# Patient Record
Sex: Male | Born: 1975 | Race: White | Hispanic: No | Marital: Single | Smoking: Current every day smoker
Health system: Southern US, Community
[De-identification: ages and names within clinical notes are randomized; demographics above are authoritative.]

## PROBLEM LIST (undated history)

## (undated) DIAGNOSIS — M47812 Spondylosis without myelopathy or radiculopathy, cervical region: Secondary | ICD-10-CM

## (undated) DIAGNOSIS — J45909 Unspecified asthma, uncomplicated: Secondary | ICD-10-CM

## (undated) DIAGNOSIS — B019 Varicella without complication: Secondary | ICD-10-CM

## (undated) DIAGNOSIS — R51 Headache: Secondary | ICD-10-CM

## (undated) DIAGNOSIS — K219 Gastro-esophageal reflux disease without esophagitis: Secondary | ICD-10-CM

## (undated) DIAGNOSIS — K8000 Calculus of gallbladder with acute cholecystitis without obstruction: Secondary | ICD-10-CM

## (undated) DIAGNOSIS — G43909 Migraine, unspecified, not intractable, without status migrainosus: Secondary | ICD-10-CM

## (undated) DIAGNOSIS — R519 Headache, unspecified: Secondary | ICD-10-CM

## (undated) DIAGNOSIS — M549 Dorsalgia, unspecified: Secondary | ICD-10-CM

## (undated) DIAGNOSIS — G8929 Other chronic pain: Secondary | ICD-10-CM

## (undated) HISTORY — DX: Headache, unspecified: R51.9

## (undated) HISTORY — DX: Dorsalgia, unspecified: M54.9

## (undated) HISTORY — DX: Unspecified asthma, uncomplicated: J45.909

## (undated) HISTORY — DX: Headache: R51

## (undated) HISTORY — DX: Spondylosis without myelopathy or radiculopathy, cervical region: M47.812

## (undated) HISTORY — DX: Migraine, unspecified, not intractable, without status migrainosus: G43.909

## (undated) HISTORY — DX: Calculus of gallbladder with acute cholecystitis without obstruction: K80.00

## (undated) HISTORY — DX: Varicella without complication: B01.9

## (undated) HISTORY — PX: NO PAST SURGERIES: SHX2092

## (undated) HISTORY — DX: Other chronic pain: G89.29

---

## 2005-04-18 ENCOUNTER — Emergency Department (HOSPITAL_COMMUNITY): Admission: EM | Admit: 2005-04-18 | Discharge: 2005-04-18 | Payer: Self-pay | Admitting: Emergency Medicine

## 2005-12-06 ENCOUNTER — Emergency Department: Payer: Self-pay | Admitting: Emergency Medicine

## 2006-06-19 ENCOUNTER — Emergency Department: Payer: Self-pay | Admitting: Emergency Medicine

## 2006-11-29 ENCOUNTER — Ambulatory Visit: Payer: Self-pay | Admitting: Internal Medicine

## 2006-12-04 ENCOUNTER — Emergency Department: Payer: Self-pay | Admitting: Unknown Physician Specialty

## 2008-05-28 ENCOUNTER — Other Ambulatory Visit: Payer: Self-pay

## 2008-05-28 ENCOUNTER — Emergency Department: Payer: Self-pay | Admitting: Unknown Physician Specialty

## 2008-05-30 ENCOUNTER — Emergency Department: Payer: Self-pay | Admitting: Emergency Medicine

## 2009-08-05 ENCOUNTER — Emergency Department: Payer: Self-pay | Admitting: Emergency Medicine

## 2010-04-14 ENCOUNTER — Emergency Department (HOSPITAL_COMMUNITY): Admission: EM | Admit: 2010-04-14 | Discharge: 2010-04-14 | Payer: Self-pay | Admitting: Emergency Medicine

## 2010-04-27 ENCOUNTER — Emergency Department: Payer: Self-pay | Admitting: Emergency Medicine

## 2010-05-01 ENCOUNTER — Emergency Department: Payer: Self-pay | Admitting: Internal Medicine

## 2010-07-15 ENCOUNTER — Emergency Department: Payer: Self-pay | Admitting: Emergency Medicine

## 2010-08-18 ENCOUNTER — Emergency Department: Payer: Self-pay | Admitting: Emergency Medicine

## 2011-05-29 ENCOUNTER — Emergency Department: Payer: Self-pay | Admitting: Emergency Medicine

## 2011-06-05 ENCOUNTER — Emergency Department: Payer: Self-pay | Admitting: Internal Medicine

## 2015-11-18 ENCOUNTER — Emergency Department
Admission: EM | Admit: 2015-11-18 | Discharge: 2015-11-18 | Disposition: A | Discharge status: Home / Self Care | Payer: Self-pay | Attending: Emergency Medicine | Admitting: Emergency Medicine

## 2015-11-18 DIAGNOSIS — K047 Periapical abscess without sinus: Secondary | ICD-10-CM | POA: Insufficient documentation

## 2015-11-18 DIAGNOSIS — K029 Dental caries, unspecified: Secondary | ICD-10-CM | POA: Insufficient documentation

## 2015-11-18 MED ORDER — OXYCODONE-ACETAMINOPHEN 5-325 MG PO TABS
1.0000 | ORAL_TABLET | Freq: Once | ORAL | Status: AC
  Administered 2015-11-18: 1 via ORAL
  Filled 2015-11-18: qty 1

## 2015-11-18 MED ORDER — CEPHALEXIN 500 MG PO CAPS
500.0000 mg | ORAL_CAPSULE | Freq: Once | ORAL | Status: AC
  Administered 2015-11-18: 500 mg via ORAL
  Filled 2015-11-18: qty 1

## 2015-11-18 MED ORDER — CEPHALEXIN 500 MG PO CAPS: 500.0000 mg | ORAL_CAPSULE | Freq: Two times a day (BID) | ORAL | Status: AC

## 2015-11-18 MED ORDER — OXYCODONE-ACETAMINOPHEN 5-325 MG PO TABS: 1.0000 | ORAL_TABLET | ORAL | Status: DC | PRN

## 2015-11-18 NOTE — Discharge Instructions (Signed)

## 2015-11-18 NOTE — ED Notes (Signed)
MD at bedside. 

## 2015-11-18 NOTE — ED Notes (Signed)
Tooth ache

## 2015-11-18 NOTE — ED Notes (Signed)
Pt alert and oriented X4, active, cooperative, pt in NAD. RR even and unlabored, color WNL.  Pt informed to return if any life threatening symptoms occur.   

## 2015-11-18 NOTE — ED Provider Notes (Signed)
[facility] Emergency Department Provider Note  ____________________________________________  Time seen: 3:15 AM  I have reviewed the triage vital signs and the nursing notes.   HISTORY  Chief Complaint Dental Pain     HPI Zachary Khan is a 40 y.o. male presents with toothache x few days patient states that he feels as though his face is now swelling as a result. Patient denies any fever no difficulty swallowing no neck pain    Past medical history None There are no active problems to display for this patient.   Past Surgical History None  Current Outpatient Rx  Name  Route  Sig  Dispense  Refill  . cephALEXin (KEFLEX) 500 MG capsule   Oral   Take 1 capsule (500 mg total) by mouth 2 (two) times daily.   20 capsule   0   . oxyCODONE-acetaminophen (PERCOCET/ROXICET) 5-325 MG tablet   Oral   Take 1 tablet by mouth every 4 (four) hours as needed for severe pain.   15 tablet   0     Allergies Sulfa antibiotics  No family history on file.  Social History Social History  Substance Use Topics  . Smoking status: Not on file  . Smokeless tobacco: Not on file  . Alcohol Use: Not on file    Review of Systems  Constitutional: Negative for fever. Eyes: Negative for visual changes. ENT: Negative for sore throat. Positive for toothache Cardiovascular: Negative for chest pain. Respiratory: Negative for shortness of breath. Gastrointestinal: Negative for abdominal pain, vomiting and diarrhea. Genitourinary: Negative for dysuria. Musculoskeletal: Negative for back pain. Skin: Negative for rash. Neurological: Negative for headaches, focal weakness or numbness.   10-point ROS otherwise negative.  ____________________________________________   PHYSICAL EXAM:  VITAL SIGNS: ED Triage Vitals  Enc Vitals Group     BP 11/18/15 0157 165/105 mmHg     Pulse Rate 11/18/15 0157 90     Resp 11/18/15 0157 18     Temp 11/18/15 0157 98.6 F (37  C)     Temp Source 11/18/15 0157 Oral     SpO2 11/18/15 0157 96 %     Weight 11/18/15 0157 190 lb (86.183 kg)     Height 11/18/15 0157  (1.753 m)     Head Cir --      Peak Flow --      Pain Score 11/18/15 0157 9     Pain Loc --      Pain Edu? --      Excl. in GC? --      Constitutional: Alert and oriented. Well appearing and in no distress. Eyes: Conjunctivae are normal. PERRL. Normal extraocular movements. ENT   Head: Normocephalic and atraumatic.   Nose: No congestion/rhinnorhea.   Mouth/Throat: Mucous membranes are moist. Positive for dental caries   Neck: No stridor. Hematological/Lymphatic/Immunilogical: No cervical lymphadenopathy. Cardiovascular: Normal rate, regular rhythm. Normal and symmetric distal pulses are present in all extremities. No murmurs, rubs, or gallops. Respiratory: Normal respiratory effort without tachypnea nor retractions. Breath sounds are clear and equal bilaterally. No wheezes/rales/rhonchi. Gastrointestinal: Soft and nontender. No distention. There is no CVA tenderness. Genitourinary: deferred Musculoskeletal: Nontender with normal range of motion in all extremities. No joint effusions.  No lower extremity tenderness nor edema. Neurologic:  Normal speech and language. No gross focal neurologic deficits are appreciated. Speech is normal.  Skin:  Skin is warm, dry and intact. No rash noted.  _____     INITIAL IMPRESSION / ASSESSMENT  AND PLAN / ED COURSE  Pertinent labs & imaging results that were available during my care of the patient were reviewed by me and considered in my medical decision making (see chart for details).    ____________________________________________   FINAL CLINICAL IMPRESSION(S) / ED DIAGNOSES  Final diagnoses:  Dental abscess      Darci Current, MD 11/18/15 [phone]

## 2016-02-07 ENCOUNTER — Emergency Department
Admission: EM | Admit: 2016-02-07 | Discharge: 2016-02-07 | Disposition: A | Discharge status: Home / Self Care | Payer: Self-pay | Attending: Emergency Medicine | Admitting: Emergency Medicine

## 2016-02-07 ENCOUNTER — Emergency Department: Payer: Self-pay

## 2016-02-07 DIAGNOSIS — N50811 Right testicular pain: Secondary | ICD-10-CM | POA: Insufficient documentation

## 2016-02-07 DIAGNOSIS — N5089 Other specified disorders of the male genital organs: Secondary | ICD-10-CM

## 2016-02-07 LAB — URINALYSIS COMPLETE WITH MICROSCOPIC (ARMC ONLY)
Bilirubin Urine: NEGATIVE
GLUCOSE, UA: NEGATIVE mg/dL
Ketones, ur: NEGATIVE mg/dL
Leukocytes, UA: NEGATIVE
Nitrite: NEGATIVE
Protein, ur: NEGATIVE mg/dL
Specific Gravity, Urine: 1.013 (ref 1.005–1.030)
Squamous Epithelial / LPF: NONE SEEN
pH: 7 (ref 5.0–8.0)

## 2016-02-07 MED ORDER — HYDROCODONE-ACETAMINOPHEN 5-325 MG PO TABS: 1.0000 | ORAL_TABLET | Freq: Four times a day (QID) | ORAL | Status: DC | PRN

## 2016-02-07 MED ORDER — ONDANSETRON 4 MG PO TBDP
4.0000 mg | ORAL_TABLET | Freq: Once | ORAL | Status: AC
  Administered 2016-02-07: 4 mg via ORAL
  Filled 2016-02-07: qty 1

## 2016-02-07 MED ORDER — HYDROMORPHONE HCL 1 MG/ML IJ SOLN
1.0000 mg | Freq: Once | INTRAMUSCULAR | Status: AC
  Administered 2016-02-07: 1 mg via INTRAMUSCULAR
  Filled 2016-02-07: qty 1

## 2016-02-07 MED ORDER — DOXYCYCLINE HYCLATE 50 MG PO CAPS: 50.0000 mg | ORAL_CAPSULE | Freq: Two times a day (BID) | ORAL | Status: DC

## 2016-02-07 NOTE — ED Provider Notes (Signed)
[facility] Emergency Department Provider Note  __________________________________________  Time seen: Approximately 10:09 AM  I have reviewed the triage vital signs and the nursing notes.   HISTORY  Chief Complaint Testicle Pain  HPI Zachary Khan is a 40 y.o. male who is complaining of severe pain in his right testicle that started Friday evening and this gotten progressively worse. Patient states that he has had no injury and no associated abdominal pain as well. Patient started being concerned that he may have a testicular torsion. Patient has not had any significant discharge or urinary symptoms. He also denies any fever or chills or nausea or vomiting. Patient states his pain on scale of 0-10 is a 10. Patient also has no significant swelling to the testicle.   No past medical history on file.  There are no active problems to display for this patient.   No past surgical history on file.  Current Outpatient Rx  Name  Route  Sig  Dispense  Refill  . oxyCODONE-acetaminophen (PERCOCET/ROXICET) 5-325 MG tablet   Oral   Take 1 tablet by mouth every 4 (four) hours as needed for severe pain.   15 tablet   0     Allergies Sulfa antibiotics  No family history on file.  Social History Social History  Substance Use Topics  . Smoking status: Not on file  . Smokeless tobacco: Not on file  . Alcohol Use: Not on file    Review of Systems Constitutional: No fever/chills Eyes: No visual changes. ENT: No sore throat. Cardiovascular: Denies chest pain. Respiratory: Denies shortness of breath. Gastrointestinal: No abdominal pain.  No nausea, no vomiting.  No diarrhea.  No constipation. Genitourinary: Negative for dysuria.Positive for severe pain in his right testicle. Musculoskeletal: Negative for back pain. Skin: Negative for rash. Neurological: Negative for headaches, focal weakness or numbness.  10-point ROS otherwise  negative.  ____________________________________________   PHYSICAL EXAM:  VITAL SIGNS: ED Triage Vitals  Enc Vitals Group     BP 02/07/16 0911 132/82 mmHg     Pulse Rate 02/07/16 0911 84     Resp 02/07/16 0911 20     Temp 02/07/16 0911 97.8 F (36.6 C)     Temp Source 02/07/16 0911 Oral     SpO2 02/07/16 0911 99 %     Weight 02/07/16 0911 181 lb (82.101 kg)     Height 02/07/16 0911  (1.753 m)     Head Cir --      Peak Flow --      Pain Score 02/07/16 0913 7     Pain Loc --      Pain Edu? --      Excl. in GC? --     Constitutional: Alert and oriented. Well appearing and in Mild acute distress secondary to his pain. Eyes: Conjunctivae are normal. PERRL. EOMI. Head: Atraumatic. Nose: No congestion/rhinnorhea. Mouth/Throat: Mucous membranes are moist.  Oropharynx non-erythematous. Neck: No stridor.   Cardiovascular: Normal rate, regular rhythm. Grossly normal heart sounds.  Good peripheral circulation. Respiratory: Normal respiratory effort.  No retractions. Lungs CTAB. Gastrointestinal: Soft and nontender. No distention. No abdominal bruits. No CVA tenderness. Genitourinary: Patient has tenderness palpation along his right testicle and epididymis, but no significant swelling, no inguinal lymphadenopathy, no redness, or any other acute findings. Musculoskeletal: No lower extremity tenderness nor edema.  No joint effusions. Neurologic:  Normal speech and language. No gross focal neurologic deficits are appreciated. No gait instability. Skin:  Skin is warm,  dry and intact. No rash noted. Psychiatric: Mood and affect are normal. Speech and behavior are normal.  ____________________________________________   LABS (all labs ordered are listed, but only abnormal results are displayed)  Labs Reviewed  URINALYSIS COMPLETEWITH MICROSCOPIC ([facility] ONLY) - Abnormal; Notable for the following:    Color, Urine YELLOW (*)    APPearance CLEAR (*)    Hgb urine dipstick 1+ (*)     Bacteria, UA RARE (*)    All other components within normal limits   ____________________________________________  EKG   ____________________________________________  RADIOLOGY  [place] Scrotum  02/07/2016  CLINICAL DATA:  Testicular swelling for 1.5 days. EXAM: SCROTAL ULTRASOUND DOPPLER ULTRASOUND OF THE TESTICLES TECHNIQUE: Complete ultrasound examination of the testicles, epididymis, and other scrotal structures was performed. Color and spectral Doppler ultrasound were also utilized to evaluate blood flow to the testicles. COMPARISON:  CT of 08/18/2010 FINDINGS: Right testicle Measurements: 4.4 x 2.8 x 3.2 cm.  Normal in morphology. Left testicle Measurements:  4.5 x 2.4 x 3.3 cm.  Normal in morphology. Right epididymis:  Normal in size and appearance. Left epididymis:  Normal in size and appearance. Hydrocele:  Small bilateral hydroceles, likely physiologic. Varicocele:  Absent Pulsed Doppler interrogation of both testes demonstrates normal low resistance arterial and venous waveforms bilaterally. Normal and symmetric color signal bilaterally. IMPRESSION: 1. No evidence of testicular torsion or explanation for swelling. 2. Small bilateral hydroceles, favored to be physiologic. Electronically Signed   By: Jeronimo GreavesKyle  Talbot M.D.   On: 02/07/2016 10:43   [place] Art/ven Flow Abd Pelv Doppler  02/07/2016  CLINICAL DATA:  Testicular swelling for 1.5 days. EXAM: SCROTAL ULTRASOUND DOPPLER ULTRASOUND OF THE TESTICLES TECHNIQUE: Complete ultrasound examination of the testicles, epididymis, and other scrotal structures was performed. Color and spectral Doppler ultrasound were also utilized to evaluate blood flow to the testicles. COMPARISON:  CT of 08/18/2010 FINDINGS: Right testicle Measurements: 4.4 x 2.8 x 3.2 cm.  Normal in morphology. Left testicle Measurements:  4.5 x 2.4 x 3.3 cm.  Normal in morphology. Right epididymis:  Normal in size and appearance. Left epididymis:  Normal in size and appearance. Hydrocele:   Small bilateral hydroceles, likely physiologic. Varicocele:  Absent Pulsed Doppler interrogation of both testes demonstrates normal low resistance arterial and venous waveforms bilaterally. Normal and symmetric color signal bilaterally. IMPRESSION: 1. No evidence of testicular torsion or explanation for swelling. 2. Small bilateral hydroceles, favored to be physiologic. Electronically Signed   By: Jeronimo GreavesKyle  Talbot M.D.   On: 02/07/2016 10:43    ____________________________________________   PROCEDURES  Procedure(s) performed: None  Critical Care performed: No  ____________________________________________   INITIAL IMPRESSION / ASSESSMENT AND PLAN / ED COURSE  Pertinent labs & imaging results that were available during my care of the patient were reviewed by me and considered in my medical decision making (see chart for details).  11:59 AM Patient will be given a shot for pain prior to discharge. Patient has small bilateral hydroceles but no other acute findings. Patient will be discharged home on doxycycline and Vicodin for pain and possible infection that may not show that on ultrasound. Patient is going to be referred to urology, Dr. Annabell HowellsWrenn, for follow-up this week. ____________________________________________   FINAL CLINICAL IMPRESSION(S) / ED DIAGNOSES  Final diagnoses:  Right testicular pain      Leona CarryLinda M Belal Scallon, MD 02/07/16 1159

## 2016-02-07 NOTE — ED Notes (Addendum)
Pain and swelling to right testicle since Friday. Pt denies injury. Denies fever.

## 2016-02-07 NOTE — ED Notes (Signed)
Pt verbalized understanding of discharge instructions. NAD at this time. 

## 2016-02-07 NOTE — ED Notes (Addendum)
Patient transported to Ultrasound 

## 2016-05-23 ENCOUNTER — Emergency Department
Admission: EM | Admit: 2016-05-23 | Discharge: 2016-05-23 | Disposition: A | Discharge status: Home / Self Care | Payer: PRIVATE HEALTH INSURANCE | Attending: Emergency Medicine | Admitting: Emergency Medicine

## 2016-05-23 ENCOUNTER — Emergency Department: Payer: PRIVATE HEALTH INSURANCE

## 2016-05-23 DIAGNOSIS — M545 Low back pain: Secondary | ICD-10-CM | POA: Diagnosis present

## 2016-05-23 DIAGNOSIS — X58XXXA Exposure to other specified factors, initial encounter: Secondary | ICD-10-CM | POA: Diagnosis not present

## 2016-05-23 DIAGNOSIS — S161XXA Strain of muscle, fascia and tendon at neck level, initial encounter: Secondary | ICD-10-CM | POA: Insufficient documentation

## 2016-05-23 DIAGNOSIS — Y939 Activity, unspecified: Secondary | ICD-10-CM | POA: Insufficient documentation

## 2016-05-23 DIAGNOSIS — Y999 Unspecified external cause status: Secondary | ICD-10-CM | POA: Insufficient documentation

## 2016-05-23 DIAGNOSIS — S39012A Strain of muscle, fascia and tendon of lower back, initial encounter: Secondary | ICD-10-CM | POA: Diagnosis not present

## 2016-05-23 DIAGNOSIS — Y929 Unspecified place or not applicable: Secondary | ICD-10-CM | POA: Insufficient documentation

## 2016-05-23 DIAGNOSIS — T148XXA Other injury of unspecified body region, initial encounter: Secondary | ICD-10-CM

## 2016-05-23 MED ORDER — DIAZEPAM 5 MG/ML IJ SOLN
5.0000 mg | Freq: Once | INTRAMUSCULAR | Status: AC
  Administered 2016-05-23: 5 mg via INTRAMUSCULAR
  Filled 2016-05-23: qty 2

## 2016-05-23 MED ORDER — NAPROXEN 500 MG PO TABS: 500.0000 mg | ORAL_TABLET | Freq: Two times a day (BID) | ORAL | 0 refills | Status: DC

## 2016-05-23 MED ORDER — METHOCARBAMOL 750 MG PO TABS: 750.0000 mg | ORAL_TABLET | Freq: Four times a day (QID) | ORAL | 0 refills | Status: DC

## 2016-05-23 MED ORDER — OXYCODONE-ACETAMINOPHEN 5-325 MG PO TABS
2.0000 | ORAL_TABLET | Freq: Once | ORAL | Status: AC
  Administered 2016-05-23: 2 via ORAL
  Filled 2016-05-23: qty 2

## 2016-05-23 MED ORDER — KETOROLAC TROMETHAMINE 60 MG/2ML IM SOLN
60.0000 mg | Freq: Once | INTRAMUSCULAR | Status: AC
  Administered 2016-05-23: 60 mg via INTRAMUSCULAR
  Filled 2016-05-23: qty 2

## 2016-05-23 NOTE — ED Provider Notes (Signed)
[facility] Emergency Department Provider Note  ____________________________________________  Time seen: Approximately 2:31 PM  I have reviewed the triage vital signs and the nursing notes.   HISTORY  Chief Complaint Back Pain    HPI Zachary Khan is a 40 y.o. male who presents for evaluation of low back pain and cervical neck pain. Patient denies any trauma. States that the pain has followed a migraine that he had earlier last week 6 pain radiates down his right arm and down his right leg. Describes pain as 10 over 10 with no relief.   No past medical history on file.  There are no active problems to display for this patient.   No past surgical history on file.  Prior to Admission medications   Medication Sig Start Date End Date Taking? Authorizing Provider  methocarbamol (ROBAXIN) 750 MG tablet Take 1 tablet (750 mg total) by mouth 4 (four) times daily. 05/23/16   Evangeline Dakin, PA-C  naproxen (NAPROSYN) 500 MG tablet Take 1 tablet (500 mg total) by mouth 2 (two) times daily with a meal. 05/23/16   Evangeline Dakin, PA-C    Allergies Sulfa antibiotics  No family history on file.  Social History Social History  Substance Use Topics  . Smoking status: Not on file  . Smokeless tobacco: Not on file  . Alcohol use Not on file    Review of Systems Constitutional: No fever/chills Cardiovascular: Denies chest pain. Respiratory: Denies shortness of breath. Genitourinary: Negative for dysuria. Musculoskeletal: Positive for cervical neck pain and positive for lumbar back pain. Skin: Negative for rash. Neurological: Negative for headaches, focal weakness or numbness.  10-point ROS otherwise negative.  ____________________________________________   PHYSICAL EXAM:  VITAL SIGNS: ED Triage Vitals  Enc Vitals Group     BP 05/23/16 1417 134/87     Pulse Rate 05/23/16 1417 81     Resp 05/23/16 1417 17     Temp 05/23/16 1417 97.8 F (36.6 C)     Temp Source 05/23/16 1417 Oral     SpO2 05/23/16 1417 96 %     Weight 05/23/16 1417 180 lb (81.6 kg)     Height 05/23/16 1417  (1.753 m)     Head Circumference --      Peak Flow --      Pain Score 05/23/16 1420 8     Pain Loc --      Pain Edu? --      Excl. in GC? --     Constitutional: Alert and oriented. Well appearing and in no acute distress. Mouth/Throat: Mucous membranes are moist.  Oropharynx non-erythematous. Neck: No stridor.Supple full range of motion point tenderness noted to the lower cervical spine area.   Cardiovascular: Normal rate, regular rhythm. Grossly normal heart sounds.  Good peripheral circulation. Respiratory: Normal respiratory effort.  No retractions. Lungs CTAB. Gastrointestinal: Soft and nontender. No distention. No abdominal bruits. No CVA tenderness. Musculoskeletal: No lower extremity tenderness nor edema.  No joint effusions. Neurologic:  Normal speech and language. No gross focal neurologic deficits are appreciated. No gait instability. Skin:  Skin is warm, dry and intact. No rash noted. Psychiatric: Mood and affect are normal. Speech and behavior are normal.  ____________________________________________   LABS (all labs ordered are listed, but only abnormal results are displayed)  Labs Reviewed - No data to display ____________________________________________  EKG   ____________________________________________  RADIOLOGY   ____________________________________________   PROCEDURES  Procedure(s) performed: None  Critical Care performed: No  ____________________________________________   INITIAL IMPRESSION / ASSESSMENT AND PLAN / ED COURSE  Pertinent labs & imaging results that were available during my care of the patient were reviewed by me and considered in my medical decision making (see chart for details).  Acute cervical and lumbar strain. Rx given for Robaxin 750 and Naprosyn 500. Patient follow-up PCP or return to ER  with any worsening symptomology. Work excuse 24 hours given.  Clinical Course    ____________________________________________   FINAL CLINICAL IMPRESSION(S) / ED DIAGNOSES  Final diagnoses:  Muscle strain  Lumbar strain, initial encounter  Cervical strain, acute, initial encounter     This chart was dictated using voice recognition software/Dragon. Despite best efforts to proofread, errors can occur which can change the meaning. Any change was purely unintentional.    Evangeline Dakin, PA-C 05/23/16 1723    Phineas Semen, MD 05/23/16 1740

## 2016-05-23 NOTE — ED Triage Notes (Signed)
Pt reports lower back pain and right arm numbness since this morning. Pt with hx of chronic back pain. Reports "spinal migraine" started on Thursday.

## 2016-06-01 ENCOUNTER — Encounter: Payer: Self-pay | Admitting: Primary Care

## 2016-06-01 ENCOUNTER — Ambulatory Visit (INDEPENDENT_AMBULATORY_CARE_PROVIDER_SITE_OTHER): Payer: PRIVATE HEALTH INSURANCE | Admitting: Primary Care

## 2016-06-01 VITALS — BP 142/84 | HR 72 | Temp 98.0°F | Ht 69.0 in | Wt 183.4 lb

## 2016-06-01 DIAGNOSIS — G8929 Other chronic pain: Secondary | ICD-10-CM

## 2016-06-01 DIAGNOSIS — M542 Cervicalgia: Secondary | ICD-10-CM

## 2016-06-01 DIAGNOSIS — G43909 Migraine, unspecified, not intractable, without status migrainosus: Secondary | ICD-10-CM | POA: Insufficient documentation

## 2016-06-01 DIAGNOSIS — R51 Headache: Secondary | ICD-10-CM

## 2016-06-01 DIAGNOSIS — M549 Dorsalgia, unspecified: Secondary | ICD-10-CM | POA: Diagnosis not present

## 2016-06-01 DIAGNOSIS — R519 Headache, unspecified: Secondary | ICD-10-CM

## 2016-06-01 DIAGNOSIS — G43901 Migraine, unspecified, not intractable, with status migrainosus: Secondary | ICD-10-CM

## 2016-06-01 DIAGNOSIS — G43701 Chronic migraine without aura, not intractable, with status migrainosus: Secondary | ICD-10-CM

## 2016-06-01 MED ORDER — PREDNISONE 10 MG PO TABS: ORAL_TABLET | ORAL | 0 refills | Status: DC

## 2016-06-01 MED ORDER — CYCLOBENZAPRINE HCL 10 MG PO TABS: 10.0000 mg | ORAL_TABLET | Freq: Three times a day (TID) | ORAL | 0 refills | Status: DC | PRN

## 2016-06-01 MED ORDER — SUMATRIPTAN SUCCINATE 50 MG PO TABS: ORAL_TABLET | ORAL | 0 refills | Status: DC

## 2016-06-01 MED ORDER — AMITRIPTYLINE HCL 25 MG PO TABS
25.0000 mg | ORAL_TABLET | Freq: Every day | ORAL | 1 refills | Status: DC
Start: 2016-06-01 — End: 2016-06-22

## 2016-06-01 NOTE — Assessment & Plan Note (Addendum)
Ongoing for years. Once managed by neurosurgery in [place]. Recent CT with mild degeneration, otherwise unremarkable. He is in moderate discomfort today. Given radicular symptoms and presentation will treat with short-term prednisone taper and Flexeril. Stop Robaxin and all NSAIDs for now. Work note provided to return to work Friday night if dizziness has resolved. Referral placed to neurosurgery for further evaluation given chronicity.

## 2016-06-01 NOTE — Assessment & Plan Note (Signed)
Long history of in the past, induced by chronic back pain per patient. He is never been managed on preventative treatment as he always sought acute treatment in the emergency department. Will start low-dose amitriptyline at bedtime to help with migraine prevention. Prescription for Imitrex sent to pharmacy to use only as needed for severe migraines. Follow-up in 6 weeks for reevaluation.

## 2016-06-01 NOTE — Assessment & Plan Note (Signed)
Ongoing for years. Once managing with neurosurgery in [place]. Recent CT with spurring and foraminal narrowing to cervical spine. Stop Robaxin and naproxen. Start Flexeril as needed. Referral placed to neurosurgery for further evaluation given chronicity.

## 2016-06-01 NOTE — Progress Notes (Signed)
Subjective:    Patient ID: Zachary Khan, male    DOB: 08/12/1976, 40 y.o.   MRN: [id number]  HPI  Zachary Khan is a 40 year old male who presents today to establish care and for hospital follow up.  1) Hospital Follow Up: Presented to [facility] ED on 07/31 with a chief complaint of Lumbar and cervical pain. His lumbar pain is located to the mid lower back.  He denied any recent trauma. He has a history of chronic neck and back pain. He was once following with a neurosurgeon in [place]. Historically his cervical neck pain will induce migraines for which she has suffered for years.  During his stay in the ED he underwent imaging with CT of his lumbar and cervical spine. His Lumbar CT showed mild degenerative changes, negative for disc protrusion. His cervical CT showed mild foraminal narrowing to C3-4 with spurring and left foraminal narrowing at C5-6 and C6-7 with spurring. He was provided with a prescription for Robaxin 750 mg and Naproxen 500 mg and told to follow up with PCP.   Since his visit to the emergency department he's not noticed much improvement. He believes that the Naproxen and Robaxin are making his pain worse. His pain to the lower back is located to the middle. He's been taking aleve scheduled daily. His neck pain will induce migraines that will last several days. 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   He experiences intermittent numbness to bilateral lower extremities. He is also requesting a work note starting Wednesday last week as he has been out of work due to symptoms of dizziness caused by Robaxin.  2) Migraines/Frequent Headaches: Long history of migraines for years. Worse with increased neck pain. He will experience migraines to his occipital lobe with radiation up to the frontal lobes. He will experience migraines once weekly and will experience nausea, photophobia, and vomiting. He's never had treatment for migraines/frequent headaches. He will  typically go to the emergency department for injections for treatment of migraines.   Review of Systems  Constitutional: Negative for unexpected weight change.  Eyes: Negative for photophobia.  Respiratory: Negative for shortness of breath.   Cardiovascular: Negative for chest pain.  Gastrointestinal: Negative for nausea.  Genitourinary: Negative for difficulty urinating.       No loss of bowel or bladder control  Musculoskeletal: Positive for back pain and neck pain.  Skin: Negative for color change.  Neurological: Positive for dizziness, weakness and headaches.  Hematological: Does not bruise/bleed easily.       Past Medical History:  Diagnosis Date  . Asthma   . Cervical spine degeneration   . Chicken pox   . Chronic back pain   . Frequent headaches   . Migraine      Social History   Social History  . Marital status: Divorced    Spouse name: N/A  . Number of children: N/A  . Years of education: N/A   Occupational History  . Not on file.   Social History Main Topics  . Smoking status: Current Every Day Smoker    Packs/day: 1.00    Types: Cigarettes  . Smokeless tobacco: Never Used  . Alcohol use Yes     Comment: soical  . Drug use: Unknown  . Sexual activity: Not on file   Other Topics Concern  . Not on file   Social History Narrative   Single.   4 children.   Works as  a Retail banker.   Highest level of education: GED.       No past surgical history on file.  Family History  Problem Relation Age of Onset  . Arthritis Mother   . Heart disease Mother   . Hypertension Mother   . Alcohol abuse Father   . ALS Father   . Rheum arthritis Maternal Grandmother     Allergies  Allergen Reactions  . Sulfa Antibiotics Other (See Comments)    Current Outpatient Prescriptions on File Prior to Visit  Medication Sig Dispense Refill  . naproxen (NAPROSYN) 500 MG tablet Take 1 tablet (500 mg total) by mouth 2 (two) times daily with a meal. 60 tablet  0   No current facility-administered medications on file prior to visit.     BP (!) 142/84   Pulse 72   Temp 98 F (36.7 C) (Oral)   Ht  (1.753 m)   Wt 183 lb 6.4 oz (83.2 kg)   SpO2 97%   BMI 27.08 kg/m    Objective:   Physical Exam  Constitutional: He is oriented to person, place, and time. He appears well-nourished.  Appears uncomfortable in exam room due back and neck pain.  Eyes: EOM are normal. Pupils are equal, round, and reactive to light.  Neck: Neck supple.  Decreased range of motion to neck with all movements.  Cardiovascular: Normal rate and regular rhythm.   Pulmonary/Chest: Effort normal and breath sounds normal. He has no wheezes. He has no rales.  Musculoskeletal:       Lumbar back: He exhibits decreased range of motion, tenderness and pain.  Neurological: He is alert and oriented to person, place, and time.  Skin: Skin is warm and dry.  Psychiatric: He has a normal mood and affect.          Assessment & Plan:  Emergency Department follow up:  Presented to the emergency department on July 31 with complaints of chronic neck and back pain. Underwent CT imaging with evidence of spurring and foraminal narrowing to cervical spine. Lumbar spine with mild degenerative changes, otherwise unremarkable. Overall feels that Robaxin and naproxen are causing his pain to increase. Robaxin causing dizziness that has kept him out of work.  Given results from recent CT and long history of chronic pain, will send him to neurosurgeon for further evaluation.  Will also stop Robaxin and switch to Flexeril as this has been helpful in the past. We'll also provide him with short taper of steroids to help with radicular symptoms. Discussed to stop naproxen and all NSAIDs well taking prednisone.  All Hospital labs, imaging, notes reviewed. Morrie Sheldon, NP  >45 minutes spent face to face with patient, >50% spent counseling or coordinating care.

## 2016-06-01 NOTE — Patient Instructions (Signed)
Stop Methocarbamol and Naproxen tablets for now. Stop Aleve.  Start prednisone tablets. Take three tablets for 2 days, then two tablets for 2 days, then one tablet for 2 days.  Start Flexeril tablets for muscle spasms. Take 1 tablet by mouth three times daily as needed. You may cut these in half if you experience drowsiness.  Start Amitriptyline 25 mg tablets at bedtime every night for migraine prevention.  You may use the Imitrex as needed for severe migraines. Take 1 tablet by mouth at migraine onset. May repeat in 2 hours if migraine persists. Do not exceed 2 tablets in 24 hours.  Follow up in 6 weeks for re-evaluation of back, neck pain and migraines.  It was a pleasure to meet you today! Please don't hesitate to call me with any questions. Welcome to [facility]!

## 2016-06-01 NOTE — Progress Notes (Signed)
Pre visit review using our clinic review tool, if applicable. No additional management support is needed unless otherwise documented below in the visit note. 

## 2016-06-06 ENCOUNTER — Encounter: Payer: Self-pay | Admitting: Primary Care

## 2016-06-06 ENCOUNTER — Telehealth: Payer: Self-pay | Admitting: Primary Care

## 2016-06-06 NOTE — Telephone Encounter (Signed)
I have not received the fax.

## 2016-06-06 NOTE — Telephone Encounter (Signed)
Jessica @ [employer] called stating the faxed over work note and need confirmation about this Fax [phone]  She wanted to make you receive this and please reply back asap

## 2016-06-07 NOTE — Telephone Encounter (Signed)
Left message for Zachary Khan to let her know we didn't received paperwork and ask her to refax.   I didn't leave pt name on voicemail.  Only that we didn't received paperwork she was asking for

## 2016-06-22 ENCOUNTER — Telehealth: Payer: Self-pay

## 2016-06-22 DIAGNOSIS — R519 Headache, unspecified: Secondary | ICD-10-CM

## 2016-06-22 DIAGNOSIS — R51 Headache: Principal | ICD-10-CM

## 2016-06-22 MED ORDER — AMITRIPTYLINE HCL 50 MG PO TABS: 50.0000 mg | ORAL_TABLET | Freq: Every day | ORAL | 1 refills | Status: DC

## 2016-06-22 NOTE — Telephone Encounter (Signed)
Spoken and notified patient of Kate's comments. Patient verbalized understanding. 

## 2016-06-22 NOTE — Telephone Encounter (Signed)
Noted. New Rx for Amitriptyline 50 mg sent to pharmacy. Take 1 tablet by mouth every night at bedtime.

## 2016-06-22 NOTE — Telephone Encounter (Signed)
Pt left v/m; pt was seen 06/01/16; pt cannot get nortriptyline refilled until 06/29/16. Pt request new rx with increased dosage; pain is no better than when seen. Pt request cb.medicap.

## 2016-06-23 MED ORDER — TRAMADOL HCL 50 MG PO TABS: 50.0000 mg | ORAL_TABLET | Freq: Three times a day (TID) | ORAL | 0 refills | Status: DC | PRN

## 2016-06-23 NOTE — Telephone Encounter (Signed)
Please remind patient that I am very sorry to hear about his pain. I do not treat chronic back pain with narcotics. I will send in a temporary supply of Tramadol for him to use as needed for pain until he can be evaluated by the neurosurgeon.   Zachary Khan, please call in Tramadol 50 mg tablets. Take 1 tablet by mouth three times daily as needed for severe pain. #15, no refills.

## 2016-06-23 NOTE — Telephone Encounter (Signed)
Pt left v/m; pt got elavil rx on 06/22/16 but pt cried himself to sleep last night. Pt request med for pain. Pt request cb. Pt has appt with surgeon on 06/28/16. medicap.

## 2016-06-23 NOTE — Addendum Note (Signed)
Addended by: Tawnya CrookSAMBATH, [place] on: 06/23/2016 02:32 PM   Modules accepted: Orders

## 2016-06-23 NOTE — Telephone Encounter (Signed)
Spoken and notified patient of Kate's comments. Patient verbalized understanding. 

## 2016-06-23 NOTE — Telephone Encounter (Signed)
Called in medication to the pharmacy as instructed. 

## 2016-06-29 ENCOUNTER — Telehealth: Payer: Self-pay | Admitting: Primary Care

## 2016-06-29 NOTE — Telephone Encounter (Signed)
Received a faxed from patient's work regarding patient's restrictions. The fax asking to change restrictions.  However, cannot change restrictions until patient goes and see the Neurosurgery.  Called patient on 06/24/2016 and asked when he will go see the Neurosurgery. He stated that he has appointment on 06/28/2016.  Called patient on 06/29/2016 and asked for update from Neurosurgery. Patient stated that the neurosurgeon wrote him out of work until 07/12/2016. Notified patient that his job has been contacting us about his status. Patient stated that he has a note from the neurosurgeon to give to his workplace.

## 2016-07-05 ENCOUNTER — Other Ambulatory Visit: Payer: Self-pay | Admitting: Neurosurgery

## 2016-07-05 DIAGNOSIS — M5412 Radiculopathy, cervical region: Secondary | ICD-10-CM

## 2016-07-05 DIAGNOSIS — Q7649 Other congenital malformations of spine, not associated with scoliosis: Secondary | ICD-10-CM

## 2016-07-07 NOTE — Telephone Encounter (Addendum)
Please notify Shanda BumpsJessica that she needs to contact either the patient or the neurosurgeon regarding current restrictions as they are the ones who are requesting restrictions. I do not have information regarding his current restrictions.

## 2016-07-07 NOTE — Telephone Encounter (Signed)
Spoken and notified Zachary Khan of Kate's comments. Zachary Khan verbalized understanding.

## 2016-07-07 NOTE — Telephone Encounter (Signed)
Shanda BumpsJessica @ [employer] [phone]

## 2016-07-07 NOTE — Telephone Encounter (Signed)
Spoke chan  She instructed me to tell Shanda Bumpsjessica that we cannot tell her any information because of HIPPA.  That she needed to call the patient.   Shanda BumpsJessica [facility]  She wanted to know what his restrictions were.  She was upset no one called her while kate was out of the office. I again told Shanda BumpsJessica to talk to mr Merced

## 2016-07-11 ENCOUNTER — Other Ambulatory Visit: Payer: Self-pay | Admitting: *Deleted

## 2016-07-11 ENCOUNTER — Ambulatory Visit
Admission: RE | Admit: 2016-07-11 | Discharge: 2016-07-11 | Disposition: A | Discharge status: Home / Self Care | Payer: No Typology Code available for payment source | Source: Ambulatory Visit | Attending: Primary Care | Admitting: Primary Care

## 2016-07-11 ENCOUNTER — Ambulatory Visit
Admission: RE | Admit: 2016-07-11 | Discharge: 2016-07-11 | Disposition: A | Discharge status: Home / Self Care | Payer: No Typology Code available for payment source | Source: Ambulatory Visit | Attending: *Deleted | Admitting: *Deleted

## 2016-07-11 DIAGNOSIS — M25511 Pain in right shoulder: Secondary | ICD-10-CM

## 2016-07-11 DIAGNOSIS — M4800 Spinal stenosis, site unspecified: Secondary | ICD-10-CM

## 2016-07-11 DIAGNOSIS — M19011 Primary osteoarthritis, right shoulder: Secondary | ICD-10-CM | POA: Diagnosis not present

## 2016-07-11 DIAGNOSIS — M545 Low back pain: Secondary | ICD-10-CM | POA: Diagnosis present

## 2016-07-13 ENCOUNTER — Ambulatory Visit (INDEPENDENT_AMBULATORY_CARE_PROVIDER_SITE_OTHER): Payer: PRIVATE HEALTH INSURANCE | Admitting: Primary Care

## 2016-07-13 ENCOUNTER — Encounter: Payer: Self-pay | Admitting: Primary Care

## 2016-07-13 VITALS — BP 144/86 | HR 78 | Temp 98.2°F | Ht 69.0 in | Wt 199.4 lb

## 2016-07-13 DIAGNOSIS — M542 Cervicalgia: Secondary | ICD-10-CM

## 2016-07-13 DIAGNOSIS — IMO0002 Reserved for concepts with insufficient information to code with codable children: Secondary | ICD-10-CM

## 2016-07-13 DIAGNOSIS — G43701 Chronic migraine without aura, not intractable, with status migrainosus: Secondary | ICD-10-CM | POA: Diagnosis not present

## 2016-07-13 DIAGNOSIS — M549 Dorsalgia, unspecified: Secondary | ICD-10-CM | POA: Diagnosis not present

## 2016-07-13 DIAGNOSIS — G8929 Other chronic pain: Secondary | ICD-10-CM

## 2016-07-13 DIAGNOSIS — G43709 Chronic migraine without aura, not intractable, without status migrainosus: Secondary | ICD-10-CM | POA: Diagnosis not present

## 2016-07-13 MED ORDER — SUMATRIPTAN SUCCINATE 100 MG PO TABS: ORAL_TABLET | ORAL | 1 refills | Status: DC

## 2016-07-13 NOTE — Assessment & Plan Note (Addendum)
MRI pending for Friday this week. Following with neurologist. Suspect chronic pain attributing to insomnia, will try low dose Melatonin.

## 2016-07-13 NOTE — Assessment & Plan Note (Signed)
MRI pending for Friday this week. Following with neurology.

## 2016-07-13 NOTE — Progress Notes (Signed)
Pre visit review using our clinic review tool, if applicable. No additional management support is needed unless otherwise documented below in the visit note. 

## 2016-07-13 NOTE — Assessment & Plan Note (Signed)
No improvement with Amitriptline, continue for now. Increased dose of Imitrex to 100, discussed to use these sparingly. Hopeful that treatment of neck will decrease occurrence of migraines. Follow up with neurologist scheduled next week. MRI pending.

## 2016-07-13 NOTE — Progress Notes (Signed)
Subjective:    Patient ID: Zachary Khan, male    DOB: 01/09/1976, 40 y.o.   MRN: [id number]  HPI  Mr. Zachary Khan is a 40 year old male who presents today for follow up.  1) Chronic Back Pain: Referral placed to Neurology last visit for chronic back pain without improvement on prednisone, muscle relaxer, and narcotics (from the emergency department). He had undergone PT in the past and declined re-evaluation. Plain films of his lumbar spine revealed disc space narrowing at L5-S1.    Since his last visit he was evaluated by the neurosurgeon. He is scheduled for an MRI Friday morning and will follow up Monday next week for results. 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 He was placed on tylenol #3 by his neurologist but he hasn't noticed much improvement in his pain.  2) Migraines: Long history of since chronic neck pain. He was initiated on preventative treatment last visit with Amitriptyline 25 mg HS. Several weeks ago he updated us and requested an increase in dose given little effect. His dose was increased to 50 mg once nightly. He was also provided with a prescription for Imitrex.  Since his last visit he's not noticed any difference in his migraines. His migraines continue to appear 2-3 times weekly. He's using Imitrex each time he experiences a migraine and will have to take 2 tablets for improvement. His migraines are still located to the occipital lobe with radiation to parietal lobes. He experiences nausea, photophobia, phonophobia.  3) Insomnia: Long history of difficulty falling and staying asleep due to chronic neck and back pain. He will lay in bed 2-3 hours before falling asleep and will wake every 2-3 hours due to discomfort. He's not tried taking anything OTC for sleep.  Review of Systems  Respiratory: Negative for shortness of breath.   Cardiovascular: Negative for chest pain.  Musculoskeletal: Positive for arthralgias, back pain and neck  pain.  Neurological: Positive for headaches.  Psychiatric/Behavioral: Positive for sleep disturbance.       Past Medical History:  Diagnosis Date  . Asthma   . Cervical spine degeneration   . Chicken pox   . Chronic back pain   . Frequent headaches   . Migraine      Social History   Social History  . Marital status: Divorced    Spouse name: N/A  . Number of children: N/A  . Years of education: N/A   Occupational History  . Not on file.   Social History Main Topics  . Smoking status: Current Every Day Smoker    Packs/day: 1.00    Types: Cigarettes  . Smokeless tobacco: Never Used  . Alcohol use Yes     Comment: soical  . Drug use: Unknown  . Sexual activity: Not on file   Other Topics Concern  . Not on file   Social History Narrative   Single.   4 children.   Works as a Retail bankerservice technician.   Highest level of education: GED.       No past surgical history on file.  Family History  Problem Relation Age of Onset  . Arthritis Mother   . Heart disease Mother   . Hypertension Mother   . Alcohol abuse Father   . ALS Father   . Rheum arthritis Maternal Grandmother     Allergies  Allergen Reactions  . Sulfa Antibiotics Other (See Comments)    Current Outpatient  Prescriptions on File Prior to Visit  Medication Sig Dispense Refill  . amitriptyline (ELAVIL) 50 MG tablet Take 1 tablet (50 mg total) by mouth at bedtime. 30 tablet 1  . naproxen (NAPROSYN) 500 MG tablet Take 1 tablet (500 mg total) by mouth 2 (two) times daily with a meal. 60 tablet 0   No current facility-administered medications on file prior to visit.     BP (!) 144/86   Pulse 78   Temp 98.2 F (36.8 C) (Oral)   Ht 5\' 9"  (1.753 m)   Wt 199 lb 6.4 oz (90.4 kg)   SpO2 98%   BMI 29.45 kg/m    Objective:   Physical Exam  Constitutional: He appears well-nourished.  Eyes: EOM are normal. Pupils are equal, round, and reactive to light.  Cardiovascular: Normal rate and regular rhythm.    Pulmonary/Chest: Effort normal and breath sounds normal.  Neurological: No cranial nerve deficit.  Skin: Skin is warm and dry.  Psychiatric: He has a normal mood and affect.          Assessment & Plan:

## 2016-07-13 NOTE — Patient Instructions (Signed)
We increased the dose of your Imitrex from 50 mg to 100 mg. Try to use this sparingly and do not exceed 2 tablets in 24 hours.  Continue Amitriptyline 50 mg at bedtime.  You may take Melatonin tablets as needed for difficulty sleeping. Take 5 to 10 mg 1 hour prior to bedtime.  Please notify me if no improvement in your migraines/headaches after the injections with your neurologist.  It was a pleasure to see you today!

## 2016-07-15 ENCOUNTER — Ambulatory Visit
Admission: RE | Admit: 2016-07-15 | Discharge: 2016-07-15 | Disposition: A | Discharge status: Home / Self Care | Payer: No Typology Code available for payment source | Source: Ambulatory Visit | Attending: Neurosurgery | Admitting: Neurosurgery

## 2016-07-15 DIAGNOSIS — M5412 Radiculopathy, cervical region: Secondary | ICD-10-CM

## 2016-07-15 DIAGNOSIS — M5136 Other intervertebral disc degeneration, lumbar region: Secondary | ICD-10-CM | POA: Diagnosis not present

## 2016-07-15 DIAGNOSIS — Q763 Congenital scoliosis due to congenital bony malformation: Secondary | ICD-10-CM | POA: Insufficient documentation

## 2016-07-15 DIAGNOSIS — Q7649 Other congenital malformations of spine, not associated with scoliosis: Secondary | ICD-10-CM

## 2016-07-22 ENCOUNTER — Other Ambulatory Visit: Payer: Self-pay | Admitting: Neurosurgery

## 2016-07-22 DIAGNOSIS — Q7649 Other congenital malformations of spine, not associated with scoliosis: Secondary | ICD-10-CM

## 2016-08-02 ENCOUNTER — Ambulatory Visit
Admission: RE | Admit: 2016-08-02 | Discharge: 2016-08-02 | Disposition: A | Discharge status: Home / Self Care | Payer: PRIVATE HEALTH INSURANCE | Source: Ambulatory Visit | Attending: Neurosurgery | Admitting: Neurosurgery

## 2016-08-02 DIAGNOSIS — Q7649 Other congenital malformations of spine, not associated with scoliosis: Secondary | ICD-10-CM

## 2016-08-02 MED ORDER — IOPAMIDOL (ISOVUE-M 200) INJECTION 41%
1.0000 mL | Freq: Once | INTRAMUSCULAR | Status: AC
  Administered 2016-08-02: 1 mL via INTRA_ARTICULAR

## 2016-08-02 NOTE — Discharge Instructions (Signed)

## 2016-10-11 ENCOUNTER — Emergency Department: Payer: PRIVATE HEALTH INSURANCE

## 2016-10-11 ENCOUNTER — Encounter: Payer: Self-pay | Admitting: Emergency Medicine

## 2016-10-11 ENCOUNTER — Emergency Department
Admission: EM | Admit: 2016-10-11 | Discharge: 2016-10-11 | Disposition: A | Discharge status: Home / Self Care | Payer: PRIVATE HEALTH INSURANCE | Attending: Emergency Medicine | Admitting: Emergency Medicine

## 2016-10-11 ENCOUNTER — Telehealth: Payer: Self-pay | Admitting: Primary Care

## 2016-10-11 DIAGNOSIS — R200 Anesthesia of skin: Secondary | ICD-10-CM | POA: Diagnosis present

## 2016-10-11 DIAGNOSIS — Z79899 Other long term (current) drug therapy: Secondary | ICD-10-CM | POA: Insufficient documentation

## 2016-10-11 DIAGNOSIS — J45909 Unspecified asthma, uncomplicated: Secondary | ICD-10-CM | POA: Diagnosis not present

## 2016-10-11 DIAGNOSIS — J320 Chronic maxillary sinusitis: Secondary | ICD-10-CM | POA: Insufficient documentation

## 2016-10-11 DIAGNOSIS — F17210 Nicotine dependence, cigarettes, uncomplicated: Secondary | ICD-10-CM | POA: Diagnosis not present

## 2016-10-11 LAB — BASIC METABOLIC PANEL
ANION GAP: 7 (ref 5–15)
BUN: 11 mg/dL (ref 6–20)
CALCIUM: 9.2 mg/dL (ref 8.9–10.3)
CHLORIDE: 105 mmol/L (ref 101–111)
CO2: 26 mmol/L (ref 22–32)
CREATININE: 0.96 mg/dL (ref 0.61–1.24)
GFR calc non Af Amer: >60 mL/min (ref >60)
Glucose, Bld: 119 mg/dL — ABNORMAL HIGH (ref 65–99)
Potassium: 3.4 mmol/L — ABNORMAL LOW (ref 3.5–5.1)
SODIUM: 138 mmol/L (ref 135–145)

## 2016-10-11 LAB — CBC
HCT: 42.3 % (ref 40.0–52.0)
HEMOGLOBIN: 14.8 g/dL (ref 13.0–18.0)
MCH: 31.7 pg (ref 26.0–34.0)
MCHC: 35 g/dL (ref 32.0–36.0)
MCV: 90.5 fL (ref 80.0–100.0)
Platelets: 219 10*3/uL (ref 150–440)
RBC: 4.67 MIL/uL (ref 4.40–5.90)
RDW: 13 % (ref 11.5–14.5)
WBC: 9.2 10*3/uL (ref 3.8–10.6)

## 2016-10-11 MED ORDER — OXYMETAZOLINE HCL 0.05 % NA SOLN
1.0000 | Freq: Once | NASAL | Status: AC
  Administered 2016-10-11: 1 via NASAL
  Filled 2016-10-11: qty 15

## 2016-10-11 MED ORDER — IOPAMIDOL (ISOVUE-300) INJECTION 61%
75.0000 mL | Freq: Once | INTRAVENOUS | Status: AC | PRN
  Administered 2016-10-11: 75 mL via INTRAVENOUS

## 2016-10-11 MED ORDER — AMOXICILLIN-POT CLAVULANATE 875-125 MG PO TABS
1.0000 | ORAL_TABLET | Freq: Once | ORAL | Status: AC
  Administered 2016-10-11: 1 via ORAL
  Filled 2016-10-11: qty 1

## 2016-10-11 MED ORDER — AMOXICILLIN-POT CLAVULANATE 875-125 MG PO TABS: 1.0000 | ORAL_TABLET | Freq: Two times a day (BID) | ORAL | 0 refills | Status: AC

## 2016-10-11 MED ORDER — SODIUM CHLORIDE 0.9 % IV BOLUS (SEPSIS)
1000.0000 mL | Freq: Once | INTRAVENOUS | Status: AC
  Administered 2016-10-11: 1000 mL via INTRAVENOUS

## 2016-10-11 MED ORDER — HYDROCODONE-ACETAMINOPHEN 5-325 MG PO TABS: 1.0000 | ORAL_TABLET | ORAL | 0 refills | Status: DC | PRN

## 2016-10-11 MED ORDER — TETRACAINE HCL 0.5 % OP SOLN
2.0000 [drp] | Freq: Once | OPHTHALMIC | Status: AC
  Administered 2016-10-11: 2 [drp] via OPHTHALMIC
  Filled 2016-10-11: qty 2

## 2016-10-11 MED ORDER — KETOROLAC TROMETHAMINE 30 MG/ML IJ SOLN
30.0000 mg | Freq: Once | INTRAMUSCULAR | Status: AC
  Administered 2016-10-11: 30 mg via INTRAVENOUS
  Filled 2016-10-11: qty 1

## 2016-10-11 NOTE — Telephone Encounter (Signed)
pts mother [facility] and pt is very lethargic and can hardly hold his head up and they have been sitting in waiting room for 20 mins. As[facility] that [facility] for how long pt is going to wait before being taken back. I spoke with Noreene LarssonJill the chg nurse and she will go to waiting room to talk with pt. Pts mom appreciative and voiced understanding.

## 2016-10-11 NOTE — ED Notes (Signed)
Pt reports that he is having pain in left eye and "sinus cavity" - he feels like the left side of his throat is closed off - pt states he has only had symptoms for the past 2-3 hours and that PCP told him to come to the ED - afebrile - denies N/V, diarrhea - reports he did have some dizziness at work today - pt reports he has a headache but that he has a headache "always" - pt is alert and oriented x4 and able to carry on full conversation with this nurse (mother reports that he is lethargic and unable to carry on a conversation) - pt reports numb sensation in left arm (only present for 20 minutes)

## 2016-10-11 NOTE — Telephone Encounter (Signed)
[facility] - Client TELEPHONE ADVICE RECORD [facility] Patient Name: Zachary NonesBRIAN Gries DOB: 03/21/1976 Initial Comment Caller states, her father needs to make an appointment - left eye issues, he is having issues swallowing and he chocks on water. Can get liquid all the way down. he has not eaten much today. Verified Nurse Assessment Nurse: Leveda AnnaHensel, RN, Aeriel Date/Time Lamount Cohen(Eastern Time): 10/11/2016 12:21:21 PM Confirm and document reason for call. If symptomatic, describe symptoms. ---Caller states, her father needs to make an appointment - left eye issues, he is having issues swallowing and he chocks on water. Can get liquid all the way down. he has not eaten much today. He woke up at 4am with stabbing pain in his eye. He also has pain in the left side of his throat. Does the patient have any new or worsening symptoms? ---Yes Will a triage be completed? ---Yes Related visit to physician within the last 2 weeks? ---No Does the PT have any chronic conditions? (i.e. diabetes, asthma, etc.) ---No Is this a behavioral health or substance abuse call? ---No Guidelines Guideline Title Affirmed Question Affirmed Notes Swallowing Difficulty Sounds like a life-threatening emergency to the triager Final Disposition User Call EMS 911 Now Hensel, RN, Aeriel Comments Nurse choice related to nurse wants to rule out stroke. He is having left eye issues and left eye stabbing pain feels like the left throat is closing and having a hard time swallowing all only on the left side. Caller refused 911 outcome. Caller states, he will have someone drive him to the Ed. Referrals [facility] - ED Disagree/Comply: Disagree Disagree/Comply Reason: Disagree with instructions

## 2016-10-11 NOTE — ED Provider Notes (Signed)
[facility] Emergency Department Provider Note  Time seen: 1:46 PM  I have reviewed the triage vital signs and the nursing notes.   HISTORY  Chief Complaint No chief complaint on file.    HPI Zachary Khan is a 40 y.o. male with a history of chronic pain, migraines, cervical spine issues, who presents to the emergency department with complaints of left eye pain, left sinus pain, sore throat, and numbness/tingling sensation in the left neck. Patient states the symptoms started this morning. States he is having pain many swallows. Denies any difficulty breathing, denies cough, congestion or fever. Does state significant pain over his left maxillary sinus with pain radiating into his left thigh worse with movement or looking at light. He has a history of migraines but states this pain feels different. Patient does have numbness/tingling sensation to his left neck. States chronic neck issues. Contrary to triage note denies any pain or numbness in the left arm.  Past Medical History:  Diagnosis Date  . Asthma   . Cervical spine degeneration   . Chicken pox   . Chronic back pain   . Frequent headaches   . Migraine     Patient Active Problem List   Diagnosis Date Noted  . Migraines 06/01/2016  . Chronic back pain 06/01/2016  . Chronic neck pain 06/01/2016    History reviewed. No pertinent surgical history.  Prior to Admission medications   Medication Sig Start Date End Date Taking? Authorizing Provider  amitriptyline (ELAVIL) 50 MG tablet Take 1 tablet (50 mg total) by mouth at bedtime. 06/22/16   Doreene NestKatherine K Clark, NP  naproxen (NAPROSYN) 500 MG tablet Take 1 tablet (500 mg total) by mouth 2 (two) times daily with a meal. 05/23/16   Evangeline Dakinharles M Beers, PA-C  SUMAtriptan (IMITREX) 100 MG tablet Take 1 tablet at migraine onset. May repeat in 2 hours if no resolve. Do not exceed 2 tablets in 24 hours. 07/13/16   Doreene NestKatherine K Clark, NP    Allergies  Allergen Reactions   . Sulfa Antibiotics Other (See Comments)    Family History  Problem Relation Age of Onset  . Arthritis Mother   . Heart disease Mother   . Hypertension Mother   . Alcohol abuse Father   . ALS Father   . Rheum arthritis Maternal Grandmother     Social History Social History  Substance Use Topics  . Smoking status: Current Every Day Smoker    Packs/day: 1.00    Types: Cigarettes  . Smokeless tobacco: Never Used  . Alcohol use Yes     Comment: soical    Review of Systems Constitutional: Negative for fever. Eyes: States mild blurred vision, but also states he has been rubbing the eye.  ENT: States moderate left maxillary sinus pain Cardiovascular: Negative for chest pain. Respiratory: Negative for shortness of breath. Gastrointestinal: Negative for abdominal pain Neurological: Negative for headache 10-point ROS otherwise negative.  ____________________________________________   PHYSICAL EXAM:  VITAL SIGNS: ED Triage Vitals [10/11/16 1319]  Enc Vitals Group     BP (!) 151/98     Pulse Rate 70     Resp 18     Temp 98.3 F (36.8 C)     Temp Source Oral     SpO2 98 %     Weight 195 lb (88.5 kg)     Height 5\' 8"  (1.727 m)     Head Circumference      Peak Flow  Pain Score 7     Pain Loc      Pain Edu?      Excl. in GC?     Constitutional: Alert and oriented. Well appearing and in no distress. Eyes: Mild conjunctival injection of the left eye. Pupil 2-3 millimeter, PERRL bilaterally. Mild photophobia. ENT   Head: Normocephalic and atraumatic.   Mouth/Throat: Mucous membranes are moist. Mild pharyngeal erythema without exudate, tonsillar hypertrophy or uvula deviation. Moderate left maxillary sinus tenderness to palpation. Cardiovascular: Normal rate, regular rhythm. No murmur Respiratory: Normal respiratory effort without tachypnea nor retractions. Breath sounds are clear  Gastrointestinal: Soft and nontender. No distention.  Musculoskeletal:  Nontender with normal range of motion in all extremities. Neurologic:  Normal speech and language. No gross focal neurologic deficits Skin:  Skin is warm, dry and intact.  Psychiatric: Mood and affect are normal.   ____________________________________________     RADIOLOGY  CT show maxillary sinus congestion possibly chronic, otherwise negative. Left upper molar dental decay. No abscess.  ____________________________________________   INITIAL IMPRESSION / ASSESSMENT AND PLAN / ED COURSE  Pertinent labs & imaging results that were available during my care of the patient were reviewed by me and considered in my medical decision making (see chart for details).  Patient presents to the emergency department with left sinus pain, left eye pain, states mild blurred vision but admits he has been rubbing the eye. Overall the patient appears well, no acute distress. We will check labs, obtain CT imaging of the head and sinuses to further evaluate. We will treat the patient's discomfort while awaiting results.  Patient CT scan shows mild macular sinus fullness in the left maxillary sinus. Left upper molar decay but no abscess. We will prescribe Augmentin, short course of pain medication and have the patient follow-up with his primary care doctor.  Tonometry performed by myself showing pressures in the left eye 17, 18.  ____________________________________________   FINAL CLINICAL IMPRESSION(S) / ED DIAGNOSES  headache eye pain Maxillary sinususitis   Minna AntisKevin Henryk Ursin, MD 10/11/16 1558

## 2016-10-11 NOTE — ED Triage Notes (Addendum)
Having pain to left eye and side of face    Swelling to throat Feels weak  And dizzy

## 2016-10-11 NOTE — Telephone Encounter (Signed)
Noted  

## 2016-12-30 ENCOUNTER — Ambulatory Visit (INDEPENDENT_AMBULATORY_CARE_PROVIDER_SITE_OTHER): Payer: Managed Care, Other (non HMO) | Admitting: Primary Care

## 2016-12-30 ENCOUNTER — Encounter: Payer: Self-pay | Admitting: Primary Care

## 2016-12-30 VITALS — BP 150/94 | HR 62 | Temp 97.8°F | Ht 69.0 in | Wt 187.4 lb

## 2016-12-30 DIAGNOSIS — R6883 Chills (without fever): Secondary | ICD-10-CM

## 2016-12-30 DIAGNOSIS — B9789 Other viral agents as the cause of diseases classified elsewhere: Secondary | ICD-10-CM

## 2016-12-30 DIAGNOSIS — J069 Acute upper respiratory infection, unspecified: Secondary | ICD-10-CM | POA: Diagnosis not present

## 2016-12-30 LAB — POC INFLUENZA A&B (BINAX/QUICKVUE)
INFLUENZA B, POC: NEGATIVE
Influenza A, POC: NEGATIVE

## 2016-12-30 NOTE — Progress Notes (Signed)
Pre visit review using our clinic review tool, if applicable. No additional management support is needed unless otherwise documented below in the visit note. 

## 2016-12-30 NOTE — Patient Instructions (Signed)
Your symptoms are representative of a viral illness which will resolve on its own over time. Our goal is to treat your symptoms in order to aid your body in the healing process and to make you more comfortable.   Start Tylenol Extra Strength tablets as needed for body aches, fevers, chills.  If you develop a cough then try Delsym or Robitussin.  Ensure you are staying hydrated with water and rest.  It was a pleasure to see you today!

## 2016-12-30 NOTE — Addendum Note (Signed)
Addended by: Tawnya CrookSAMBATH, Nyellie Yetter on: 12/30/2016 11:43 AM   Modules accepted: Orders

## 2016-12-30 NOTE — Progress Notes (Signed)
Subjective:    Patient ID: Billee CashingBrian E Piechocki, male    DOB: 12/17/1975, 41 y.o.   MRN: [id number]  HPI  [name] is a 41 year old male who presents today with a chief complaint of chills. He also reports body aches, mild cough. His symptoms began 2 days ago. He was working outside in the rain/mist Tuesday night. He's taken AirBorne and Nyquil without much improvement. He denies sick contacts. He did not have his flu shot.   Review of Systems  Constitutional: Positive for chills and fatigue. Negative for fever.  HENT: Positive for congestion. Negative for ear pain, postnasal drip, sinus pressure and sore throat.   Respiratory: Positive for cough. Negative for shortness of breath and wheezing.   Cardiovascular: Negative for chest pain.       Past Medical History:  Diagnosis Date  . Asthma   . Cervical spine degeneration   . Chicken pox   . Chronic back pain   . Frequent headaches   . Migraine      Social History   Social History  . Marital status: Divorced    Spouse name: N/A  . Number of children: N/A  . Years of education: N/A   Occupational History  . Not on file.   Social History Main Topics  . Smoking status: Current Every Day Smoker    Packs/day: 1.00    Types: Cigarettes  . Smokeless tobacco: Never Used  . Alcohol use Yes     Comment: soical  . Drug use: Unknown  . Sexual activity: Not on file   Other Topics Concern  . Not on file   Social History Narrative   Single.   4 children.   Works as a Retail bankerservice technician.   Highest level of education: GED.       No past surgical history on file.  Family History  Problem Relation Age of Onset  . Arthritis Mother   . Heart disease Mother   . Hypertension Mother   . Alcohol abuse Father   . ALS Father   . Rheum arthritis Maternal Grandmother     Allergies  Allergen Reactions  . Sulfa Antibiotics Other (See Comments)    Current Outpatient Prescriptions on File Prior to Visit  Medication Sig Dispense  Refill  . amitriptyline (ELAVIL) 50 MG tablet Take 1 tablet (50 mg total) by mouth at bedtime. 30 tablet 1  . SUMAtriptan (IMITREX) 100 MG tablet Take 1 tablet at migraine onset. May repeat in 2 hours if no resolve. Do not exceed 2 tablets in 24 hours. 10 tablet 1   No current facility-administered medications on file prior to visit.     BP (!) 150/94   Pulse 62   Temp 97.8 F (36.6 C) (Oral)   Ht 5\' 9"  (1.753 m)   Wt 187 lb 6.4 oz (85 kg)   SpO2 97%   BMI 27.67 kg/m    Objective:   Physical Exam  Constitutional: He appears well-nourished. He appears ill.  HENT:  Right Ear: Tympanic membrane and ear canal normal.  Left Ear: Tympanic membrane and ear canal normal.  Nose: No mucosal edema. Right sinus exhibits no maxillary sinus tenderness and no frontal sinus tenderness. Left sinus exhibits no maxillary sinus tenderness and no frontal sinus tenderness.  Mouth/Throat: Oropharynx is clear and moist.  Eyes: Conjunctivae are normal.  Neck: Neck supple.  Cardiovascular: Normal rate and regular rhythm.   Pulmonary/Chest: Effort normal and breath sounds normal. He has no  wheezes. He has no rales.  Lymphadenopathy:    He has cervical adenopathy.  Skin: Skin is warm and dry.          Assessment & Plan:  Viral URI:  Mild cough, chills, body aches x 2 days. Was outside in the rain Tuesday night prior. Exam today with clear lungs, stable vitals, does appear ill, not sickly. Rapid Flu: Negative. Suspect viral involvement. Start tylenol/ibuprofen PRN.  Fluids, rest, follow up PRN.  Morrie Sheldon, NP

## 2017-04-18 ENCOUNTER — Emergency Department
Admission: EM | Admit: 2017-04-18 | Discharge: 2017-04-18 | Disposition: A | Discharge status: Home / Self Care | Payer: Managed Care, Other (non HMO) | Attending: Emergency Medicine | Admitting: Emergency Medicine

## 2017-04-18 ENCOUNTER — Emergency Department: Payer: Managed Care, Other (non HMO)

## 2017-04-18 ENCOUNTER — Encounter: Payer: Self-pay | Admitting: Emergency Medicine

## 2017-04-18 DIAGNOSIS — F17210 Nicotine dependence, cigarettes, uncomplicated: Secondary | ICD-10-CM | POA: Insufficient documentation

## 2017-04-18 DIAGNOSIS — R0789 Other chest pain: Secondary | ICD-10-CM | POA: Insufficient documentation

## 2017-04-18 DIAGNOSIS — Z79899 Other long term (current) drug therapy: Secondary | ICD-10-CM | POA: Insufficient documentation

## 2017-04-18 DIAGNOSIS — R079 Chest pain, unspecified: Secondary | ICD-10-CM

## 2017-04-18 LAB — BASIC METABOLIC PANEL
Anion gap: 8 (ref 5–15)
BUN: 12 mg/dL (ref 6–20)
CHLORIDE: 106 mmol/L (ref 101–111)
CO2: 27 mmol/L (ref 22–32)
CREATININE: 1.15 mg/dL (ref 0.61–1.24)
Calcium: 9.4 mg/dL (ref 8.9–10.3)
GFR calc Af Amer: >60 mL/min (ref >60)
GFR calc non Af Amer: >60 mL/min (ref >60)
GLUCOSE: 96 mg/dL (ref 65–99)
POTASSIUM: 3.4 mmol/L — AB (ref 3.5–5.1)
SODIUM: 141 mmol/L (ref 135–145)

## 2017-04-18 LAB — CBC
HEMATOCRIT: 45.5 % (ref 40.0–52.0)
Hemoglobin: 16.1 g/dL (ref 13.0–18.0)
MCH: 31.8 pg (ref 26.0–34.0)
MCHC: 35.5 g/dL (ref 32.0–36.0)
MCV: 89.4 fL (ref 80.0–100.0)
PLATELETS: 284 10*3/uL (ref 150–440)
RBC: 5.08 MIL/uL (ref 4.40–5.90)
RDW: 12.5 % (ref 11.5–14.5)
WBC: 10.2 10*3/uL (ref 3.8–10.6)

## 2017-04-18 LAB — TROPONIN I: Troponin I: <0.03 ng/mL (ref <0.03)

## 2017-04-18 NOTE — ED Notes (Signed)
Pt states that he has chest pain. Started around lunch time on yesterday. Took some Prilosec and baby aspirin but neither worked. Family at bedside.

## 2017-04-18 NOTE — ED Provider Notes (Signed)
[facility] Emergency Department Provider Note  ____________________________________________   First MD Initiated Contact with Patient 04/18/17 0408     (approximate)  I have reviewed the triage vital signs and the nursing notes.   HISTORY  Chief Complaint Chest Pain   HPI Zachary Khan is a 41 y.o. male who is presenting to the emergency department today with chest pain to left side of his chest which started at noon yesterday. He says the pain is mild to moderate and feels like a pressure type pain. It is nonradiating. There is no associated shortness of breath, nausea or vomiting. No diaphoresis. Patient says it worsens when he lays down. Says that he has similar episode 6 or 7 years ago and was seen by cardiology and had a stress test and an ultrasound. He says the stress test was negative but the ultrasound revealed a leaking valve. He says that multiple people in his family have valvular issues. Does not know of anyone in their 9140s who had a heart attack and this is primary concern for being evaluated today.   Past Medical History:  Diagnosis Date  . Asthma   . Cervical spine degeneration   . Chicken pox   . Chronic back pain   . Frequent headaches   . Migraine     Patient Active Problem List   Diagnosis Date Noted  . Migraines 06/01/2016  . Chronic back pain 06/01/2016  . Chronic neck pain 06/01/2016    History reviewed. No pertinent surgical history.  Prior to Admission medications   Medication Sig Start Date End Date Taking? Authorizing Provider  amitriptyline (ELAVIL) 50 MG tablet Take 1 tablet (50 mg total) by mouth at bedtime. 06/22/16   Doreene Nestlark, Katherine K, NP  SUMAtriptan (IMITREX) 100 MG tablet Take 1 tablet at migraine onset. May repeat in 2 hours if no resolve. Do not exceed 2 tablets in 24 hours. 07/13/16   Doreene Nestlark, Katherine K, NP    Allergies Sulfa antibiotics and Vicodin [hydrocodone-acetaminophen]  Family History  Problem  Relation Age of Onset  . Arthritis Mother   . Heart disease Mother   . Hypertension Mother   . Alcohol abuse Father   . ALS Father   . Rheum arthritis Maternal Grandmother     Social History Social History  Substance Use Topics  . Smoking status: Current Every Day Smoker    Packs/day: 1.00    Types: Cigarettes  . Smokeless tobacco: Never Used  . Alcohol use Yes     Comment: soical    Review of Systems  Constitutional: No fever/chills Eyes: No visual changes. ENT: No sore throat. Cardiovascular: as above Respiratory: Denies shortness of breath. Gastrointestinal: No abdominal pain.  No nausea, no vomiting.  No diarrhea.  No constipation. Genitourinary: Negative for dysuria. Musculoskeletal: Negative for back pain. Skin: Negative for rash. Neurological: Negative for headaches, focal weakness or numbness.   ____________________________________________   PHYSICAL EXAM:  VITAL SIGNS: ED Triage Vitals  Enc Vitals Group     BP 04/18/17 0115 (!) 164/100     Pulse Rate 04/18/17 0115 67     Resp 04/18/17 0115 18     Temp 04/18/17 0115 97.7 F (36.5 C)     Temp src --      SpO2 04/18/17 0115 99 %     Weight 04/18/17 0113 198 lb (89.8 kg)     Height 04/18/17 0113 5\' 9"  (1.753 m)     Head Circumference --  Peak Flow --      Pain Score 04/18/17 0113 6     Pain Loc --      Pain Edu? --      Excl. in GC? --     Constitutional: Alert and oriented. Well appearing and in no acute distress. Eyes: Conjunctivae are normal.  Head: Atraumatic. Nose: No congestion/rhinnorhea. Mouth/Throat: Mucous membranes are moist.  Neck: No stridor.   Cardiovascular: Normal rate, regular rhythm. Grossly normal heart sounds.  Equal and bilateral radial pulses.  Chest pain is not reproducible with palpation. Respiratory: Normal respiratory effort.  No retractions. Lungs CTAB. Gastrointestinal: Soft and nontender. No distention.  Musculoskeletal: No lower extremity tenderness nor edema.   No joint effusions. Neurologic:  Normal speech and language. No gross focal neurologic deficits are appreciated. Skin:  Skin is warm, dry and intact. No rash noted. Psychiatric: Mood and affect are normal. Speech and behavior are normal.  ____________________________________________   LABS (all labs ordered are listed, but only abnormal results are displayed)  Labs Reviewed  BASIC METABOLIC PANEL - Abnormal; Notable for the following:       Result Value   Potassium 3.4 (*)    All other components within normal limits  CBC  TROPONIN I   ____________________________________________  EKG  ED ECG REPORT I, Kailey Esquilin,  Teena Irani, the attending physician, personally viewed and interpreted this ECG.   Date: 04/18/2017  EKG Time: 0115  Rate: 67  Rhythm: normal sinus rhythm  Axis: Normal  Intervals:LVH  ST&T Change: Single ST elevation in V2 which is unchanged from EKG of 06/05/2011. Otherwise there are no ST elevations or depressions. No abnormal T-wave inversion.  ____________________________________________  RADIOLOGY  No acute finding on the chest x-ray. ____________________________________________   PROCEDURES  Procedure(s) performed:   Procedures  Critical Care performed:   ____________________________________________   INITIAL IMPRESSION / ASSESSMENT AND PLAN / ED COURSE  Pertinent labs & imaging results that were available during my care of the patient were reviewed by me and considered in my medical decision making (see chart for details).  Patient says his primary concern was whether he was having a heart attack or not. Patient with a heart score of 2. Similar episode in the past which did not appear to be ischemic. Patient will be given follow-up with cardiology. Will be discharged home. Very reassuring workup. Unclear cause but does not appear to be requiring further workup at this time such as would be the case with ACS, PE or aortic catastrophe. PERC  negative.       ____________________________________________   FINAL CLINICAL IMPRESSION(S) / ED DIAGNOSES  Chest pain    NEW MEDICATIONS STARTED DURING THIS VISIT:  New Prescriptions   No medications on file     Note:  This document was prepared using Dragon voice recognition software and may include unintentional dictation errors.     Myrna Blazer, MD 04/18/17 [phone]

## 2017-04-18 NOTE — ED Triage Notes (Signed)
Pt to triage via w/c with no distress noted; Pt reports left sided CP since noon yesterday; took nexium and 81mg  ASA without relief; st pain nonradiating, no accomp symptoms; stress test and echo in past for same and told "had a leaky valve"

## 2017-11-10 ENCOUNTER — Encounter: Payer: Self-pay | Admitting: Emergency Medicine

## 2017-11-10 ENCOUNTER — Emergency Department: Payer: 59

## 2017-11-10 ENCOUNTER — Other Ambulatory Visit: Payer: Self-pay

## 2017-11-10 ENCOUNTER — Emergency Department
Admission: EM | Admit: 2017-11-10 | Discharge: 2017-11-10 | Disposition: A | Discharge status: Home / Self Care | Payer: 59 | Attending: Emergency Medicine | Admitting: Emergency Medicine

## 2017-11-10 DIAGNOSIS — R0602 Shortness of breath: Secondary | ICD-10-CM | POA: Diagnosis not present

## 2017-11-10 DIAGNOSIS — R0789 Other chest pain: Secondary | ICD-10-CM | POA: Diagnosis not present

## 2017-11-10 DIAGNOSIS — J45909 Unspecified asthma, uncomplicated: Secondary | ICD-10-CM | POA: Insufficient documentation

## 2017-11-10 DIAGNOSIS — R091 Pleurisy: Secondary | ICD-10-CM | POA: Diagnosis not present

## 2017-11-10 DIAGNOSIS — Z79899 Other long term (current) drug therapy: Secondary | ICD-10-CM | POA: Diagnosis not present

## 2017-11-10 DIAGNOSIS — F17210 Nicotine dependence, cigarettes, uncomplicated: Secondary | ICD-10-CM | POA: Diagnosis not present

## 2017-11-10 DIAGNOSIS — R079 Chest pain, unspecified: Secondary | ICD-10-CM | POA: Diagnosis not present

## 2017-11-10 LAB — BASIC METABOLIC PANEL
ANION GAP: 10 (ref 5–15)
BUN: 10 mg/dL (ref 6–20)
CO2: 23 mmol/L (ref 22–32)
Calcium: 9.2 mg/dL (ref 8.9–10.3)
Chloride: 106 mmol/L (ref 101–111)
Creatinine, Ser: 0.88 mg/dL (ref 0.61–1.24)
GFR calc Af Amer: >60 mL/min (ref >60)
GFR calc non Af Amer: >60 mL/min (ref >60)
GLUCOSE: 83 mg/dL (ref 65–99)
POTASSIUM: 3.8 mmol/L (ref 3.5–5.1)
Sodium: 139 mmol/L (ref 135–145)

## 2017-11-10 LAB — CBC
HEMATOCRIT: 47.4 % (ref 40.0–52.0)
HEMOGLOBIN: 16.2 g/dL (ref 13.0–18.0)
MCH: 31.2 pg (ref 26.0–34.0)
MCHC: 34.1 g/dL (ref 32.0–36.0)
MCV: 91.4 fL (ref 80.0–100.0)
Platelets: 239 10*3/uL (ref 150–440)
RBC: 5.18 MIL/uL (ref 4.40–5.90)
RDW: 13 % (ref 11.5–14.5)
WBC: 8.7 10*3/uL (ref 3.8–10.6)

## 2017-11-10 LAB — TROPONIN I: Troponin I: <0.03 ng/mL (ref <0.03)

## 2017-11-10 LAB — FIBRIN DERIVATIVES D-DIMER (ARMC ONLY): Fibrin derivatives D-dimer (ARMC): 252 ng/mL (FEU) (ref 0.00–499.00)

## 2017-11-10 MED ORDER — FAMOTIDINE 20 MG PO TABS: 20.0000 mg | ORAL_TABLET | Freq: Two times a day (BID) | ORAL | 0 refills | Status: DC

## 2017-11-10 MED ORDER — PREDNISONE 20 MG PO TABS: 40.0000 mg | ORAL_TABLET | Freq: Every day | ORAL | 0 refills | Status: DC

## 2017-11-10 MED ORDER — KETOROLAC TROMETHAMINE 30 MG/ML IJ SOLN
INTRAMUSCULAR | Status: AC
  Administered 2017-11-10: 15 mg via INTRAVENOUS
  Filled 2017-11-10: qty 1

## 2017-11-10 MED ORDER — KETOROLAC TROMETHAMINE 60 MG/2ML IM SOLN: 15.0000 mg | Freq: Once | INTRAMUSCULAR | Status: DC

## 2017-11-10 MED ORDER — KETOROLAC TROMETHAMINE 30 MG/ML IJ SOLN
15.0000 mg | Freq: Once | INTRAMUSCULAR | Status: AC
  Administered 2017-11-10: 15 mg via INTRAVENOUS

## 2017-11-10 MED ORDER — KETOROLAC TROMETHAMINE 10 MG PO TABS: 10.0000 mg | ORAL_TABLET | Freq: Four times a day (QID) | ORAL | 0 refills | Status: DC | PRN

## 2017-11-10 NOTE — ED Provider Notes (Signed)
[facility] Emergency Department Provider Note  ____________________________________________  Time seen: Approximately 12:49 PM  I have reviewed the triage vital signs and the nursing notes.   HISTORY  Chief Complaint Chest Pain and Shortness of Breath    HPI Zachary Khan is a 42 y.o. male who complains of bilateral anterior chest pain that started suddenly yesterday at about 2 PM as he was finishing up working on his car. It sharp and hurts with movement and change in position and deep breathing. When he is still and upright, he has no pain. No vomiting or diaphoresis. Not exertional. No cough or shortness of breath.  No recent travel, hospitalization or surgeries. No history of DVT or PE.     Past Medical History:  Diagnosis Date  . Asthma   . Cervical spine degeneration   . Chicken pox   . Chronic back pain   . Frequent headaches   . Migraine      Patient Active Problem List   Diagnosis Date Noted  . Migraines 06/01/2016  . Chronic back pain 06/01/2016  . Chronic neck pain 06/01/2016     History reviewed. No pertinent surgical history.   Prior to Admission medications   Medication Sig Start Date End Date Taking? Authorizing Provider  amitriptyline (ELAVIL) 50 MG tablet Take 1 tablet (50 mg total) by mouth at bedtime. 06/22/16   Doreene Nest, NP  famotidine (PEPCID) 20 MG tablet Take 1 tablet (20 mg total) by mouth 2 (two) times daily. 11/10/17   Sharman Cheek, MD  ketorolac (TORADOL) 10 MG tablet Take 1 tablet (10 mg total) by mouth every 6 (six) hours as needed for moderate pain. 11/10/17   Sharman Cheek, MD  predniSONE (DELTASONE) 20 MG tablet Take 2 tablets (40 mg total) by mouth daily. 11/10/17   Sharman Cheek, MD  SUMAtriptan (IMITREX) 100 MG tablet Take 1 tablet at migraine onset. May repeat in 2 hours if no resolve. Do not exceed 2 tablets in 24 hours. 07/13/16   Doreene Nest, NP     Allergies Sulfa  antibiotics and Vicodin [hydrocodone-acetaminophen]   Family History  Problem Relation Age of Onset  . Arthritis Mother   . Heart disease Mother   . Hypertension Mother   . Alcohol abuse Father   . ALS Father   . Rheum arthritis Maternal Grandmother     Social History Social History   Tobacco Use  . Smoking status: Current Every Day Smoker    Packs/day: 1.00    Types: Cigarettes  . Smokeless tobacco: Never Used  Substance Use Topics  . Alcohol use: Yes    Comment: soical  . Drug use: Not on file    Review of Systems  Constitutional:   No fever or chills.   Cardiovascular:  positive as above chest pain without syncope. Respiratory:   No dyspnea or cough. Gastrointestinal:   Negative for abdominal pain, vomiting and diarrhea.  Musculoskeletal:   Negative for focal pain or swelling All other systems reviewed and are negative except as documented above in ROS and HPI.  ____________________________________________   PHYSICAL EXAM:  VITAL SIGNS: ED Triage Vitals [11/10/17 1031]  Enc Vitals Group     BP (!) 162/93     Pulse Rate 85     Resp 18     Temp 98.3 F (36.8 C)     Temp Source Oral     SpO2 96 %     Weight 195 lb (88.5 kg)  Height 5\' 9"  (1.753 m)     Head Circumference      Peak Flow      Pain Score      Pain Loc      Pain Edu?      Excl. in GC?     Vital signs reviewed, nursing assessments reviewed.   Constitutional:   Alert and oriented. Well appearing and in no distress. Eyes:   No scleral icterus.  EOMI. No nystagmus. No conjunctival pallor. PERRL. ENT   Head:   Normocephalic and atraumatic.   Nose:   No congestion/rhinnorhea.    Mouth/Throat:   MMM, no pharyngeal erythema. No peritonsillar mass.    Neck:   No meningismus. Full ROM. Hematological/Lymphatic/Immunilogical:   No cervical lymphadenopathy. Cardiovascular:   RRR. Symmetric bilateral radial and DP pulses.  No murmurs.  Respiratory:   Normal respiratory effort  without tachypnea/retractions. Breath sounds are clear and equal bilaterally. No wheezes/rales/rhonchi. Gastrointestinal:   Soft and nontender. Non distended. There is no CVA tenderness.  No rebound, rigidity, or guarding. Genitourinary:   deferred Musculoskeletal:   Normal range of motion in all extremities. No joint effusions.  No lower extremity tenderness.  No edema.chest wall nontender Neurologic:   Normal speech and language.  Motor grossly intact. No acute focal neurologic deficits are appreciated.  Skin:    Skin is warm, dry and intact. No rash noted.  No petechiae, purpura, or bullae.  ____________________________________________    LABS (pertinent positives/negatives) (all labs ordered are listed, but only abnormal results are displayed) Labs Reviewed  BASIC METABOLIC PANEL  CBC  TROPONIN I  FIBRIN DERIVATIVES D-DIMER ([facility] ONLY)   ____________________________________________   EKG  interpreted by me Normal sinus rhythm rate of 83, normal axis and intervals. Normal QRS ST segments and T waves.  ____________________________________________    RADIOLOGY  Dg Chest 2 View  Result Date: 11/10/2017 CLINICAL DATA:  Shortness of breath and chest pain EXAM: CHEST  2 VIEW COMPARISON:  April 18, 2017 FINDINGS: There is no edema or consolidation. The heart size and pulmonary vascularity are normal. No adenopathy. No pneumothorax. No bone lesions. IMPRESSION: No edema or consolidation. Electronically Signed   By: Bretta Bang III M.D.   On: 11/10/2017 10:49    ____________________________________________   PROCEDURES Procedures  ____________________________________________    CLINICAL IMPRESSION / ASSESSMENT AND PLAN / ED COURSE  Pertinent labs & imaging results that were available during my care of the patient were reviewed by me and considered in my medical decision making (see chart for details).   patient well. No acute distress, presents with atypical  chest pain.Considering the patient's symptoms, medical history, and physical examination today, I have low suspicion for ACS, PE, TAD, pneumothorax, carditis, mediastinitis, pneumonia, CHF, or sepsis.  Due to pleuritic nature, we'll check a d-dimer on the patient is overall very low risk. Checked bedside point of care ultrasound to evaluate  for pericardial effusion. Plan to treat with NSAIDs and steroids for most likely pleurisy.  Clinical Course as of Nov 10 1353  Fri Nov 10, 2017  1259 POCUS completed. No pericardial effusion.   [PS]  1333 Negative. Will DC, tx with nsaids and steroids.  Fibrin derivatives D-dimer [facility]): 252.00 [PS]    Clinical Course User Index [PS] Sharman Cheek, MD     ____________________________________________   FINAL CLINICAL IMPRESSION(S) / ED DIAGNOSES    Final diagnoses:  Atypical chest pain  Pleurisy       Portions of this note were  generated with Scientist, clinical (histocompatibility and immunogenetics)dragon dictation software. Dictation errors may occur despite best attempts at proofreading.    Sharman CheekStafford, Ayleah Hofmeister, MD 11/10/17 1355

## 2017-11-10 NOTE — Discharge Instructions (Signed)
Your blood tests and chest xray today were unremarkable.  Take anti-inflammatory medicine such as toradol or Aleve or ibuprofen, along with prednisone, to control these symptoms.  Do not combine toradol, Aleve, or ibuprofen.   These medications can all cause stomach upset as a side effect. You may need to take an antacid such as famotidine or ranitidine to lower your stomach acid while taking these medicines.

## 2017-11-10 NOTE — ED Triage Notes (Signed)
Pt c/o shortness of breath, states he feels like an elephant is sitting on his chest, states he cannot get comfortable in any position.  Pt also states he coughs occasionally clear phlegm.

## 2017-11-13 ENCOUNTER — Emergency Department
Admission: EM | Admit: 2017-11-13 | Discharge: 2017-11-13 | Disposition: A | Discharge status: Home / Self Care | Payer: 59 | Attending: Emergency Medicine | Admitting: Emergency Medicine

## 2017-11-13 ENCOUNTER — Encounter: Payer: Self-pay | Admitting: Emergency Medicine

## 2017-11-13 ENCOUNTER — Other Ambulatory Visit: Payer: Self-pay

## 2017-11-13 ENCOUNTER — Emergency Department: Payer: 59

## 2017-11-13 DIAGNOSIS — K8020 Calculus of gallbladder without cholecystitis without obstruction: Secondary | ICD-10-CM

## 2017-11-13 DIAGNOSIS — Z9049 Acquired absence of other specified parts of digestive tract: Secondary | ICD-10-CM | POA: Insufficient documentation

## 2017-11-13 DIAGNOSIS — F17210 Nicotine dependence, cigarettes, uncomplicated: Secondary | ICD-10-CM | POA: Diagnosis not present

## 2017-11-13 DIAGNOSIS — R0600 Dyspnea, unspecified: Secondary | ICD-10-CM | POA: Diagnosis not present

## 2017-11-13 DIAGNOSIS — J45909 Unspecified asthma, uncomplicated: Secondary | ICD-10-CM | POA: Insufficient documentation

## 2017-11-13 DIAGNOSIS — R079 Chest pain, unspecified: Secondary | ICD-10-CM

## 2017-11-13 DIAGNOSIS — R0602 Shortness of breath: Secondary | ICD-10-CM | POA: Diagnosis not present

## 2017-11-13 DIAGNOSIS — Z79899 Other long term (current) drug therapy: Secondary | ICD-10-CM | POA: Insufficient documentation

## 2017-11-13 DIAGNOSIS — R05 Cough: Secondary | ICD-10-CM | POA: Insufficient documentation

## 2017-11-13 DIAGNOSIS — R0789 Other chest pain: Secondary | ICD-10-CM | POA: Diagnosis present

## 2017-11-13 LAB — BASIC METABOLIC PANEL
ANION GAP: 11 (ref 5–15)
BUN: 15 mg/dL (ref 6–20)
CALCIUM: 9.5 mg/dL (ref 8.9–10.3)
CO2: 20 mmol/L — ABNORMAL LOW (ref 22–32)
Chloride: 107 mmol/L (ref 101–111)
Creatinine, Ser: 1.08 mg/dL (ref 0.61–1.24)
GFR calc Af Amer: >60 mL/min (ref >60)
Glucose, Bld: 144 mg/dL — ABNORMAL HIGH (ref 65–99)
POTASSIUM: 3.6 mmol/L (ref 3.5–5.1)
SODIUM: 138 mmol/L (ref 135–145)

## 2017-11-13 LAB — CBC
HEMATOCRIT: 49 % (ref 40.0–52.0)
HEMOGLOBIN: 16.8 g/dL (ref 13.0–18.0)
MCH: 31.4 pg (ref 26.0–34.0)
MCHC: 34.3 g/dL (ref 32.0–36.0)
MCV: 91.4 fL (ref 80.0–100.0)
Platelets: 283 10*3/uL (ref 150–440)
RBC: 5.36 MIL/uL (ref 4.40–5.90)
RDW: 13.3 % (ref 11.5–14.5)
WBC: 10.5 10*3/uL (ref 3.8–10.6)

## 2017-11-13 LAB — TROPONIN I: Troponin I: <0.03 ng/mL (ref <0.03)

## 2017-11-13 MED ORDER — OXYCODONE-ACETAMINOPHEN 5-325 MG PO TABS: 1.0000 | ORAL_TABLET | ORAL | 0 refills | Status: DC | PRN

## 2017-11-13 MED ORDER — MORPHINE SULFATE (PF) 2 MG/ML IV SOLN
2.0000 mg | Freq: Once | INTRAVENOUS | Status: AC
  Administered 2017-11-13: 2 mg via INTRAVENOUS
  Filled 2017-11-13: qty 1

## 2017-11-13 MED ORDER — IOPAMIDOL (ISOVUE-370) INJECTION 76%
75.0000 mL | Freq: Once | INTRAVENOUS | Status: AC | PRN
  Administered 2017-11-13: 75 mL via INTRAVENOUS
  Filled 2017-11-13: qty 75

## 2017-11-13 MED ORDER — ONDANSETRON 4 MG PO TBDP: 4.0000 mg | ORAL_TABLET | Freq: Three times a day (TID) | ORAL | 0 refills | Status: DC | PRN

## 2017-11-13 MED ORDER — ONDANSETRON HCL 4 MG/2ML IJ SOLN
4.0000 mg | Freq: Once | INTRAMUSCULAR | Status: AC
  Administered 2017-11-13: 4 mg via INTRAVENOUS
  Filled 2017-11-13: qty 2

## 2017-11-13 NOTE — ED Triage Notes (Signed)
Here for left sided constant chest pain that started a few hours ago. Pt c/o SHOB as well. Unlabored. VSS.  No fever. Has had cough. Color WNL.

## 2017-11-13 NOTE — ED Provider Notes (Signed)
[facility] Emergency Department Provider Note   First MD Initiated Contact with Patient 11/13/17 1835     (approximate)  I have reviewed the triage vital signs and the nursing notes.   HISTORY  Chief Complaint Chest Pain   HPI Zachary Khan is a 42 y.o. male with below list of chronic medical conditions return to the emergency department with left-sided chest pain with radiation into the left arm which is currently 8 out of 10.  Patient was recently seen in the ED on 1/18 with right-sided chest pain and diagnosed with pleurisy.  Patient states that pain has since moved to the left side of the chest with radiation to the left arm.  Patient also admits to a nonproductive cough.  Patient does admit to half a pack per day tobacco use.  Patient denies any fever.  Patient denies any lower extremity pain or swelling.  Patient denies any history of DVT or PE.   Past Medical History:  Diagnosis Date  . Asthma   . Cervical spine degeneration   . Chicken pox   . Chronic back pain   . Frequent headaches   . Migraine     Patient Active Problem List   Diagnosis Date Noted  . Migraines 06/01/2016  . Chronic back pain 06/01/2016  . Chronic neck pain 06/01/2016    History reviewed. No pertinent surgical history.  Prior to Admission medications   Medication Sig Start Date End Date Taking? Authorizing Provider  amitriptyline (ELAVIL) 50 MG tablet Take 1 tablet (50 mg total) by mouth at bedtime. 06/22/16   Doreene Nest, NP  famotidine (PEPCID) 20 MG tablet Take 1 tablet (20 mg total) by mouth 2 (two) times daily. 11/10/17   Sharman Cheek, MD  ketorolac (TORADOL) 10 MG tablet Take 1 tablet (10 mg total) by mouth every 6 (six) hours as needed for moderate pain. 11/10/17   Sharman Cheek, MD  predniSONE (DELTASONE) 20 MG tablet Take 2 tablets (40 mg total) by mouth daily. 11/10/17   Sharman Cheek, MD  SUMAtriptan (IMITREX) 100 MG tablet Take 1 tablet at  migraine onset. May repeat in 2 hours if no resolve. Do not exceed 2 tablets in 24 hours. 07/13/16   Doreene Nest, NP    Allergies Sulfa antibiotics and Vicodin [hydrocodone-acetaminophen]  Family History  Problem Relation Age of Onset  . Arthritis Mother   . Heart disease Mother   . Hypertension Mother   . Alcohol abuse Father   . ALS Father   . Rheum arthritis Maternal Grandmother     Social History Social History   Tobacco Use  . Smoking status: Current Every Day Smoker    Packs/day: 1.00    Types: Cigarettes  . Smokeless tobacco: Never Used  Substance Use Topics  . Alcohol use: Yes    Comment: soical  . Drug use: Not on file    Review of Systems Constitutional: No fever/chills Eyes: No visual changes. ENT: No sore throat. Cardiovascular: Positive for chest pain. Respiratory: Positive for shortness of breath. Gastrointestinal: No abdominal pain.  No nausea, no vomiting.  No diarrhea.  No constipation. Genitourinary: Negative for dysuria. Musculoskeletal: Negative for neck pain.  Negative for back pain. Integumentary: Negative for rash. Neurological: Negative for headaches, focal weakness or numbness.  ____________________________________________   PHYSICAL EXAM:  VITAL SIGNS: ED Triage Vitals  Enc Vitals Group     BP 11/13/17 1536 (!) 150/86     Pulse Rate 11/13/17 1536  76     Resp 11/13/17 1536 (!) 24     Temp 11/13/17 1536 98.4 F (36.9 C)     Temp Source 11/13/17 1536 Oral     SpO2 11/13/17 1536 98 %     Weight 11/13/17 1537 88.5 kg (195 lb)     Height 11/13/17 1537 1.753 m (5\' 9" )     Head Circumference --      Peak Flow --      Pain Score 11/13/17 1537 8     Pain Loc --      Pain Edu? --      Excl. in GC? --     Constitutional: Alert and oriented. Well appearing and in no acute distress. Eyes: Conjunctivae are normal. Head: Atraumatic. Mouth/Throat: Mucous membranes are moist.  Oropharynx non-erythematous. Neck: No stridor.    Cardiovascular: Normal rate, regular rhythm. Good peripheral circulation. Grossly normal heart sounds. Respiratory: Normal respiratory effort.  No retractions. Lungs CTAB. Gastrointestinal: Soft and nontender. No distention.  Musculoskeletal: No lower extremity tenderness nor edema. No gross deformities of extremities. Neurologic:  Normal speech and language. No gross focal neurologic deficits are appreciated.  Skin:  Skin is warm, dry and intact. No rash noted. Psychiatric: Mood and affect are normal. Speech and behavior are normal.  ____________________________________________   LABS (all labs ordered are listed, but only abnormal results are displayed)  Labs Reviewed  BASIC METABOLIC PANEL - Abnormal; Notable for the following components:      Result Value   CO2 20 (*)    Glucose, Bld 144 (*)    All other components within normal limits  CBC  TROPONIN I  TROPONIN I   ____________________________________________  EKG  ED ECG REPORT I, [place] Arilla Hice, the attending physician, personally viewed and interpreted this ECG.   Date: 11/13/2017  EKG Time: 3:32 PM  Rate: 74  Rhythm: Normal sinus rhythm  Axis: Normal  Intervals: Normal QTC 399  ST&T Change: None  ____________________________________________  RADIOLOGY I, [place] Abner Ardis, personally viewed and evaluated these images (plain radiographs) as part of my medical decision making, as well as reviewing the written report by the radiologist.  Dg Chest 2 View  Result Date: 11/13/2017 CLINICAL DATA:  Left chest pain EXAM: CHEST  2 VIEW COMPARISON:  November 10, 2017 FINDINGS: The heart size and mediastinal contours are within normal limits. Both lungs are clear. The visualized skeletal structures are unremarkable. IMPRESSION: No active cardiopulmonary disease. Electronically Signed   By: Sherian Rein M.D.   On: 11/13/2017 15:57      Procedures   ____________________________________________   INITIAL  IMPRESSION / ASSESSMENT AND PLAN / ED COURSE  As part of my medical decision making, I reviewed the following data within the electronic MEDICAL RECORD [id number] year old male presenting with above-stated history and physical exam compared to chest pain.  Consider possibility of CAD/MI however EKG revealed no evidence of ST segment elevation or depression laboratory data unremarkable including troponin x2-.  Also consider possibility of pulmonary emboli patient was seen on 11/10/2017 with similar complaint at which time d-dimer was negative.  Given ongoing pain CT PE protocol was performed which revealed no acute abnormality.  I spoke with the patient at length regarding need to follow-up with cardiology for further outpatient evaluation.  Patient was informed of incidental finding of cholelithiasis.  ____________________________________________  FINAL CLINICAL IMPRESSION(S) / ED DIAGNOSES  Final diagnoses:  Chest pain, unspecified type  Gallstones     MEDICATIONS GIVEN DURING THIS VISIT:  Medications  morphine 2 MG/ML injection 2 mg (not administered)  ondansetron (ZOFRAN) injection 4 mg (not administered)     ED Discharge Orders    None       Note:  This document was prepared using Dragon voice recognition software and may include unintentional dictation errors.    Darci CurrentBrown, [place] N, MD 11/15/17 0030

## 2017-11-16 ENCOUNTER — Ambulatory Visit (INDEPENDENT_AMBULATORY_CARE_PROVIDER_SITE_OTHER): Payer: 59 | Admitting: Surgery

## 2017-11-16 ENCOUNTER — Encounter: Payer: Self-pay | Admitting: Surgery

## 2017-11-16 ENCOUNTER — Ambulatory Visit: Payer: 59 | Admitting: Primary Care

## 2017-11-16 ENCOUNTER — Telehealth: Payer: Self-pay

## 2017-11-16 DIAGNOSIS — K8020 Calculus of gallbladder without cholecystitis without obstruction: Secondary | ICD-10-CM

## 2017-11-16 NOTE — H&P (View-Only) (Signed)
Surgical Clinic History and Physical  Referring provider:  Doreene Nest, NP [address] [place], [place] [address]  HISTORY OF PRESENT ILLNESS (HPI):  42 y.o. male presents for evaluation of abdominal pain. Patient reports intermittent post-prandial RUQ abdominal pain with nausea x "many years" after eating fattier foods. He says his last episode was 2 days ago, after eating steak, though he says he was "okay eating spinach and potatoes". Patient never sought evaluation for his abdominal pain, but rather recently presented to [facility] ED for CP and SOB, underwent CT, and was prescribed Prednisone 20 mg for 2 days for pleurisy. He describes his CP and SOB have improved, but his intermittent post-prandial RUQ abdominal pain has persisted. After many years without health insurance, patient says he has been seeing his PMD, who he says has helped control his migraines, and he has an appointment with her tomorrow morning. He otherwise denies fever/chills, emesis, constipation, or diarrhea.  PAST MEDICAL HISTORY (PMH):  Past Medical History:  Diagnosis Date  . Asthma   . Cervical spine degeneration   . Chicken pox   . Chronic back pain   . Frequent headaches   . Migraine      PAST SURGICAL HISTORY (PSH):  No past surgical history on file.   MEDICATIONS:  Prior to Admission medications   Medication Sig Start Date End Date Taking? Authorizing Provider  amitriptyline (ELAVIL) 50 MG tablet Take 1 tablet (50 mg total) by mouth at bedtime. 06/22/16   Doreene Nest, NP  famotidine (PEPCID) 20 MG tablet Take 1 tablet (20 mg total) by mouth 2 (two) times daily. 11/10/17   Sharman Cheek, MD  ketorolac (TORADOL) 10 MG tablet Take 1 tablet (10 mg total) by mouth every 6 (six) hours as needed for moderate pain. 11/10/17   Sharman Cheek, MD  ondansetron (ZOFRAN ODT) 4 MG disintegrating tablet Take 1 tablet (4 mg total) by mouth every 8 (eight) hours as needed for nausea or vomiting. 11/13/17    Darci Current, MD  oxyCODONE-acetaminophen (PERCOCET/ROXICET) 5-325 MG tablet Take 1 tablet by mouth every 4 (four) hours as needed for severe pain. 11/13/17   Darci Current, MD  predniSONE (DELTASONE) 20 MG tablet Take 2 tablets (40 mg total) by mouth daily. 11/10/17   Sharman Cheek, MD  SUMAtriptan (IMITREX) 100 MG tablet Take 1 tablet at migraine onset. May repeat in 2 hours if no resolve. Do not exceed 2 tablets in 24 hours. 07/13/16   Doreene Nest, NP     ALLERGIES:  Allergies  Allergen Reactions  . Sulfa Antibiotics Other (See Comments)  . Vicodin [Hydrocodone-Acetaminophen]      SOCIAL HISTORY:  Social History   Socioeconomic History  . Marital status: Single    Spouse name: Not on file  . Number of children: Not on file  . Years of education: Not on file  . Highest education level: Not on file  Social Needs  . Financial resource strain: Not on file  . Food insecurity - worry: Not on file  . Food insecurity - inability: Not on file  . Transportation needs - medical: Not on file  . Transportation needs - non-medical: Not on file  Occupational History  . Not on file  Tobacco Use  . Smoking status: Current Every Day Smoker    Packs/day: 1.00    Types: Cigarettes  . Smokeless tobacco: Never Used  Substance and Sexual Activity  . Alcohol use: Yes    Comment:  soical  . Drug use: Not on file  . Sexual activity: Not on file  Other Topics Concern  . Not on file  Social History Narrative   Single.   4 children.   Works as a Retail bankerservice technician.   Highest level of education: GED.    The patient currently resides (home / rehab facility / nursing home): Home The patient normally is (ambulatory / bedbound): Ambulatory  FAMILY HISTORY:  Family History  Problem Relation Age of Onset  . Arthritis Mother   . Heart disease Mother   . Hypertension Mother   . Alcohol abuse Father   . ALS Father   . Rheum arthritis Maternal Grandmother     Otherwise  negative/non-contributory.  REVIEW OF SYSTEMS:  Constitutional: denies any other weight loss, fever, chills, or sweats  Eyes: denies any other vision changes, history of eye injury  ENT: denies sore throat, hearing problems  Respiratory: denies shortness of breath, wheezing  Cardiovascular: denies chest pain, palpitations  Gastrointestinal: abdominal pain, N/V, and bowel function as per HPI Musculoskeletal: denies any other joint pains or cramps  Skin: Denies any other rashes or skin discolorations Neurological: denies any other headache, dizziness, weakness  Psychiatric: Denies any other depression, anxiety   All other review of systems were otherwise negative   VITAL SIGNS:  @VSRANGES @             PHYSICAL EXAM:  Constitutional:  -- Normal body habitus  -- Awake, alert, and oriented x3  Eyes:  -- Pupils equally round and reactive to light  -- No scleral icterus  Ear, nose, throat:  -- No jugular venous distension -- No nasal drainage, bleeding Pulmonary:  -- No crackles  -- Equal breath sounds bilaterally -- Breathing non-labored at rest Cardiovascular:  -- S1, S2 present  -- No pericardial rubs  Gastrointestinal:  -- Abdomen soft and non-distended with mild RUQ abdominal tenderness to palpation, no guarding/rebound tenderness -- No abdominal masses appreciated, pulsatile or otherwise  Musculoskeletal and Integumentary:  -- Wounds or skin discoloration: None appreciated -- Extremities: B/L UE and LE FROM, hands and feet warm, no edema  Neurologic:  -- Motor function: Intact and symmetric -- Sensation: Intact and symmetric  Labs:  CBC    Component Value Date/Time   WBC 10.5 11/13/2017 1538   RBC 5.36 11/13/2017 1538   HGB 16.8 11/13/2017 1538   HCT 49.0 11/13/2017 1538   PLT 283 11/13/2017 1538   MCV 91.4 11/13/2017 1538   MCH 31.4 11/13/2017 1538   MCHC 34.3 11/13/2017 1538   RDW 13.3 11/13/2017 1538   CMP Latest Ref Rng & Units 11/13/2017 11/10/2017  04/18/2017  Glucose 65 - 99 mg/dL 409(W144(H) 83 96  BUN 6 - 20 mg/dL 15 10 12   Creatinine 0.61 - 1.24 mg/dL 1.191.08 1.470.88 8.291.15  Sodium 135 - 145 mmol/L 138 139 141  Potassium 3.5 - 5.1 mmol/L 3.6 3.8 3.4(L)  Chloride 101 - 111 mmol/L 107 106 106  CO2 22 - 32 mmol/L 20(L) 23 27  Calcium 8.9 - 10.3 mg/dL 9.5 9.2 9.4    Imaging studies:  CTA Chest (11/13/2017) - personally reviewed and discussed with patient 1. No active pulmonary disease. 2. No acute pulmonary embolus. 3. Uncomplicated cholelithiasis.   Assessment/Plan:  42 y.o. male with chronic intermittent post-prandial RUQ abdominal pain and nausea associated with fatty foods, consistent with symptomatic cholelithiasis, complicated by recent CP and SOB, as well as by co-morbidities including asthma, ongoing chronic smoking tobacco abuse, chronic back  pain, migraine headaches, and until recently limited continuity of medical care.   - minimize/avoid fatty foods (meats, cheeses/dairy, fried)  - prefer low-fat vegetables, whole grains, and fruits at least until surgery to avoid pain and confirm diagnosis  - though only 42 years old, appreciate PMD risk stratification and optimization considering recent CP and SOB  - all risks, benefits, and alternatives to cholecystectomy, along with anticipated recovery and restrictions, were discussed with the patient and his girlfriend, all of their questions were answered to their expressed satisfaction, patient expresses he wishes to proceed, and informed consent was obtained.  - if considered by PMD to be low risk for general anesthesia and surgery, will tentatively plan for laparoscopic cholecystectomy Tuesday, 2/5  - smoking cessation discussed with patient and his girlfriend (both) and smoking cessation strongly encouraged  - anticipate return to clinic 2 weeks after above surgery  - instructed to call if any questions or concerns  All of the above recommendations were discussed with the patient and  patient's girlfriend, and all of patient's and his girlfriend's questions were answered to their expressed satisfaction.  Thank you for the opportunity to participate in this patient's care.  -- Scherrie Gerlach Earlene Plater, MD, RPVI [place]: [facility] General [facility] - Partnering for exceptional care. Office: [phone]

## 2017-11-16 NOTE — Telephone Encounter (Signed)
I faxed a medical clearance to his PCP. Awaiting on response.

## 2017-11-16 NOTE — Patient Instructions (Signed)
You have requested to have your gallbladder removed. This will be done on 11/28/2017 at [facility] with Dr. Satira MccallumJason Davis.  You will most likely be out of work 1-2 weeks for this surgery. You will return after your post-op appointment with a lifting restriction for approximately 4 more weeks.  You will be able to eat anything you would like to following surgery. But, start by eating a bland diet and advance this as tolerated. The Gallbladder diet is below, please go as closely by this diet as possible prior to surgery to avoid any further attacks.  Please see the (blue)pre-care form that you have been given today. If you have any questions, please call our office.  Laparoscopic Cholecystectomy Laparoscopic cholecystectomy is surgery to remove the gallbladder. The gallbladder is located in the upper right part of the abdomen, behind the liver. It is a storage sac for bile, which is produced in the liver. Bile aids in the digestion and absorption of fats. Cholecystectomy is often done for inflammation of the gallbladder (cholecystitis). This condition is usually caused by a buildup of gallstones (cholelithiasis) in the gallbladder. Gallstones can block the flow of bile, and that can result in inflammation and pain. In severe cases, emergency surgery may be required. If emergency surgery is not required, you will have time to prepare for the procedure. Laparoscopic surgery is an alternative to open surgery. Laparoscopic surgery has a shorter recovery time. Your common bile duct may also need to be examined during the procedure. If stones are found in the common bile duct, they may be removed. LET [facility]ARE PROVIDER KNOW ABOUT:  Any allergies you have.  All medicines you are taking, including vitamins, herbs, eye drops, creams, and over-the-counter medicines.  Previous problems you or members of your family have had with the use of anesthetics.  Any blood disorders you have.  Previous  surgeries you have had.    Any medical conditions you have. RISKS AND COMPLICATIONS Generally, this is a safe procedure. However, problems may occur, including:  Infection.  Bleeding.  Allergic reactions to medicines.  Damage to other structures or organs.  A stone remaining in the common bile duct.  A bile leak from the cyst duct that is clipped when your gallbladder is removed.  The need to convert to open surgery, which requires a larger incision in the abdomen. This may be necessary if your surgeon thinks that it is not safe to continue with a laparoscopic procedure. BEFORE THE PROCEDURE  Ask your health care provider about:  Changing or stopping your regular medicines. This is especially important if you are taking diabetes medicines or blood thinners.  Taking medicines such as aspirin and ibuprofen. These medicines can thin your blood. Do not take these medicines before your procedure if your health care provider instructs you not to.  Follow instructions from your health care provider about eating or drinking restrictions.  Let your health care provider know if you develop a cold or an infection before surgery.  Plan to have someone take you home after the procedure.  Ask your health care provider how your surgical site will be marked or identified.  You may be given antibiotic medicine to help prevent infection. PROCEDURE  To reduce your risk of infection:  Your health care team will wash or sanitize their hands.  Your skin will be washed with soap.  An IV tube may be inserted into one of your veins.  You will be given a medicine to  make you fall asleep (general anesthetic).  A breathing tube will be placed in your mouth.  The surgeon will make several small cuts (incisions) in your abdomen.  A thin, lighted tube (laparoscope) that has a tiny camera on the end will be inserted through one of the small incisions. The camera on the laparoscope will send a  picture to a TV screen (monitor) in the operating room. This will give the surgeon a good view inside your abdomen.  A gas will be pumped into your abdomen. This will expand your abdomen to give the surgeon more room to perform the surgery.  Other tools that are needed for the procedure will be inserted through the other incisions. The gallbladder will be removed through one of the incisions.  After your gallbladder has been removed, the incisions will be closed with stitches (sutures), staples, or skin glue.  Your incisions may be covered with a bandage (dressing). The procedure may vary among health care providers and hospitals. AFTER THE PROCEDURE  Your blood pressure, heart rate, breathing rate, and blood oxygen level will be monitored often until the medicines you were given have worn off.  You will be given medicines as needed to control your pain.   This information is not intended to replace advice given to you by your health care provider. Make sure you discuss any questions you have with your health care provider.   Document Released: 10/10/2005 Document Revised: 07/01/2015 Document Reviewed: 05/22/2013 Elsevier Interactive Patient Education 2016 Elsevier Inc.   Low-Fat Diet for Gallbladder Conditions A low-fat diet can be helpful if you have pancreatitis or a gallbladder condition. With these conditions, your pancreas and gallbladder have trouble digesting fats. A healthy eating plan with less fat will help rest your pancreas and gallbladder and reduce your symptoms. WHAT DO I NEED TO KNOW ABOUT THIS DIET?  Eat a low-fat diet.  Reduce your fat intake to less than 20-30% of your total daily calories. This is less than 50-60 g of fat per day.  Remember that you need some fat in your diet. Ask your dietician what your daily goal should be.  Choose nonfat and low-fat healthy foods. Look for the words "nonfat," "low fat," or "fat free."  As a guide, look on the label and  choose foods with less than 3 g of fat per serving. Eat only one serving.  Avoid alcohol.  Do not smoke. If you need help quitting, talk with your health care provider.  Eat small frequent meals instead of three large heavy meals. WHAT FOODS CAN I EAT? Grains Include healthy grains and starches such as potatoes, wheat bread, fiber-rich cereal, and brown rice. Choose whole grain options whenever possible. In adults, whole grains should account for 45-65% of your daily calories.  Fruits and Vegetables Eat plenty of fruits and vegetables. Fresh fruits and vegetables add fiber to your diet. Meats and Other Protein Sources Eat lean meat such as chicken and pork. Trim any fat off of meat before cooking it. Eggs, fish, and beans are other sources of protein. In adults, these foods should account for 10-35% of your daily calories. Dairy Choose low-fat milk and dairy options. Dairy includes fat and protein, as well as calcium.  Fats and Oils Limit high-fat foods such as fried foods, sweets, baked goods, sugary drinks.  Other Creamy sauces and condiments, such as mayonnaise, can add extra fat. Think about whether or not you need to use them, or use smaller amounts or low fat  options. WHAT FOODS ARE NOT RECOMMENDED?  High fat foods, such as:  [employer].  Ice cream.  [place] toast.  Sweet rolls.  Pizza.  Cheese bread.  Foods covered with batter, butter, creamy sauces, or cheese.  Fried foods.  Sugary drinks and desserts.  Foods that cause gas or bloating   This information is not intended to replace advice given to you by your health care provider. Make sure you discuss any questions you have with your health care provider.   Document Released: 10/15/2013 Document Reviewed: 10/15/2013 Elsevier Interactive Patient Education [employer].

## 2017-11-16 NOTE — Progress Notes (Signed)
Surgical Clinic History and Physical  Referring provider:  Doreene Nest, NP [address] [place], [place] [address]  HISTORY OF PRESENT ILLNESS (HPI):  42 y.o. male presents for evaluation of abdominal pain. Patient reports intermittent post-prandial RUQ abdominal pain with nausea x "many years" after eating fattier foods. He says his last episode was 2 days ago, after eating steak, though he says he was "okay eating spinach and potatoes". Patient never sought evaluation for his abdominal pain, but rather recently presented to [facility] ED for CP and SOB, underwent CT, and was prescribed Prednisone 20 mg for 2 days for pleurisy. He describes his CP and SOB have improved, but his intermittent post-prandial RUQ abdominal pain has persisted. After many years without health insurance, patient says he has been seeing his PMD, who he says has helped control his migraines, and he has an appointment with her tomorrow morning. He otherwise denies fever/chills, emesis, constipation, or diarrhea.  PAST MEDICAL HISTORY (PMH):  Past Medical History:  Diagnosis Date  . Asthma   . Cervical spine degeneration   . Chicken pox   . Chronic back pain   . Frequent headaches   . Migraine      PAST SURGICAL HISTORY (PSH):  No past surgical history on file.   MEDICATIONS:  Prior to Admission medications   Medication Sig Start Date End Date Taking? Authorizing Provider  amitriptyline (ELAVIL) 50 MG tablet Take 1 tablet (50 mg total) by mouth at bedtime. 06/22/16   Doreene Nest, NP  famotidine (PEPCID) 20 MG tablet Take 1 tablet (20 mg total) by mouth 2 (two) times daily. 11/10/17   Sharman Cheek, MD  ketorolac (TORADOL) 10 MG tablet Take 1 tablet (10 mg total) by mouth every 6 (six) hours as needed for moderate pain. 11/10/17   Sharman Cheek, MD  ondansetron (ZOFRAN ODT) 4 MG disintegrating tablet Take 1 tablet (4 mg total) by mouth every 8 (eight) hours as needed for nausea or vomiting. 11/13/17    Darci Current, MD  oxyCODONE-acetaminophen (PERCOCET/ROXICET) 5-325 MG tablet Take 1 tablet by mouth every 4 (four) hours as needed for severe pain. 11/13/17   Darci Current, MD  predniSONE (DELTASONE) 20 MG tablet Take 2 tablets (40 mg total) by mouth daily. 11/10/17   Sharman Cheek, MD  SUMAtriptan (IMITREX) 100 MG tablet Take 1 tablet at migraine onset. May repeat in 2 hours if no resolve. Do not exceed 2 tablets in 24 hours. 07/13/16   Doreene Nest, NP     ALLERGIES:  Allergies  Allergen Reactions  . Sulfa Antibiotics Other (See Comments)  . Vicodin [Hydrocodone-Acetaminophen]      SOCIAL HISTORY:  Social History   Socioeconomic History  . Marital status: Single    Spouse name: Not on file  . Number of children: Not on file  . Years of education: Not on file  . Highest education level: Not on file  Social Needs  . Financial resource strain: Not on file  . Food insecurity - worry: Not on file  . Food insecurity - inability: Not on file  . Transportation needs - medical: Not on file  . Transportation needs - non-medical: Not on file  Occupational History  . Not on file  Tobacco Use  . Smoking status: Current Every Day Smoker    Packs/day: 1.00    Types: Cigarettes  . Smokeless tobacco: Never Used  Substance and Sexual Activity  . Alcohol use: Yes    Comment:  soical  . Drug use: Not on file  . Sexual activity: Not on file  Other Topics Concern  . Not on file  Social History Narrative   Single.   4 children.   Works as a Retail bankerservice technician.   Highest level of education: GED.    The patient currently resides (home / rehab facility / nursing home): Home The patient normally is (ambulatory / bedbound): Ambulatory  FAMILY HISTORY:  Family History  Problem Relation Age of Onset  . Arthritis Mother   . Heart disease Mother   . Hypertension Mother   . Alcohol abuse Father   . ALS Father   . Rheum arthritis Maternal Grandmother     Otherwise  negative/non-contributory.  REVIEW OF SYSTEMS:  Constitutional: denies any other weight loss, fever, chills, or sweats  Eyes: denies any other vision changes, history of eye injury  ENT: denies sore throat, hearing problems  Respiratory: denies shortness of breath, wheezing  Cardiovascular: denies chest pain, palpitations  Gastrointestinal: abdominal pain, N/V, and bowel function as per HPI Musculoskeletal: denies any other joint pains or cramps  Skin: Denies any other rashes or skin discolorations Neurological: denies any other headache, dizziness, weakness  Psychiatric: Denies any other depression, anxiety   All other review of systems were otherwise negative   VITAL SIGNS:  @VSRANGES @             PHYSICAL EXAM:  Constitutional:  -- Normal body habitus  -- Awake, alert, and oriented x3  Eyes:  -- Pupils equally round and reactive to light  -- No scleral icterus  Ear, nose, throat:  -- No jugular venous distension -- No nasal drainage, bleeding Pulmonary:  -- No crackles  -- Equal breath sounds bilaterally -- Breathing non-labored at rest Cardiovascular:  -- S1, S2 present  -- No pericardial rubs  Gastrointestinal:  -- Abdomen soft and non-distended with mild RUQ abdominal tenderness to palpation, no guarding/rebound tenderness -- No abdominal masses appreciated, pulsatile or otherwise  Musculoskeletal and Integumentary:  -- Wounds or skin discoloration: None appreciated -- Extremities: B/L UE and LE FROM, hands and feet warm, no edema  Neurologic:  -- Motor function: Intact and symmetric -- Sensation: Intact and symmetric  Labs:  CBC    Component Value Date/Time   WBC 10.5 11/13/2017 1538   RBC 5.36 11/13/2017 1538   HGB 16.8 11/13/2017 1538   HCT 49.0 11/13/2017 1538   PLT 283 11/13/2017 1538   MCV 91.4 11/13/2017 1538   MCH 31.4 11/13/2017 1538   MCHC 34.3 11/13/2017 1538   RDW 13.3 11/13/2017 1538   CMP Latest Ref Rng & Units 11/13/2017 11/10/2017  04/18/2017  Glucose 65 - 99 mg/dL 409(W144(H) 83 96  BUN 6 - 20 mg/dL 15 10 12   Creatinine 0.61 - 1.24 mg/dL 1.191.08 1.470.88 8.291.15  Sodium 135 - 145 mmol/L 138 139 141  Potassium 3.5 - 5.1 mmol/L 3.6 3.8 3.4(L)  Chloride 101 - 111 mmol/L 107 106 106  CO2 22 - 32 mmol/L 20(L) 23 27  Calcium 8.9 - 10.3 mg/dL 9.5 9.2 9.4    Imaging studies:  CTA Chest (11/13/2017) - personally reviewed and discussed with patient 1. No active pulmonary disease. 2. No acute pulmonary embolus. 3. Uncomplicated cholelithiasis.   Assessment/Plan:  42 y.o. male with chronic intermittent post-prandial RUQ abdominal pain and nausea associated with fatty foods, consistent with symptomatic cholelithiasis, complicated by recent CP and SOB, as well as by co-morbidities including asthma, ongoing chronic smoking tobacco abuse, chronic back  pain, migraine headaches, and until recently limited continuity of medical care.   - minimize/avoid fatty foods (meats, cheeses/dairy, fried)  - prefer low-fat vegetables, whole grains, and fruits at least until surgery to avoid pain and confirm diagnosis  - though only 42 years old, appreciate PMD risk stratification and optimization considering recent CP and SOB  - all risks, benefits, and alternatives to cholecystectomy, along with anticipated recovery and restrictions, were discussed with the patient and his girlfriend, all of their questions were answered to their expressed satisfaction, patient expresses he wishes to proceed, and informed consent was obtained.  - if considered by PMD to be low risk for general anesthesia and surgery, will tentatively plan for laparoscopic cholecystectomy Tuesday, 2/5  - smoking cessation discussed with patient and his girlfriend (both) and smoking cessation strongly encouraged  - anticipate return to clinic 2 weeks after above surgery  - instructed to call if any questions or concerns  All of the above recommendations were discussed with the patient and  patient's girlfriend, and all of patient's and his girlfriend's questions were answered to their expressed satisfaction.  Thank you for the opportunity to participate in this patient's care.  -- Scherrie Gerlach Earlene Plater, MD, RPVI [place]: [facility] General Surgery - Partnering for exceptional care. Office: [phone]

## 2017-11-17 ENCOUNTER — Encounter: Payer: Self-pay | Admitting: Primary Care

## 2017-11-17 ENCOUNTER — Ambulatory Visit (INDEPENDENT_AMBULATORY_CARE_PROVIDER_SITE_OTHER): Payer: 59 | Admitting: Primary Care

## 2017-11-17 DIAGNOSIS — IMO0002 Reserved for concepts with insufficient information to code with codable children: Secondary | ICD-10-CM

## 2017-11-17 DIAGNOSIS — Z01818 Encounter for other preprocedural examination: Secondary | ICD-10-CM

## 2017-11-17 DIAGNOSIS — G43709 Chronic migraine without aura, not intractable, without status migrainosus: Secondary | ICD-10-CM

## 2017-11-17 DIAGNOSIS — I10 Essential (primary) hypertension: Secondary | ICD-10-CM | POA: Diagnosis not present

## 2017-11-17 DIAGNOSIS — G43701 Chronic migraine without aura, not intractable, with status migrainosus: Secondary | ICD-10-CM

## 2017-11-17 MED ORDER — SUMATRIPTAN SUCCINATE 100 MG PO TABS: ORAL_TABLET | ORAL | 0 refills | Status: DC

## 2017-11-17 NOTE — Assessment & Plan Note (Signed)
Without amitriptyline for one year, migraines occurring once monthly on average. Continue off amitriptyline. Refilled Imitrex. He will notify if migraines become more recurrent.

## 2017-11-17 NOTE — Assessment & Plan Note (Addendum)
Exam with generalized tenderness to abdomen, he is in no distress. BP elevated and has been for the past 2 years. Recommended treatment for BP for which he declines.   Will speak with surgeon regarding BP levels and will defer the decision for surgery based off their comfort levels with his current BP.  ECG's are stable and unchanged. Labs stable.  From all other aspects he should do fine under general anesthesia, will defer BP readings to surgeon.

## 2017-11-17 NOTE — Assessment & Plan Note (Signed)
Long history of elevated readings since January 2017. Recommended treatment for hypertension for which he declines as he doesn't feel he has hypertension despite numerous ED visits for chest pain and history of migraines.  BP Readings from Last 3 Encounters:  11/17/17 (!) 152/92  11/13/17 (!) 150/86  11/10/17 (!) 146/89    Strongly advised he stop smoking.   Will touch base with his surgeon to discuss his BP in terms of upcoming surgery.

## 2017-11-17 NOTE — Patient Instructions (Addendum)
Your blood pressure is high. I'm going to speak with your surgeon about your levels.   Stop smoking.  Use the Imitrex only as needed for severe migraines.   Please notify me if you start experiencing migraines more frequently.  It was a pleasure to see you today!

## 2017-11-17 NOTE — Progress Notes (Signed)
Subjective:    Patient ID: Zachary Khan, male    DOB: 05/25/1976, 42 y.o.   MRN: [id number]  HPI  Zachary Khan is a 42 year old male with a history of chronic back pain who presents today for emergency department follow up.  [facility] ED on 11/13/17 with a chief complaint of left sidedchest pain with radiation to left upper extremity. He was also evaluated on 11/10/17 with complaints of right sided chest pain and was diagnosed with pleurisy.   During his visit in the emergency department he underwent chest xray (negative) CTA of the chest which was negative for pulmonary embolus but with uncomplicated cholelithiasis, labs (negative Troponin, CBC, and unremarkable BMP). He was notified of the incidental finding of uncomplicated cholelithiasis and was told to follow up with cardiology.  Since his ED visit he's been evaluated by general surgery. He endorsed chronic post prandial RUQ abdominal pain with nausea after eating fatty foods with his last episode being two days ago. He was advised to quit smoking and to work on improvements in diet. He is scheduled to have a laparoscopic cholecystectomy on 11/28/17 pending surgical clearance.  He has not scheduled an appointment with the cardiologist as he is wanting to get his gall bladder removed first. He's undergone several ECG's within the last month.    2) Migraines: Previously managed on amitriptyline 50 mg for migraine prevention. He's been out of the amitriptyline and Imitrex for the past one year. He's experiencing migraines once monthly on average. He will typically take Tylenol and shut himself into a dark room in order for migraine abortion.   3) Essential Hypertension: History of elevated readings that date back to January 2017. He denies that he has hypertension as he believes his blood pressure is elevated due to his chronic pain. He has been seen in the emergency department for reports of chest pain.  BP Readings from Last 3  Encounters:  11/17/17 (!) 152/92  11/13/17 (!) 150/86  11/10/17 (!) 146/89      Review of Systems  Respiratory: Negative for shortness of breath.   Cardiovascular: Negative for chest pain.  Gastrointestinal: Positive for abdominal pain and nausea. Negative for constipation, diarrhea and vomiting.  Neurological: Negative for headaches.       Past Medical History:  Diagnosis Date  . Asthma   . Cervical spine degeneration   . Chicken pox   . Chronic back pain   . Frequent headaches   . Migraine      Social History   Socioeconomic History  . Marital status: Single    Spouse name: Not on file  . Number of children: Not on file  . Years of education: Not on file  . Highest education level: Not on file  Social Needs  . Financial resource strain: Not on file  . Food insecurity - worry: Not on file  . Food insecurity - inability: Not on file  . Transportation needs - medical: Not on file  . Transportation needs - non-medical: Not on file  Occupational History  . Not on file  Tobacco Use  . Smoking status: Current Every Day Smoker    Packs/day: 1.00    Types: Cigarettes  . Smokeless tobacco: Never Used  Substance and Sexual Activity  . Alcohol use: Yes    Comment: soical  . Drug use: Not on file  . Sexual activity: Not on file  Other Topics Concern  . Not on file  Social History  Narrative   Single.   4 children.   Works as a Retail banker.   Highest level of education: GED.    No past surgical history on file.  Family History  Problem Relation Age of Onset  . Arthritis Mother   . Heart disease Mother   . Hypertension Mother   . Alcohol abuse Father   . ALS Father   . Rheum arthritis Maternal Grandmother     Allergies  Allergen Reactions  . Sulfa Antibiotics Other (See Comments)  . Vicodin [Hydrocodone-Acetaminophen]     Current Outpatient Medications on File Prior to Visit  Medication Sig Dispense Refill  . famotidine (PEPCID) 20 MG tablet  Take 1 tablet (20 mg total) by mouth 2 (two) times daily. 60 tablet 0  . ketorolac (TORADOL) 10 MG tablet Take 1 tablet (10 mg total) by mouth every 6 (six) hours as needed for moderate pain. 12 tablet 0  . ondansetron (ZOFRAN ODT) 4 MG disintegrating tablet Take 1 tablet (4 mg total) by mouth every 8 (eight) hours as needed for nausea or vomiting. 20 tablet 0  . oxyCODONE-acetaminophen (PERCOCET/ROXICET) 5-325 MG tablet Take 1 tablet by mouth every 4 (four) hours as needed for severe pain. 20 tablet 0   No current facility-administered medications on file prior to visit.     BP (!) 152/92   Pulse 75   Temp 98.2 F (36.8 C) (Oral)   Ht 5\' 9"  (1.753 m)   Wt 193 lb 1.9 oz (87.6 kg)   SpO2 96%   BMI 28.52 kg/m    Objective:   Physical Exam  Constitutional: He is oriented to person, place, and time. He appears well-nourished.  HENT:  Right Ear: Tympanic membrane and ear canal normal.  Left Ear: Tympanic membrane and ear canal normal.  Nose: Nose normal. Right sinus exhibits no maxillary sinus tenderness and no frontal sinus tenderness. Left sinus exhibits no maxillary sinus tenderness and no frontal sinus tenderness.  Mouth/Throat: Oropharynx is clear and moist.  Eyes: Conjunctivae and EOM are normal. Pupils are equal, round, and reactive to light.  Neck: Neck supple. Carotid bruit is not present. No thyromegaly present.  Cardiovascular: Normal rate, regular rhythm and normal heart sounds.  Pulmonary/Chest: Effort normal and breath sounds normal. He has no wheezes. He has no rales.  Abdominal: Soft. Normal appearance and bowel sounds are normal. There is generalized tenderness. There is negative Murphy's sign.  Musculoskeletal: Normal range of motion.  Neurological: He is alert and oriented to person, place, and time. He has normal reflexes. No cranial nerve deficit.  Skin: Skin is warm and dry.  Psychiatric: He has a normal mood and affect.          Assessment & Plan:

## 2017-11-20 ENCOUNTER — Telehealth: Payer: Self-pay | Admitting: Surgery

## 2017-11-20 NOTE — Telephone Encounter (Signed)
Pt advised of pre op date/time and sx date. Sx: 11/28/17 with Dr Davis--laparoscopic cholecystectomy.  Pre op: 11/21/17 between 9-1:00pm--phone interview.   Patient made aware to call [phone], between 1-3:00pm the day before surgery, to find out what time to arrive.     Patient has agreed to pay a deposit for surgery of 300-500 prior to surgery.

## 2017-11-21 ENCOUNTER — Other Ambulatory Visit: Payer: Self-pay

## 2017-11-21 ENCOUNTER — Encounter: Payer: Self-pay | Admitting: Emergency Medicine

## 2017-11-21 ENCOUNTER — Telehealth: Payer: Self-pay | Admitting: Primary Care

## 2017-11-21 ENCOUNTER — Emergency Department: Payer: 59

## 2017-11-21 ENCOUNTER — Encounter
Admission: RE | Admit: 2017-11-21 | Discharge: 2017-11-21 | Disposition: A | Discharge status: Home / Self Care | Payer: 59 | Source: Ambulatory Visit | Attending: Surgery | Admitting: Surgery

## 2017-11-21 ENCOUNTER — Telehealth: Payer: Self-pay | Admitting: General Practice

## 2017-11-21 ENCOUNTER — Emergency Department
Admission: EM | Admit: 2017-11-21 | Discharge: 2017-11-21 | Disposition: A | Discharge status: Home / Self Care | Payer: 59 | Attending: Emergency Medicine | Admitting: Emergency Medicine

## 2017-11-21 DIAGNOSIS — I10 Essential (primary) hypertension: Secondary | ICD-10-CM | POA: Diagnosis not present

## 2017-11-21 DIAGNOSIS — J45909 Unspecified asthma, uncomplicated: Secondary | ICD-10-CM | POA: Diagnosis not present

## 2017-11-21 DIAGNOSIS — R1011 Right upper quadrant pain: Secondary | ICD-10-CM | POA: Diagnosis not present

## 2017-11-21 DIAGNOSIS — F17210 Nicotine dependence, cigarettes, uncomplicated: Secondary | ICD-10-CM | POA: Diagnosis not present

## 2017-11-21 DIAGNOSIS — R112 Nausea with vomiting, unspecified: Secondary | ICD-10-CM | POA: Insufficient documentation

## 2017-11-21 DIAGNOSIS — R110 Nausea: Secondary | ICD-10-CM

## 2017-11-21 DIAGNOSIS — K8020 Calculus of gallbladder without cholecystitis without obstruction: Secondary | ICD-10-CM | POA: Diagnosis not present

## 2017-11-21 HISTORY — DX: Gastro-esophageal reflux disease without esophagitis: K21.9

## 2017-11-21 LAB — URINALYSIS, COMPLETE (UACMP) WITH MICROSCOPIC
Bacteria, UA: NONE SEEN
Bilirubin Urine: NEGATIVE
Glucose, UA: NEGATIVE mg/dL
HGB URINE DIPSTICK: NEGATIVE
Ketones, ur: NEGATIVE mg/dL
Leukocytes, UA: NEGATIVE
Nitrite: NEGATIVE
Protein, ur: NEGATIVE mg/dL
Specific Gravity, Urine: 1.005 (ref 1.005–1.030)
Squamous Epithelial / LPF: NONE SEEN
pH: 7 (ref 5.0–8.0)

## 2017-11-21 LAB — COMPREHENSIVE METABOLIC PANEL
ALT: 27 U/L (ref 17–63)
ANION GAP: 7 (ref 5–15)
AST: 28 U/L (ref 15–41)
Albumin: 3.9 g/dL (ref 3.5–5.0)
Alkaline Phosphatase: 62 U/L (ref 38–126)
BUN: 7 mg/dL (ref 6–20)
CHLORIDE: 106 mmol/L (ref 101–111)
CO2: 25 mmol/L (ref 22–32)
Calcium: 9 mg/dL (ref 8.9–10.3)
Creatinine, Ser: 0.97 mg/dL (ref 0.61–1.24)
GFR calc non Af Amer: >60 mL/min (ref >60)
Glucose, Bld: 76 mg/dL (ref 65–99)
POTASSIUM: 3.6 mmol/L (ref 3.5–5.1)
SODIUM: 138 mmol/L (ref 135–145)
Total Bilirubin: 0.8 mg/dL (ref 0.3–1.2)
Total Protein: 7 g/dL (ref 6.5–8.1)

## 2017-11-21 LAB — CBC
HEMATOCRIT: 46 % (ref 40.0–52.0)
Hemoglobin: 15.8 g/dL (ref 13.0–18.0)
MCH: 31.6 pg (ref 26.0–34.0)
MCHC: 34.4 g/dL (ref 32.0–36.0)
MCV: 92 fL (ref 80.0–100.0)
Platelets: 270 10*3/uL (ref 150–440)
RBC: 5 MIL/uL (ref 4.40–5.90)
RDW: 13.1 % (ref 11.5–14.5)
WBC: 8.4 10*3/uL (ref 3.8–10.6)

## 2017-11-21 LAB — LIPASE, BLOOD: LIPASE: 51 U/L (ref 11–51)

## 2017-11-21 MED ORDER — OXYCODONE-ACETAMINOPHEN 5-325 MG PO TABS: 1.0000 | ORAL_TABLET | ORAL | 0 refills | Status: DC | PRN

## 2017-11-21 MED ORDER — ONDANSETRON HCL 4 MG/2ML IJ SOLN
4.0000 mg | Freq: Once | INTRAMUSCULAR | Status: AC
  Administered 2017-11-21: 4 mg via INTRAVENOUS
  Filled 2017-11-21: qty 2

## 2017-11-21 MED ORDER — OXYCODONE HCL 5 MG PO TABS: 5.0000 mg | ORAL_TABLET | ORAL | 0 refills | Status: DC | PRN

## 2017-11-21 MED ORDER — ONDANSETRON 4 MG PO TBDP: 4.0000 mg | ORAL_TABLET | Freq: Three times a day (TID) | ORAL | 0 refills | Status: DC | PRN

## 2017-11-21 MED ORDER — SODIUM CHLORIDE 0.9 % IV BOLUS (SEPSIS)
1000.0000 mL | Freq: Once | INTRAVENOUS | Status: AC
  Administered 2017-11-21: 1000 mL via INTRAVENOUS

## 2017-11-21 MED ORDER — HYDROMORPHONE HCL 1 MG/ML IJ SOLN
1.0000 mg | Freq: Once | INTRAMUSCULAR | Status: AC
  Administered 2017-11-21: 1 mg via INTRAVENOUS
  Filled 2017-11-21: qty 1

## 2017-11-21 NOTE — ED Notes (Signed)
Pt c/o RUQ pain that radiates into the RLQ for the past week. States he is scheduled to have his gall bladder removed next Tuesday by [facility]. Pt states he is out of pain meds and couldn't wait another week. C/o nausea, denies vomiting or diarrhea..Marland Kitchen

## 2017-11-21 NOTE — Telephone Encounter (Signed)
Please notify patient that he can take Extra Strength Tylenol as needed for pain, not to exceed 3000 mg in 24 hours. Otherwise I recommend he consult with his surgeon for further pain management.

## 2017-11-21 NOTE — Discharge Instructions (Signed)
Please drink plenty of fluid to stay well-hydrated.  Please take a bland diet to prevent worsening of your pain.  You may take 1-2 500 mg Tylenol tablets by mouth every 8 hours as needed for pain.  Oxycodone is for severe breakthrough pain.  You may not drive within 8 hours of taking oxycodone.  Return to the emergency department if you develop severe pain, lightheadedness or fainting, fever, inability to keep down fluids, or any other symptoms concerning to you.

## 2017-11-21 NOTE — Telephone Encounter (Signed)
Copied from CRM [id number]. Topic: Quick Communication - See Telephone Encounter >> Nov 21, 2017 10:07 AM Zachary Khan, Rosey Batheresa D wrote: CRM for notification. See Telephone encounter for: 11/21/17. Patient called said that he has surgery next Tuesday and he is in pain and wants to know if Vernona RiegerKatherine Clark can call him in something to his pharmacy. Please call patient back, thanks.

## 2017-11-21 NOTE — Telephone Encounter (Signed)
Spoke with Dr.Woodham and patient should go to the emergency room due to pain level at 8.  Called patient and instructed patient to be seen in the emergency room. Patient stated ok.

## 2017-11-21 NOTE — Telephone Encounter (Signed)
Patient's calling asking if we could call some pain medication called into the pharmacy,patient said his pain level is about a eight. Patient is having some nausea. Patient denies having a fever. Please call patient and advise.

## 2017-11-21 NOTE — Telephone Encounter (Signed)
Spoken and notified patient of Kate's comments. Patient verbalized understanding. 

## 2017-11-21 NOTE — ED Notes (Signed)
Topez not working 

## 2017-11-21 NOTE — Telephone Encounter (Signed)
Spoke with Herbert SetaHeather in pre-admit regarding Cardiac Clearance. [facility] due to chest pain noted at emergency visit 11/13/17.  Spoke with patient and confirmed appointment below.  Dr.Callwood 11/23/17 @ 3:[facility] pm at [facility].  [facility]

## 2017-11-21 NOTE — ED Triage Notes (Signed)
Pt reports that he is scheduled to get his Gall Bladder out on Tuesday. He states that he ran out of Percocet and Tordol last night. He called his PMD, they told him to call his surgeon and the surgeon told him to come here. Pt reports that he is nauseated

## 2017-11-21 NOTE — Pre-Procedure Instructions (Signed)
INITIAL IMPRESSION / ASSESSMENT AND PLAN / ED COURSE  As part of my medical decision making, I reviewed the following data within the electronic MEDICAL RECORD [id number] year old male presenting with above-stated history and physical exam compared to chest pain.  Consider possibility of CAD/MI however EKG revealed no evidence of ST segment elevation or depression laboratory data unremarkable including troponin x2-.  Also consider possibility of pulmonary emboli patient was seen on 11/10/2017 with similar complaint at which time d-dimer was negative.  Given ongoing pain CT PE protocol was performed which revealed no acute abnormality.  I spoke with the patient at length regarding need to follow-up with cardiology for further outpatient evaluation.  Patient was informed of incidental finding of cholelithiasis.  ____________________________________________  FINAL CLINICAL IMPRESSION(S) / ED DIAGNOSES  Final diagnoses:  Chest pain, unspecified type  Gallstones     MEDICATIONS GIVEN DURING THIS VISIT:  Medications  morphine 2 MG/ML injection 2 mg (not administered)  ondansetron (ZOFRAN) injection 4 mg (not administered)        ED Discharge Orders    None       Note:  This document was prepared using Dragon voice recognition software and may include unintentional dictation errors.    Darci CurrentBrown, [place] N, MD 11/15/17 0030           Electronically signed by Darci CurrentBrown, Anza N, MD at 11/15/2017 12:30 AM     ED on 11/13/2017        Detailed Report

## 2017-11-21 NOTE — Telephone Encounter (Signed)
Medical Clearance obtained and approved at this time. Medical clearance can be found in patient's chart under media tab. 

## 2017-11-21 NOTE — ED Provider Notes (Signed)
[facility] Emergency Department Provider Note  ____________________________________________  Time seen: Approximately 5:49 PM  I have reviewed the triage vital signs and the nursing notes.   HISTORY  Chief Complaint Abdominal Pain    HPI Zachary Khan is a 42 y.o. male with known gallbladder disease and date for cholecystectomy scheduled for next week presenting with right upper quadrant pain, nausea.  The patient reports that he was at work yesterday, and has not eaten any fried or fatty foods, when he developed a dull right upper quadrant pain with intermittent sharp pains.  This is typical of his gallbladder pain.  He has been severely nauseated but not has not had any vomiting.  No fevers or chills.  The pain radiates downwards.  He has tried 2 extra strength Tylenol for his pain as suggested by his PMD, which has not helped.  Past Medical History:  Diagnosis Date  . Asthma    AS A CHILD-NO INHALERS  . Cervical spine degeneration   . Chicken pox   . Chronic back pain   . Frequent headaches   . GERD (gastroesophageal reflux disease)   . Migraine     Patient Active Problem List   Diagnosis Date Noted  . Essential hypertension 11/17/2017  . Pre-operative clearance 11/17/2017  . Migraines 06/01/2016  . Chronic back pain 06/01/2016  . Chronic neck pain 06/01/2016    Past Surgical History:  Procedure Laterality Date  . NO PAST SURGERIES      Current Outpatient Rx  . Order #: [id number] Class: Print  . Order #: [id number] Class: Print  . Order #: [id number] Class: Print  . Order #: [id number] Class: Print  . Order #: [id number] Class: Print    Allergies Sulfa antibiotics and Vicodin [hydrocodone-acetaminophen]  Family History  Problem Relation Age of Onset  . Arthritis Mother   . Heart disease Mother   . Hypertension Mother   . Alcohol abuse Father   . ALS Father   . Rheum arthritis Maternal Grandmother     Social History Social History    Tobacco Use  . Smoking status: Current Every Day Smoker    Packs/day: 0.25    Years: 20.00    Pack years: 5.00    Types: Cigarettes  . Smokeless tobacco: Never Used  Substance Use Topics  . Alcohol use: Yes    Comment: 2 BEERS ON WEEKEND  . Drug use: Yes    Types: Marijuana    Comment: OCC-NOT EVERYDAY    Review of Systems Constitutional: No fever/chills.  No lightheadedness or syncope. Eyes: No visual changes. ENT: No sore throat. No congestion or rhinorrhea. Cardiovascular: Denies chest pain. Denies palpitations. Respiratory: Denies shortness of breath.  No cough. Gastrointestinal: Positive right upper quadrant abdominal pain.  Positive nausea, no vomiting.  No diarrhea.  No constipation. Genitourinary: Negative for dysuria. Musculoskeletal: Negative for back pain. Skin: Negative for rash. Neurological: Negative for headaches. No focal numbness, tingling or weakness.     ____________________________________________   PHYSICAL EXAM:  VITAL SIGNS: ED Triage Vitals  Enc Vitals Group     BP 11/21/17 1620 (!) 138/96     Pulse Rate 11/21/17 1620 85     Resp 11/21/17 1620 (!) 24     Temp 11/21/17 1620 98.8 F (37.1 C)     Temp Source 11/21/17 1620 Oral     SpO2 11/21/17 1620 98 %     Weight 11/21/17 1621 193 lb (87.5 kg)     Height 11/21/17 1621 5\' 9"  (1.753  m)     Head Circumference --      Peak Flow --      Pain Score 11/21/17 1621 8     Pain Loc --      Pain Edu? --      Excl. in GC? --     Constitutional: Alert and oriented.Answers questions appropriately.  The patient is uncomfortable appearing and holding his right side.  He is nontoxic. Eyes: Conjunctivae are normal.  EOMI. No scleral icterus. Head: Atraumatic. Nose: No congestion/rhinnorhea. Mouth/Throat: Mucous membranes are moist.  Neck: No stridor.  Supple.  No JVD.  No meningismus. Cardiovascular: Normal rate, regular rhythm. No murmurs, rubs or gallops.  Respiratory: Normal respiratory effort.   No accessory muscle use or retractions. Lungs CTAB.  No wheezes, rales or ronchi. Gastrointestinal: Soft, and nondistended.  Diffuse tenderness to palpation in the right upper quadrant greater than the left upper quadrant and right lower quadrant.  The patient has a positive Murphy sign.  No guarding or rebound.  No peritoneal signs. Musculoskeletal: No LE edema. No ttp in the calves or palpable cords.  Negative Homan's sign. Neurologic:  A&Ox3.  Speech is clear.  Face and smile are symmetric.  EOMI.  Moves all extremities well. Skin:  Skin is warm, dry and intact. No rash noted. Psychiatric: Mood and affect are normal. Speech and behavior are normal.  Normal judgement.  ____________________________________________   LABS (all labs ordered are listed, but only abnormal results are displayed)  Labs Reviewed  LIPASE, BLOOD  COMPREHENSIVE METABOLIC PANEL  CBC  URINALYSIS, COMPLETE (UACMP) WITH MICROSCOPIC   ____________________________________________  EKG  Not indicated  ____________________________________________  RADIOLOGY  [place] Abdomen Limited Ruq  Result Date: 11/21/2017 CLINICAL DATA:  Right upper quadrant abdominal pain for 1 week. Nausea. EXAM: ULTRASOUND ABDOMEN LIMITED RIGHT UPPER QUADRANT COMPARISON:  None. FINDINGS: Gallbladder: Small stone. No gallbladder wall thickening or evidence of acute cholecystitis. Common bile duct: Diameter: 5 mm Liver: No focal lesion identified. Within normal limits in parenchymal echogenicity. Portal vein is patent on color Doppler imaging with normal direction of blood flow towards the liver. IMPRESSION: 1. No acute findings.  No evidence of acute cholecystitis. 2. Small gallstone.  No other abnormalities. Electronically Signed   By: Amie Portlandavid  Ormond M.D.   On: 11/21/2017 18:28    ____________________________________________   PROCEDURES  Procedure(s) performed: None  Procedures  Critical Care performed:  No ____________________________________________   INITIAL IMPRESSION / ASSESSMENT AND PLAN / ED COURSE  Pertinent labs & imaging results that were available during my care of the patient were reviewed by me and considered in my medical decision making (see chart for details).  42 y.o. male with known gallbladder disease presenting with right upper quadrant pain, nausea.  Overall, the patient is hemodynamically stable and afebrile.  He likely has an acute exacerbation of his gallbladder disease but we will also evaluate him for cholecystitis.  If he does not have an acute infection, my goal today will be symptomatic treatment as he already has a scheduled elective cholecystitis.  Other possible etiologies that are much less likely would be appendicitis, or small bowel obstruction, suppression of small bowel obstruction.  An early GI virus is possible as well.  Plan symptomatic treatment and reevaluation for final disposition.  ----------------------------------------- 7:03 PM on 11/21/2017 -----------------------------------------  The patient's work up in the emergency department has been reassuring.  He has remained hemodynamically stable and afebrile.  His pain has improved and he is able to tolerate  liquids without vomiting.  His ultrasound is not show any evidence of acute cholecystitis; the gallstone remains present.  His liver function tests are within normal limits, his white blood cell count is normal.  There is no evidence for cholecystitis today.  At this time, we will plan to discharge the patient home.  I prescribed him additional oxycodone, and have talked to him about using Tylenol as first line for his pain.  He is requested to be out of work this week as he feels that this exacerbates his symptoms.  I have talked to the patient about follow-up instructions as well as return precautions.  ____________________________________________  FINAL CLINICAL IMPRESSION(S) / ED  DIAGNOSES  Final diagnoses:  Right upper quadrant pain  Nausea without vomiting  Calculus of gallbladder without cholecystitis without obstruction         NEW MEDICATIONS STARTED DURING THIS VISIT:  New Prescriptions   OXYCODONE (ROXICODONE) 5 MG IMMEDIATE RELEASE TABLET    Take 1 tablet (5 mg total) by mouth every 4 (four) hours as needed.      Rockne Menghini, MD 11/21/17 Ebony Cargo

## 2017-11-21 NOTE — Patient Instructions (Signed)
  Your procedure is scheduled on: 11-28-17  Report to Same Day Surgery 2nd floor medical mall [facility](Medical Mall Entrance-take elevator on left to 2nd floor.  Check in with surgery information desk.) To find out your arrival time please call [phone] between 1PM - 3PM on 11-27-17   Remember: Instructions that are not followed completely may result in serious medical risk, up to and including death, or upon the discretion of your surgeon and anesthesiologist your surgery may need to be rescheduled.    _x___ 1. Do not eat food after midnight the night before your procedure. NO GUM OR CANDY AFTER MIDNIGHT.  You may drink clear liquids up to 2 hours before you are scheduled to arrive at the hospital for your procedure.  Do not drink clear liquids within 2 hours of your scheduled arrival to the hospital.  Clear liquids include  --Water or Apple juice without pulp  --Clear carbohydrate beverage such as ClearFast or Gatorade  --Black Coffee or Clear Tea (No milk, no creamers, do not add anything to the coffee or Tea      __x__ 2. No Alcohol for 24 hours before or after surgery.   __x__3. No Smoking for 24 prior to surgery.   ____  4. Bring all medications with you on the day of surgery if instructed.    __x__ 5. Notify your doctor if there is any change in your medical condition     (cold, fever, infections).     Do not wear jewelry, make-up, hairpins, clips or nail polish.  Do not wear lotions, powders, or perfumes. You may wear deodorant.  Do not shave 48 hours prior to surgery. Men may shave face and neck.  Do not bring valuables to the hospital.    [facility] is not responsible for any belongings or valuables.               Contacts, dentures or bridgework may not be worn into surgery.  Leave your suitcase in the car. After surgery it may be brought to your room.  For patients admitted to the hospital, discharge time is determined by your treatment team.   Patients discharged the day of  surgery will not be allowed to drive home.  You will need someone to drive you home and stay with you the night of your procedure.    Please read over the following fact sheets that you were given:   [facility] Preparing for Surgery and or MRSA Information   ____ Take anti-hypertensive listed below, cardiac, seizure, asthma, anti-reflux and psychiatric medicines. These include:  1. NONE  2.  3.  4.  5.  6.  ____Fleets enema or Magnesium Citrate as directed.   ____ Use CHG Soap or sage wipes as directed on instruction sheet   ____ Use inhalers on the day of surgery and bring to hospital day of surgery  ____ Stop Metformin and Janumet 2 days prior to surgery.    ____ Take 1/2 of usual insulin dose the night before surgery and none on the morning surgery.   ____ Follow recommendations from Cardiologist, Pulmonologist or PCP regarding stopping Aspirin, Coumadin, Plavix ,Eliquis, Effient, or Pradaxa, and Pletal.  X____Stop Anti-inflammatories such as Advil, Aleve, Ibuprofen, Motrin, Naproxen, Naprosyn, Goodies powders or aspirin products NOW- OK to take Tylenol    ____ Stop supplements until after surgery.     ____ Bring C-Pap to the hospital.

## 2017-11-21 NOTE — Pre-Procedure Instructions (Signed)
SPOKE WITH PT REGARDING HIS SURGERY ON 11-28-17 WITH DR Navie Lamoreaux.  PT HAD COME TO THE ED ON 11-13-17 WITH CP AND WAS DIAGNOSED WITH PLEURISY BUT NOTES FROM HIS PCP KATHERINE CLARK STATES THAT THE ED WANTED PT TO F/U WITH CARDIOLOGY AND PT WAS NOT GOING TO DO THAT UNTIL AFTER HIS SURGERY.  I SPOKE WITH PT AND TOLD HIM HE NEEDS TO GO AHEAD AND CALL CARDIOLOGY TO GET AN APPOINTMENT BEFORE HIS SURGERY. PT STATES HE WILL AND I INFORMED SHEILA AT DR Akela Pocius' OFFICE OF THIS AND THEY WILL F/U WITH THIS

## 2017-11-21 NOTE — Telephone Encounter (Signed)
Patient is stating he is having a constant stabbing pain that radiates to his back. He cant sleep.  He denies vomiting, but has nausea. Decrease appetite.  Tried to work last night but left due to persistent pain. Has taken 4 Tylenol and it hasn't helped.

## 2017-11-22 NOTE — Telephone Encounter (Signed)
Medical Clearance obtained and approved at this time. Medical clearance can be found in patient's chart under media tab.

## 2017-11-23 DIAGNOSIS — I208 Other forms of angina pectoris: Secondary | ICD-10-CM | POA: Diagnosis not present

## 2017-11-23 DIAGNOSIS — Z01818 Encounter for other preprocedural examination: Secondary | ICD-10-CM | POA: Diagnosis not present

## 2017-11-23 DIAGNOSIS — R0602 Shortness of breath: Secondary | ICD-10-CM | POA: Diagnosis not present

## 2017-11-24 NOTE — Telephone Encounter (Signed)
Cardiac Clearance obtained and approved at this time. Cardiac clearance can be found in patient's chart under media tab.

## 2017-11-27 MED ORDER — CEFAZOLIN SODIUM-DEXTROSE 2-4 GM/100ML-% IV SOLN: 2.0000 g | INTRAVENOUS | Status: DC

## 2017-11-27 NOTE — Pre-Procedure Instructions (Signed)
CARDIAC CLEARANCE ON CHART FROM DR CALLWOOD.  

## 2017-11-28 ENCOUNTER — Encounter: Payer: Self-pay | Admitting: *Deleted

## 2017-11-28 ENCOUNTER — Ambulatory Visit: Payer: 59 | Admitting: Anesthesiology

## 2017-11-28 ENCOUNTER — Other Ambulatory Visit: Payer: Self-pay

## 2017-11-28 ENCOUNTER — Encounter
Admission: RE | Disposition: A | Discharge status: Home / Self Care | Payer: Self-pay | Source: Ambulatory Visit | Attending: Surgery

## 2017-11-28 ENCOUNTER — Ambulatory Visit
Admission: RE | Admit: 2017-11-28 | Discharge: 2017-11-28 | Disposition: A | Discharge status: Home / Self Care | Payer: 59 | Source: Ambulatory Visit | Attending: Surgery | Admitting: Surgery

## 2017-11-28 DIAGNOSIS — F17210 Nicotine dependence, cigarettes, uncomplicated: Secondary | ICD-10-CM | POA: Insufficient documentation

## 2017-11-28 DIAGNOSIS — I10 Essential (primary) hypertension: Secondary | ICD-10-CM | POA: Insufficient documentation

## 2017-11-28 DIAGNOSIS — K8050 Calculus of bile duct without cholangitis or cholecystitis without obstruction: Secondary | ICD-10-CM | POA: Diagnosis not present

## 2017-11-28 DIAGNOSIS — Z9049 Acquired absence of other specified parts of digestive tract: Secondary | ICD-10-CM

## 2017-11-28 DIAGNOSIS — M1990 Unspecified osteoarthritis, unspecified site: Secondary | ICD-10-CM | POA: Insufficient documentation

## 2017-11-28 DIAGNOSIS — J45909 Unspecified asthma, uncomplicated: Secondary | ICD-10-CM | POA: Insufficient documentation

## 2017-11-28 DIAGNOSIS — K8020 Calculus of gallbladder without cholecystitis without obstruction: Secondary | ICD-10-CM | POA: Diagnosis not present

## 2017-11-28 DIAGNOSIS — Z885 Allergy status to narcotic agent status: Secondary | ICD-10-CM | POA: Diagnosis not present

## 2017-11-28 DIAGNOSIS — Z79899 Other long term (current) drug therapy: Secondary | ICD-10-CM | POA: Diagnosis not present

## 2017-11-28 DIAGNOSIS — G43909 Migraine, unspecified, not intractable, without status migrainosus: Secondary | ICD-10-CM | POA: Diagnosis not present

## 2017-11-28 DIAGNOSIS — K219 Gastro-esophageal reflux disease without esophagitis: Secondary | ICD-10-CM | POA: Diagnosis not present

## 2017-11-28 DIAGNOSIS — Z882 Allergy status to sulfonamides status: Secondary | ICD-10-CM | POA: Insufficient documentation

## 2017-11-28 DIAGNOSIS — I739 Peripheral vascular disease, unspecified: Secondary | ICD-10-CM | POA: Insufficient documentation

## 2017-11-28 HISTORY — PX: CHOLECYSTECTOMY: SHX55

## 2017-11-28 LAB — URINE DRUG SCREEN, QUALITATIVE (ARMC ONLY)
Amphetamines, Ur Screen: NOT DETECTED
BARBITURATES, UR SCREEN: NOT DETECTED
BENZODIAZEPINE, UR SCRN: NOT DETECTED
COCAINE METABOLITE, UR ~~LOC~~: NOT DETECTED
Cannabinoid 50 Ng, Ur ~~LOC~~: POSITIVE — AB
MDMA (Ecstasy)Ur Screen: NOT DETECTED
METHADONE SCREEN, URINE: NOT DETECTED
OPIATE, UR SCREEN: NOT DETECTED
Phencyclidine (PCP) Ur S: NOT DETECTED
Tricyclic, Ur Screen: NOT DETECTED

## 2017-11-28 SURGERY — LAPAROSCOPIC CHOLECYSTECTOMY
Anesthesia: General | Site: Abdomen | Wound class: Clean Contaminated

## 2017-11-28 MED ORDER — MIDAZOLAM HCL 2 MG/2ML IJ SOLN
INTRAMUSCULAR | Status: AC
  Filled 2017-11-28: qty 2

## 2017-11-28 MED ORDER — FENTANYL CITRATE (PF) 100 MCG/2ML IJ SOLN
INTRAMUSCULAR | Status: AC
  Filled 2017-11-28: qty 4

## 2017-11-28 MED ORDER — LIDOCAINE HCL (PF) 1 % IJ SOLN
INTRAMUSCULAR | Status: AC
  Filled 2017-11-28: qty 30

## 2017-11-28 MED ORDER — MIDAZOLAM HCL 2 MG/2ML IJ SOLN
INTRAMUSCULAR | Status: DC | PRN
  Administered 2017-11-28: 2 mg via INTRAVENOUS

## 2017-11-28 MED ORDER — PROPOFOL 10 MG/ML IV BOLUS
INTRAVENOUS | Status: AC
  Filled 2017-11-28: qty 20

## 2017-11-28 MED ORDER — SUGAMMADEX SODIUM 200 MG/2ML IV SOLN
INTRAVENOUS | Status: DC | PRN
  Administered 2017-11-28: 175 mg via INTRAVENOUS

## 2017-11-28 MED ORDER — ONDANSETRON HCL 4 MG/2ML IJ SOLN: 4.0000 mg | Freq: Once | INTRAMUSCULAR | Status: DC | PRN

## 2017-11-28 MED ORDER — KETOROLAC TROMETHAMINE 30 MG/ML IJ SOLN
INTRAMUSCULAR | Status: DC | PRN
  Administered 2017-11-28: 30 mg via INTRAVENOUS

## 2017-11-28 MED ORDER — OXYCODONE HCL 5 MG PO TABS
ORAL_TABLET | ORAL | Status: AC
  Filled 2017-11-28: qty 1

## 2017-11-28 MED ORDER — BUPIVACAINE HCL (PF) 0.5 % IJ SOLN
INTRAMUSCULAR | Status: AC
Start: 2017-11-28 — End: 2017-11-28
  Filled 2017-11-28: qty 30

## 2017-11-28 MED ORDER — ROCURONIUM BROMIDE 100 MG/10ML IV SOLN
INTRAVENOUS | Status: DC | PRN
  Administered 2017-11-28: 40 mg via INTRAVENOUS
  Administered 2017-11-28 (×2): 10 mg via INTRAVENOUS

## 2017-11-28 MED ORDER — OXYCODONE HCL 5 MG PO TABS
5.0000 mg | ORAL_TABLET | Freq: Once | ORAL | Status: AC
  Administered 2017-11-28 (×2): 5 mg via ORAL

## 2017-11-28 MED ORDER — CEFAZOLIN SODIUM-DEXTROSE 2-4 GM/100ML-% IV SOLN
INTRAVENOUS | Status: AC
  Filled 2017-11-28: qty 100

## 2017-11-28 MED ORDER — ACETAMINOPHEN 10 MG/ML IV SOLN
INTRAVENOUS | Status: AC
Start: 2017-11-28 — End: 2017-11-28
  Filled 2017-11-28: qty 100

## 2017-11-28 MED ORDER — ONDANSETRON HCL 4 MG/2ML IJ SOLN
INTRAMUSCULAR | Status: DC | PRN
  Administered 2017-11-28: 4 mg via INTRAVENOUS

## 2017-11-28 MED ORDER — DEXAMETHASONE SODIUM PHOSPHATE 10 MG/ML IJ SOLN
INTRAMUSCULAR | Status: DC | PRN
  Administered 2017-11-28: 10 mg via INTRAVENOUS

## 2017-11-28 MED ORDER — CHLORHEXIDINE GLUCONATE CLOTH 2 % EX PADS
6.0000 | MEDICATED_PAD | Freq: Once | CUTANEOUS | Status: AC
  Administered 2017-11-28: 6 via TOPICAL

## 2017-11-28 MED ORDER — FENTANYL CITRATE (PF) 100 MCG/2ML IJ SOLN
INTRAMUSCULAR | Status: AC
  Administered 2017-11-28: 25 ug via INTRAVENOUS
  Filled 2017-11-28: qty 2

## 2017-11-28 MED ORDER — OXYCODONE HCL 5 MG PO TABS: 5.0000 mg | ORAL_TABLET | ORAL | 0 refills | Status: DC | PRN

## 2017-11-28 MED ORDER — LACTATED RINGERS IV SOLN
INTRAVENOUS | Status: DC
  Administered 2017-11-28 (×2): via INTRAVENOUS

## 2017-11-28 MED ORDER — FENTANYL CITRATE (PF) 100 MCG/2ML IJ SOLN
25.0000 ug | INTRAMUSCULAR | Status: DC | PRN
  Administered 2017-11-28 (×4): 25 ug via INTRAVENOUS

## 2017-11-28 MED ORDER — FENTANYL CITRATE (PF) 100 MCG/2ML IJ SOLN
INTRAMUSCULAR | Status: DC | PRN
  Administered 2017-11-28 (×2): 100 ug via INTRAVENOUS
  Administered 2017-11-28: 50 ug via INTRAVENOUS

## 2017-11-28 MED ORDER — LIDOCAINE HCL (CARDIAC) 20 MG/ML IV SOLN
INTRAVENOUS | Status: DC | PRN
  Administered 2017-11-28: 40 mg via INTRAVENOUS

## 2017-11-28 MED ORDER — ACETAMINOPHEN 10 MG/ML IV SOLN
INTRAVENOUS | Status: DC | PRN
  Administered 2017-11-28: 1000 mg via INTRAVENOUS

## 2017-11-28 MED ORDER — ONDANSETRON HCL 4 MG/2ML IJ SOLN
INTRAMUSCULAR | Status: AC
  Filled 2017-11-28: qty 2

## 2017-11-28 MED ORDER — PROPOFOL 10 MG/ML IV BOLUS
INTRAVENOUS | Status: DC | PRN
  Administered 2017-11-28: 160 mg via INTRAVENOUS

## 2017-11-28 MED ORDER — LABETALOL HCL 5 MG/ML IV SOLN
INTRAVENOUS | Status: DC | PRN
  Administered 2017-11-28: 5 mg via INTRAVENOUS

## 2017-11-28 MED ORDER — LABETALOL HCL 5 MG/ML IV SOLN
INTRAVENOUS | Status: AC
  Filled 2017-11-28: qty 4

## 2017-11-28 MED ORDER — CHLORHEXIDINE GLUCONATE CLOTH 2 % EX PADS: 6.0000 | MEDICATED_PAD | Freq: Once | CUTANEOUS | Status: DC

## 2017-11-28 MED ORDER — FAMOTIDINE 20 MG PO TABS: 20.0000 mg | ORAL_TABLET | Freq: Once | ORAL | Status: DC

## 2017-11-28 MED ORDER — HYDROMORPHONE HCL 1 MG/ML IJ SOLN
0.2500 mg | Freq: Once | INTRAMUSCULAR | Status: AC
  Administered 2017-11-28: 0.5 mg via INTRAVENOUS

## 2017-11-28 MED ORDER — OXYCODONE HCL 5 MG PO TABS: 5.0000 mg | ORAL_TABLET | ORAL | Status: DC | PRN

## 2017-11-28 MED ORDER — HYDROMORPHONE HCL 1 MG/ML IJ SOLN
0.2500 mg | INTRAMUSCULAR | Status: DC | PRN
  Administered 2017-11-28 (×3): 0.5 mg via INTRAVENOUS

## 2017-11-28 MED ORDER — LIDOCAINE HCL 1 % IJ SOLN
INTRAMUSCULAR | Status: DC | PRN
  Administered 2017-11-28: 20 mL via INTRAMUSCULAR

## 2017-11-28 MED ORDER — MIDAZOLAM HCL 2 MG/2ML IJ SOLN
INTRAMUSCULAR | Status: AC
  Administered 2017-11-28: 1 mg via INTRAVENOUS
  Filled 2017-11-28: qty 2

## 2017-11-28 MED ORDER — HYDROMORPHONE HCL 1 MG/ML IJ SOLN
INTRAMUSCULAR | Status: AC
  Administered 2017-11-28: 0.5 mg via INTRAVENOUS
  Filled 2017-11-28: qty 1

## 2017-11-28 MED ORDER — SUGAMMADEX SODIUM 200 MG/2ML IV SOLN
INTRAVENOUS | Status: AC
  Filled 2017-11-28: qty 2

## 2017-11-28 MED ORDER — MIDAZOLAM HCL 2 MG/2ML IJ SOLN
1.0000 mg | Freq: Once | INTRAMUSCULAR | Status: AC
  Administered 2017-11-28: 1 mg via INTRAVENOUS

## 2017-11-28 SURGICAL SUPPLY — 42 items
ADH SKN CLS APL DERMABOND .7 (GAUZE/BANDAGES/DRESSINGS) ×1
APPLIER CLIP ROT 10 11.4 M/L (STAPLE) ×2
APR CLP MED LRG 11.4X10 (STAPLE) ×1
BAG SPEC RTRVL LRG 6X4 10 (ENDOMECHANICALS) ×1
CHLORAPREP W/TINT 26ML (MISCELLANEOUS) ×2 IMPLANT
CLIP APPLIE ROT 10 11.4 M/L (STAPLE) ×1 IMPLANT
DECANTER SPIKE VIAL GLASS SM (MISCELLANEOUS) ×3 IMPLANT
DERMABOND ADVANCED (GAUZE/BANDAGES/DRESSINGS) ×1
DERMABOND ADVANCED .7 DNX12 (GAUZE/BANDAGES/DRESSINGS) ×1 IMPLANT
DEVICE PMI PUNCTURE CLOSURE (MISCELLANEOUS) ×2 IMPLANT
DRESSING SURGICEL FIBRLLR 1X2 (HEMOSTASIS) IMPLANT
DRSG SURGICEL FIBRILLAR 1X2 (HEMOSTASIS)
ELECT REM PT RETURN 9FT ADLT (ELECTROSURGICAL) ×2
ELECTRODE REM PT RTRN 9FT ADLT (ELECTROSURGICAL) ×1 IMPLANT
GLOVE BIO SURGEON STRL SZ7 (GLOVE) ×2 IMPLANT
GLOVE BIOGEL PI IND STRL 7.5 (GLOVE) ×1 IMPLANT
GLOVE BIOGEL PI INDICATOR 7.5 (GLOVE) ×1
GOWN STRL REUS W/ TWL LRG LVL3 (GOWN DISPOSABLE) ×3 IMPLANT
GOWN STRL REUS W/TWL LRG LVL3 (GOWN DISPOSABLE) ×4
IRRIGATION STRYKERFLOW (MISCELLANEOUS) IMPLANT
IRRIGATOR STRYKERFLOW (MISCELLANEOUS)
IV NS 1000ML (IV SOLUTION)
IV NS 1000ML BAXH (IV SOLUTION) ×1 IMPLANT
IV SODIUM CHL 0.9% 500ML (IV SOLUTION) ×1 IMPLANT
KIT TURNOVER KIT A (KITS) ×2 IMPLANT
L-HOOK LAP DISP 36CM (ELECTROSURGICAL)
LHOOK LAP DISP 36CM (ELECTROSURGICAL) ×1 IMPLANT
NDL INSUFFLATION 14GA 120MM (NEEDLE) ×1 IMPLANT
NEEDLE HYPO 22GX1.5 SAFETY (NEEDLE) ×2 IMPLANT
NEEDLE INSUFFLATION 14GA 120MM (NEEDLE) ×2 IMPLANT
NS IRRIG 1000ML POUR BTL (IV SOLUTION) ×1 IMPLANT
PACK LAP CHOLECYSTECTOMY (MISCELLANEOUS) ×2 IMPLANT
PENCIL ELECTRO HAND CTR (MISCELLANEOUS) ×2 IMPLANT
POUCH SPECIMEN RETRIEVAL 10MM (ENDOMECHANICALS) ×2 IMPLANT
SCISSORS METZENBAUM CVD 33 (INSTRUMENTS) IMPLANT
SLEEVE ENDOPATH XCEL 5M (ENDOMECHANICALS) ×4 IMPLANT
SUT MNCRL AB 4-0 PS2 18 (SUTURE) ×2 IMPLANT
SUT VICRYL 0 UR6 27IN ABS (SUTURE) ×2 IMPLANT
SUT VICRYL AB 3-0 FS1 BRD 27IN (SUTURE) ×2 IMPLANT
TROCAR XCEL NON-BLD 11X100MML (ENDOMECHANICALS) ×2 IMPLANT
TROCAR XCEL NON-BLD 5MMX100MML (ENDOMECHANICALS) ×2 IMPLANT
TUBING INSUFFLATION (TUBING) ×2 IMPLANT

## 2017-11-28 NOTE — Anesthesia Postprocedure Evaluation (Signed)
Anesthesia Post Note  Patient: Zachary CashingBrian E Khan  Procedure(s) Performed: LAPAROSCOPIC CHOLECYSTECTOMY (N/A Abdomen)  Patient location during evaluation: PACU Anesthesia Type: General Level of consciousness: awake and alert and oriented Pain management: pain level controlled Vital Signs Assessment: post-procedure vital signs reviewed and stable Respiratory status: spontaneous breathing Cardiovascular status: blood pressure returned to baseline Anesthetic complications: no     Last Vitals:  Vitals:   11/28/17 1313 11/28/17 1322  BP: (!) 125/97 118/89  Pulse: 73 71  Resp: 16 19  Temp:    SpO2: 98% 97%    Last Pain:  Vitals:   11/28/17 1322  TempSrc:   PainSc: 8                  Ignatz Deis

## 2017-11-28 NOTE — Anesthesia Preprocedure Evaluation (Signed)
Anesthesia Evaluation  Patient identified by MRN, date of birth, ID band Patient awake    Reviewed: Allergy & Precautions, NPO status , Patient's Chart, lab work & pertinent test results  Airway Mallampati: II  TM Distance: >3 FB     Dental  (+) Chipped   Pulmonary asthma , Current Smoker,    Pulmonary exam normal        Cardiovascular hypertension, Pt. on medications + Peripheral Vascular Disease  Normal cardiovascular exam     Neuro/Psych  Headaches, negative psych ROS   GI/Hepatic Neg liver ROS, GERD  ,  Endo/Other  negative endocrine ROS  Renal/GU negative Renal ROS  negative genitourinary   Musculoskeletal  (+) Arthritis , Osteoarthritis,    Abdominal Normal abdominal exam  (+)   Peds negative pediatric ROS (+)  Hematology negative hematology ROS (+)   Anesthesia Other Findings   Reproductive/Obstetrics                             Anesthesia Physical Anesthesia Plan  ASA: II  Anesthesia Plan: General   Post-op Pain Management:    Induction: Intravenous  PONV Risk Score and Plan:   Airway Management Planned: Oral ETT  Additional Equipment:   Intra-op Plan:   Post-operative Plan: Extubation in OR  Informed Consent: I have reviewed the patients History and Physical, chart, labs and discussed the procedure including the risks, benefits and alternatives for the proposed anesthesia with the patient or authorized representative who has indicated his/her understanding and acceptance.   Dental advisory given  Plan Discussed with: CRNA and Surgeon  Anesthesia Plan Comments:         Anesthesia Quick Evaluation

## 2017-11-28 NOTE — Progress Notes (Signed)
Continues to have pain  Feels like needs to void   Bladder scan done 689

## 2017-11-28 NOTE — Interval H&P Note (Signed)
History and Physical Interval Note:  11/28/2017 10:40 AM  Zachary Khan  has presented today for surgery, with the diagnosis of CHOLELITHIASIS  The various methods of treatment have been discussed with the patient and family. After consideration of risks, benefits and other options for treatment, the patient has consented to  Procedure(s): LAPAROSCOPIC CHOLECYSTECTOMY (N/A) as a surgical intervention .  The patient's history has been reviewed, patient examined, no change in status, stable for surgery.  I have reviewed the patient's chart and labs.  Questions were answered to the patient's satisfaction.     Ancil LinseyJason Evan Rayyan Burley

## 2017-11-28 NOTE — Anesthesia Procedure Notes (Signed)
Date/Time: 11/28/2017 11:07 AM Performed by: Allean Found, CRNA Pre-anesthesia Checklist: Patient identified, Emergency Drugs available, Suction available, Patient being monitored and Timeout performed Patient Re-evaluated:Patient Re-evaluated prior to induction Oxygen Delivery Method: Circle system utilized Preoxygenation: Pre-oxygenation with 100% oxygen Induction Type: IV induction Ventilation: Mask ventilation without difficulty Laryngoscope Size: Mac and 3 Grade View: Grade I Tube size: 7.5 mm Number of attempts: 1 Airway Equipment and Method: Stylet Placement Confirmation: ETT inserted through vocal cords under direct vision,  positive ETCO2 and breath sounds checked- equal and bilateral Secured at: 24 cm Tube secured with: Tape Dental Injury: Teeth and Oropharynx as per pre-operative assessment

## 2017-11-28 NOTE — Discharge Instructions (Signed)
In addition to included general post-operative instructions for Laparoscopic Cholecystectomy,  Diet: Resume home heart healthy diet (as discussed).   Activity: No heavy lifting >20 pounds (children, pets, laundry, garbage) or strenuous activity until follow-up, but light activity and walking are encouraged. Do not drive or drink alcohol if taking narcotic pain medications.  Wound care: 2 days after surgery (Thursday, 2/7)), may shower/get incision wet with soapy water and pat dry (do not rub incisions), but no baths or submerging incision underwater until follow-up.   Medications: Resume all home medications. For mild to moderate pain: acetaminophen (Tylenol) or ibuprofen/naproxen (if no kidney disease). Combining Tylenol with alcohol can substantially increase your risk of causing liver disease. Narcotic pain medications, if prescribed, can be used for severe pain, though may cause nausea, constipation, and drowsiness. Do not combine Tylenol and Percocet (or similar) within a 6 hour period as Percocet (and similar) contain(s) Tylenol. If you do not need the narcotic pain medication, you do not need to fill the prescription.  Call office [phone]) at any time if any questions, worsening pain, fevers/chills, bleeding, drainage from incision site, or other concerns.

## 2017-11-28 NOTE — Op Note (Signed)
SURGICAL OPERATIVE REPORT   DATE OF PROCEDURE: 11/28/2017  ATTENDING Surgeon(s): Ancil Linseyavis, Lacora Folmer Evan, MD  ANESTHESIA: GETA  PRE-OPERATIVE DIAGNOSIS: Symptomatic Cholelithiasis (K80.20)  POST-OPERATIVE DIAGNOSIS: Symptomatic Cholelithiasis (K80.20)  PROCEDURE(S): (cpt's: 47562) 1.) Laparoscopic Cholecystectomy  INTRAOPERATIVE FINDINGS: Moderate pericholecystic inflammation, particularly at gallbladder infundibulum  INTRAOPERATIVE FLUIDS: 600 mL crystalloid   ESTIMATED BLOOD LOSS: Minimal (<30 mL)   URINE OUTPUT: No foley  SPECIMENS: Gallbladder  IMPLANTS: None  DRAINS: None   COMPLICATIONS: None apparent   CONDITION AT COMPLETION: Hemodynamically stable and extubated  DISPOSITION: PACU   INDICATION(S) FOR PROCEDURE:  Patient is a 42 y.o. male who recently presented with severe intermittent post-prandial RUQ > epigastric abdominal pain after eating fatty foods in particular. Ultrasound suggested cholelithiasis without cholecystitis. All risks, benefits, and alternatives to above elective procedures were discussed with the patient, who elected to proceed, and informed consent was accordingly obtained at that time.   DETAILS OF PROCEDURE:  Patient was brought to the operating suite and appropriately identified. General anesthesia was administered along with peri-operative prophylactic IV antibiotics, and endotracheal intubation was performed by anesthesiologist, along with NG/OG tube for gastric decompression. In supine position, operative site was prepped and draped in usual sterile fashion, and following a brief time out, initial 5 mm incision was made in a natural skin crease just above the umbilicus. Fascia was then elevated, and a Verress needle was inserted and its proper position confirmed using aspiration and saline meniscus test.  Upon insufflation of the abdominal cavity with carbon dioxide to a well-tolerated pressure of 12-15 mmHg, 5 mm peri-umbilical port followed by  laparoscope were inserted and used to inspect the abdominal cavity and its contents with no injuries from insertion of the first trochar noted. Three additional trocars were inserted, one at the epigastric position (10 mm) and two along the Right costal margin (5 mm). The table was then placed in reverse Trendelenburg position with the Right side up. Filmy adhesions between the gallbladder and omentum/duodenum/transverse colon were lysed using combined blunt dissection and selective electrocautery. The apex/dome of the gallbladder was grasped with an atraumatic grasper passed through the lateral port and retracted apically over the liver. The infundibulum was also grasped and retracted, exposing Calot's triangle. The peritoneum overlying the gallbladder infundibulum was incised and dissected free of surrounding peritoneal attachments, revealing the cystic duct and cystic artery, which were clipped twice on the patient side and once on the gallbladder specimen side close to the gallbladder. The gallbladder was then dissected from its peritoneal attachments to the liver using electrocautery, and the gallbladder was placed into a laparoscopic specimen bag and removed from the abdominal cavity via the epigastric port site. Hemostasis and secure placement of clips were confirmed, and intra-peritoneal cavity was inspected with no additional findings. PMI laparoscopic fascial closure device was then used to re-approximate fascia at the 10 mm epigastric port site.  All ports were then removed under direct visualization, and abdominal cavity was desuflated. All port sites were irrigated/cleaned, additional local anesthetic was injected at each incision, 3-0 Vicryl was used to re-approximate dermis at 10 mm port site(s), and subcuticular 4-0 Monocryl suture was used to re-approximate skin. Skin was then cleaned, dried, and sterile skin glue was applied. Patient was then safely able to be awakened, extubated, and transferred  to PACU for post-operative monitoring and care.   I was present for all aspects of procedure, and there were no intra-operative complications apparent.

## 2017-11-28 NOTE — Progress Notes (Signed)
Pt drained bladder for 750  Of yellow urine   Slow drainage of urine for bladder tone  Control

## 2017-11-28 NOTE — Anesthesia Post-op Follow-up Note (Signed)
Anesthesia QCDR form completed.        

## 2017-11-28 NOTE — Progress Notes (Signed)
Pt appears to be doing better and ready to go home

## 2017-11-28 NOTE — Progress Notes (Signed)
Dr Noralyn Pickcarroll in to see pt versed 1mg  given   Pt continues to hyperventilate

## 2017-11-28 NOTE — Progress Notes (Signed)
Pt continues to have pain  Hyperventilating most of time

## 2017-11-28 NOTE — Transfer of Care (Signed)
Immediate Anesthesia Transfer of Care Note  Patient: Zachary Khan  Procedure(s) Performed: LAPAROSCOPIC CHOLECYSTECTOMY (N/A Abdomen)  Patient Location: PACU  Anesthesia Type:General  Level of Consciousness: sedated  Airway & Oxygen Therapy: Patient Spontanous Breathing and Patient connected to face mask oxygen  Post-op Assessment: Report given to RN and Post -op Vital signs reviewed and stable  Post vital signs: Reviewed and stable  Last Vitals:  Vitals:   11/28/17 1012 11/28/17 1229  BP: (!) 149/98 (!) 140/97  Pulse: 70 72  Resp: 12 17  Temp: 36.7 C 36.4 C  SpO2: 98% 100%    Last Pain:  Vitals:   11/28/17 1229  TempSrc:   PainSc: Asleep         Complications: No apparent anesthesia complications

## 2017-11-29 ENCOUNTER — Encounter: Payer: Self-pay | Admitting: Surgery

## 2017-11-30 LAB — SURGICAL PATHOLOGY

## 2017-12-04 ENCOUNTER — Encounter: Payer: Self-pay | Admitting: Surgery

## 2017-12-04 ENCOUNTER — Other Ambulatory Visit: Payer: Self-pay | Admitting: Surgery

## 2017-12-04 ENCOUNTER — Ambulatory Visit (INDEPENDENT_AMBULATORY_CARE_PROVIDER_SITE_OTHER): Payer: 59 | Admitting: Surgery

## 2017-12-04 VITALS — BP 142/88 | HR 73 | Temp 98.2°F | Ht 69.0 in | Wt 195.0 lb

## 2017-12-04 DIAGNOSIS — Z09 Encounter for follow-up examination after completed treatment for conditions other than malignant neoplasm: Secondary | ICD-10-CM

## 2017-12-04 MED ORDER — GABAPENTIN 300 MG PO CAPS: 300.0000 mg | ORAL_CAPSULE | Freq: Three times a day (TID) | ORAL | 0 refills | Status: DC

## 2017-12-04 MED ORDER — OXYCODONE HCL 5 MG PO TABS: 5.0000 mg | ORAL_TABLET | ORAL | 0 refills | Status: DC | PRN

## 2017-12-04 MED ORDER — IBUPROFEN 600 MG PO TABS: 600.0000 mg | ORAL_TABLET | Freq: Three times a day (TID) | ORAL | 0 refills | Status: DC | PRN

## 2017-12-04 NOTE — Telephone Encounter (Signed)
When calling patient to inform him about his paperwork, patient stated he ran out of his pain medication on Saturday and is asking if he could have a refill until he comes in on Friday of this week, patient said his pain level is at a seven right now.

## 2017-12-04 NOTE — Progress Notes (Signed)
12/04/2017  HPI: Patient is s/p laparoscopic cholecystectomy with Dr. Earlene Plateravis on 2/5.  He presents with significant pain at the epigastric incision.  Is able to eat well and denies having any current nausea with oral intake, but does have some nausea with the pain.  Is taking oxycodone which helps temporarily only.  Having bowel function.  Vital signs: BP (!) 142/88   Pulse 73   Temp 98.2 F (36.8 C) (Oral)   Ht 5\' 9"  (1.753 m)   Wt 88.5 kg (195 lb)   BMI 28.80 kg/m    Physical Exam: Constitutional: No acute distress Abdomen:  Soft, nondistended, with tenderness to palpation focally at the epigastric incision site.  There is no hernia palpable.  Incisions are healing well and are clean, dry, intact without evidence of infection.  Assessment/Plan: 42 yo male s/p laparoscopic cholecystectomy  --discussed with patient that likely his pain is due to suture closing the fascia of the epigastric incision, likely pinching fascia or nerve.  This will improve with time.  In the meantime, will renew prescription for oxycodone and give new prescription for gabapentin and high dose ibuprofen.  --patient will follow with Dr Earlene Plateravis at the end of the week. --discussed with patient to call if any fevers, chills, worsening pain, more in the right upper quadrant, or other concerns.   Howie IllJose Luis Loren Vicens, MD [facility]

## 2017-12-04 NOTE — Telephone Encounter (Signed)
Patient returned call at this time. I asked if he was completely out of pain medication. He stated that he was. I asked if he had tried tylenol and/or ibuprofen and he stated that he has and that it is not helping with the pain. He denies fever/chills, vomiting some nausea, and having some diarrhea. He states that he has been to the restroom 4 times day already. I verbalized to him that he would have to seen in order to get a refill on pain medication he verbalized understanding and accepted the appointment offered to him today.

## 2017-12-04 NOTE — Telephone Encounter (Signed)
Returned call to patient at this time. I left a message for patient to call back.

## 2017-12-04 NOTE — Patient Instructions (Addendum)
We will see you back as listed below.  [facility] as well as given you a refill on pain medication.  If you have any questions or concerns prior to being seen please give our office a call.

## 2017-12-08 ENCOUNTER — Ambulatory Visit (INDEPENDENT_AMBULATORY_CARE_PROVIDER_SITE_OTHER): Payer: 59 | Admitting: Surgery

## 2017-12-08 ENCOUNTER — Encounter: Payer: Self-pay | Admitting: Surgery

## 2017-12-08 VITALS — BP 150/97 | HR 74 | Temp 98.0°F | Ht 69.0 in | Wt 194.2 lb

## 2017-12-08 DIAGNOSIS — K8000 Calculus of gallbladder with acute cholecystitis without obstruction: Secondary | ICD-10-CM

## 2017-12-08 HISTORY — DX: Calculus of gallbladder with acute cholecystitis without obstruction: K80.00

## 2017-12-08 NOTE — Progress Notes (Signed)
Surgical Clinic Progress/Follow-up Note   HPI:  42 y.o. Male presents to clinic for post-op follow-up evaluation s/p laparoscopic cholecystectomy for symptomatic cholelithiasis. Patient reports complete resolution of pre-operative post-prandial RUQ abdominal pain and has been tolerating regular diet with +flatus and normal BM's following an initial loose BM on the day following his surgery. His PMD has also prescribed for him gabapentin, which has also helped to further provide him relief, and he denies N/V, fever/chills, CP, or SOB.  Review of Systems:  Constitutional: denies any other weight loss, fever, chills, or sweats  Eyes: denies any other vision changes, history of eye injury  ENT: denies sore throat, hearing problems  Respiratory: denies shortness of breath, wheezing  Cardiovascular: denies chest pain, palpitations  Gastrointestinal: abdominal pain, N/V, and bowel function as per HPI Musculoskeletal: denies any other joint pains or cramps  Skin: Denies any other rashes or skin discolorations  Neurological: denies any other headache, dizziness, weakness  Psychiatric: denies any other depression, anxiety  All other review of systems: otherwise negative   Vital Signs:  BP (!) 150/97   Pulse 74   Temp 98 F (36.7 C) (Oral)   Ht 5\' 9"  (1.753 m)   Wt 194 lb 3.2 oz (88.1 kg)   BMI 28.68 kg/m    Physical Exam:  Constitutional:  -- Normal body habitus  -- Awake, alert, and oriented x3  Eyes:  -- Pupils equally round and reactive to light  -- No scleral icterus  Ear, nose, throat:  -- No jugular venous distension  -- No nasal drainage, bleeding Pulmonary:  -- No crackles -- Equal breath sounds bilaterally -- Breathing non-labored at rest Cardiovascular:  -- S1, S2 present  -- No pericardial rubs  Gastrointestinal:  -- Soft, nontender, non-distended, no guarding/rebound -- Incisions well-approximated without surrounding erythema or drainage -- No abdominal masses  appreciated, pulsatile or otherwise  Musculoskeletal / Integumentary:  -- Wounds or skin discoloration: None appreciated except post-surgical wounds as described above (GI) -- Extremities: B/L UE and LE FROM, hands and feet warm, no edema  Neurologic:  -- Motor function: intact and symmetric  -- Sensation: intact and symmetric   Assessment:  42 y.o. yo Male with a problem list including...  Patient Active Problem List   Diagnosis Date Noted  . Essential hypertension 11/17/2017  . Pre-operative clearance 11/17/2017  . Gall stones 11/13/2017  . Migraines 06/01/2016  . Chronic back pain 06/01/2016  . Chronic neck pain 06/01/2016    presents to clinic for post-op follow-up evaluation, doing well s/p laparoscopic cholecystectomy for symptomatic cholelithiasis.  Plan:              - advance diet as tolerated              - okay to submerge incisions under water (baths, swimming) prn             - gradually resume all activities without restrictions over next 2 weeks             - apply sunblock particularly to incisions with sun exposure to reduce pigmentation of scars             - return to clinic as needed, instructed to call office if any questions or concerns   All of the above recommendations were discussed with the patient and patient's family, and all of patient's and family's questions were answered to his expressed satisfaction.  -- Scherrie GerlachJason E. Earlene Plateravis, MD, RPVI : [facility]  General Surgery - Partnering for exceptional care. Office: [phone]

## 2017-12-08 NOTE — Patient Instructions (Signed)

## 2017-12-20 DIAGNOSIS — I208 Other forms of angina pectoris: Secondary | ICD-10-CM | POA: Diagnosis not present

## 2017-12-20 DIAGNOSIS — R0602 Shortness of breath: Secondary | ICD-10-CM | POA: Diagnosis not present

## 2018-05-30 ENCOUNTER — Encounter: Payer: Self-pay | Admitting: Family Medicine

## 2018-05-30 ENCOUNTER — Ambulatory Visit (INDEPENDENT_AMBULATORY_CARE_PROVIDER_SITE_OTHER): Payer: 59 | Admitting: Family Medicine

## 2018-05-30 ENCOUNTER — Telehealth: Payer: Self-pay

## 2018-05-30 VITALS — BP 132/86 | HR 72 | Temp 98.2°F | Ht 69.0 in | Wt 189.0 lb

## 2018-05-30 DIAGNOSIS — Z8042 Family history of malignant neoplasm of prostate: Secondary | ICD-10-CM

## 2018-05-30 DIAGNOSIS — Z0000 Encounter for general adult medical examination without abnormal findings: Secondary | ICD-10-CM

## 2018-05-30 DIAGNOSIS — R070 Pain in throat: Secondary | ICD-10-CM | POA: Insufficient documentation

## 2018-05-30 MED ORDER — AMOXICILLIN 875 MG PO TABS: 875.0000 mg | ORAL_TABLET | Freq: Two times a day (BID) | ORAL | 0 refills | Status: DC

## 2018-05-30 NOTE — Assessment & Plan Note (Signed)
Anticipate irritated throat with food on Sunday, residual irritation. rec alternating tylenol and advil 400mg  TID AC, further supportive care as per instructions. Given possible throat wound, will also Rx amox 1 wk course preventatively. Update if not improving with treatment.

## 2018-05-30 NOTE — Telephone Encounter (Signed)
Noted, lab orders placed. 

## 2018-05-30 NOTE — Patient Instructions (Signed)
You may have had scratch in throat that is just not healing well. Treat with antibiotic. Take advil 2 tablets with meals, with tylenol 500mg  in between. Continue throat spray, honey, salt water gargles or popsicles - whatever feels better for the throat.  Let us know if not improved with above

## 2018-05-30 NOTE — Progress Notes (Signed)
BP 132/86 (BP Location: Left Arm, Patient Position: Sitting, Cuff Size: Normal)   Pulse 72   Temp 98.2 F (36.8 C) (Oral)   Ht 5\' 9"  (1.753 m)   Wt 189 lb (85.7 kg)   SpO2 95%   BMI 27.91 kg/m    CC: ST Subjective:    Patient ID: Zachary Khan, male    DOB: 10/23/1976, 42 y.o.   MRN: [id number]  HPI: Zachary Khan is a 42 y.o. male presenting on 05/30/2018 for Sore Throat (C/o left tonsil irritation. Started 05/28/18. ) and Ear Pain (C/o left ear pain. Started 05/29/18. )   3d h/o L throat pain that progressed to L earache. No hearing changes, ringing in ears, dizziness.  Throat pain may have started after he swallowed hard piece of fried chicken on Sunday - feels he may have cut throat.   Treating with tylenol, throat spray. No fevers/chills, congh, congestion, rhinorrhea, facial pain or pressure headache.  No sick contacts at home. Smoker - 1/2 ppd - rolls his own cigarettes.  Exertional asthma.   Relevant past medical, surgical, family and social history reviewed and updated as indicated. Interim medical history since our last visit reviewed. Allergies and medications reviewed and updated. Outpatient Medications Prior to Visit  Medication Sig Dispense Refill  . SUMAtriptan (IMITREX) 100 MG tablet Take 1 tablet at migraine onset. May repeat in 2 hours if no resolve. Do not exceed 2 tablets in 24 hours. (Patient taking differently: Take 100 mg by mouth every 2 (two) hours as needed for migraine. TMay repeat in 2 hours if no resolve. Do not exceed 2 tablets in 24 hours.) 10 tablet 0  . gabapentin (NEURONTIN) 300 MG capsule Take 1 capsule (300 mg total) by mouth 3 (three) times daily. 30 capsule 0  . ibuprofen (ADVIL,MOTRIN) 600 MG tablet Take 1 tablet (600 mg total) by mouth every 8 (eight) hours as needed. 30 tablet 0  . ondansetron (ZOFRAN ODT) 4 MG disintegrating tablet Take 1 tablet (4 mg total) by mouth every 8 (eight) hours as needed for nausea or vomiting. 20 tablet 0  .  oxyCODONE (ROXICODONE) 5 MG immediate release tablet Take 1-2 tablets (5-10 mg total) by mouth every 4 (four) hours as needed for severe pain. 10 tablet 0   No facility-administered medications prior to visit.      Per HPI unless specifically indicated in ROS section below Review of Systems     Objective:    BP 132/86 (BP Location: Left Arm, Patient Position: Sitting, Cuff Size: Normal)   Pulse 72   Temp 98.2 F (36.8 C) (Oral)   Ht 5\' 9"  (1.753 m)   Wt 189 lb (85.7 kg)   SpO2 95%   BMI 27.91 kg/m   Wt Readings from Last 3 Encounters:  05/30/18 189 lb (85.7 kg)  12/08/17 194 lb 3.2 oz (88.1 kg)  12/04/17 195 lb (88.5 kg)    Physical Exam  Constitutional: He appears well-developed and well-nourished. No distress.  HENT:  Head: Normocephalic and atraumatic.  Right Ear: Hearing, tympanic membrane, external ear and ear canal normal.  Left Ear: Hearing, tympanic membrane, external ear and ear canal normal.  Nose: Nose normal. No mucosal edema or rhinorrhea. Right sinus exhibits no maxillary sinus tenderness and no frontal sinus tenderness. Left sinus exhibits no maxillary sinus tenderness and no frontal sinus tenderness.  Mouth/Throat: Uvula is midline, oropharynx is clear and moist and mucous membranes are normal. No oropharyngeal exudate, posterior oropharyngeal edema,  posterior oropharyngeal erythema or tonsillar abscesses.  Slight swelling of L tonsil without erythema or exudate  Eyes: Pupils are equal, round, and reactive to light. Conjunctivae and EOM are normal. No scleral icterus.  Neck: Normal range of motion. Neck supple.    Tender to palpation L throat posterior to carotid artery, without obvious lymphadenopathy  Cardiovascular: Normal rate, regular rhythm, normal heart sounds and intact distal pulses.  No murmur heard. Pulmonary/Chest: Effort normal and breath sounds normal. No respiratory distress. He has no wheezes. He has no rales.  Lymphadenopathy:    He has no  cervical adenopathy.  Skin: Skin is warm and dry. No rash noted.  Nursing note and vitals reviewed.     Assessment & Plan:   Problem List Items Addressed This Visit    Throat pain in adult - Primary    Anticipate irritated throat with food on Sunday, residual irritation. rec alternating tylenol and advil 400mg  TID AC, further supportive care as per instructions. Given possible throat wound, will also Rx amox 1 wk course preventatively. Update if not improving with treatment.           Meds ordered this encounter  Medications  . amoxicillin (AMOXIL) 875 MG tablet    Sig: Take 1 tablet (875 mg total) by mouth 2 (two) times daily.    Dispense:  14 tablet    Refill:  0   No orders of the defined types were placed in this encounter.   Follow up plan: No follow-ups on file.  Eustaquio Boyden, MD

## 2018-05-30 NOTE — Telephone Encounter (Signed)
Spoke with patient, he is coming in October for physical and labs prior and mentions that his maternal grandfather died of prostate cancer and he was in his 2970s he thinks when he developed this, not sure 100%. Patient wanted to see if we can have PSA level added to his lab work. Discussed with Vernona RiegerKatherine Clark, NP and sending this note to remind for the future. Patient knows this should be added and if there are any issues patient will be notified. Thank Neta Mendsyou-Courtez Twaddle V Janeah Kovacich, RMA

## 2018-08-09 ENCOUNTER — Other Ambulatory Visit (INDEPENDENT_AMBULATORY_CARE_PROVIDER_SITE_OTHER): Payer: 59

## 2018-08-09 DIAGNOSIS — Z8042 Family history of malignant neoplasm of prostate: Secondary | ICD-10-CM | POA: Diagnosis not present

## 2018-08-09 DIAGNOSIS — Z0000 Encounter for general adult medical examination without abnormal findings: Secondary | ICD-10-CM | POA: Diagnosis not present

## 2018-08-09 LAB — COMPREHENSIVE METABOLIC PANEL
ALBUMIN: 4.4 g/dL (ref 3.5–5.2)
ALK PHOS: 57 U/L (ref 39–117)
ALT: 20 U/L (ref 0–53)
AST: 14 U/L (ref 0–37)
BILIRUBIN TOTAL: 1.5 mg/dL — AB (ref 0.2–1.2)
BUN: 11 mg/dL (ref 6–23)
CO2: 29 mEq/L (ref 19–32)
Calcium: 9.7 mg/dL (ref 8.4–10.5)
Chloride: 104 mEq/L (ref 96–112)
Creatinine, Ser: 1.16 mg/dL (ref 0.40–1.50)
GFR: 73.61 mL/min (ref >60.00)
GLUCOSE: 92 mg/dL (ref 70–99)
Potassium: 4 mEq/L (ref 3.5–5.1)
SODIUM: 142 meq/L (ref 135–145)
TOTAL PROTEIN: 6.7 g/dL (ref 6.0–8.3)

## 2018-08-09 LAB — LIPID PANEL
Cholesterol: 171 mg/dL (ref 0–200)
HDL: 50.7 mg/dL (ref >39.00)
LDL Cholesterol: 89 mg/dL (ref 0–99)
NONHDL: 120.72
Total CHOL/HDL Ratio: 3
Triglycerides: 158 mg/dL — ABNORMAL HIGH (ref 0.0–149.0)
VLDL: 31.6 mg/dL (ref 0.0–40.0)

## 2018-08-09 LAB — PSA: PSA: 0.96 ng/mL (ref 0.10–4.00)

## 2018-08-14 ENCOUNTER — Ambulatory Visit (INDEPENDENT_AMBULATORY_CARE_PROVIDER_SITE_OTHER): Payer: 59 | Admitting: Primary Care

## 2018-08-14 ENCOUNTER — Encounter: Payer: Self-pay | Admitting: Primary Care

## 2018-08-14 VITALS — BP 152/90 | HR 69 | Temp 98.2°F | Ht 66.5 in | Wt 188.8 lb

## 2018-08-14 DIAGNOSIS — Z0000 Encounter for general adult medical examination without abnormal findings: Secondary | ICD-10-CM | POA: Diagnosis not present

## 2018-08-14 DIAGNOSIS — G43709 Chronic migraine without aura, not intractable, without status migrainosus: Secondary | ICD-10-CM

## 2018-08-14 DIAGNOSIS — Z9049 Acquired absence of other specified parts of digestive tract: Secondary | ICD-10-CM

## 2018-08-14 DIAGNOSIS — I10 Essential (primary) hypertension: Secondary | ICD-10-CM

## 2018-08-14 DIAGNOSIS — G43701 Chronic migraine without aura, not intractable, with status migrainosus: Secondary | ICD-10-CM

## 2018-08-14 DIAGNOSIS — G4700 Insomnia, unspecified: Secondary | ICD-10-CM

## 2018-08-14 DIAGNOSIS — Z23 Encounter for immunization: Secondary | ICD-10-CM

## 2018-08-14 DIAGNOSIS — IMO0002 Reserved for concepts with insufficient information to code with codable children: Secondary | ICD-10-CM

## 2018-08-14 MED ORDER — TRAZODONE HCL 50 MG PO TABS: 25.0000 mg | ORAL_TABLET | Freq: Every evening | ORAL | 0 refills | Status: DC | PRN

## 2018-08-14 MED ORDER — LISINOPRIL 20 MG PO TABS: ORAL_TABLET | ORAL | 0 refills | Status: DC

## 2018-08-14 MED ORDER — SUMATRIPTAN SUCCINATE 100 MG PO TABS: ORAL_TABLET | ORAL | 0 refills | Status: DC

## 2018-08-14 NOTE — Patient Instructions (Signed)
Start lisinopril 20 mg tablets for high blood pressure. Take 1 tablet by mouth once daily.  Start trazodone 50 mg tablets for sleep. Take 1/2 to 1 tablet by mouth at bedtime for sleep. Do not take Melatonin with Trazodone.  Start exercising. You should be getting 150 minutes of moderate intensity exercise weekly.  It's important to improve your diet by reducing consumption of fast food, fried food, processed snack foods, sugary drinks. Increase consumption of fresh vegetables and fruits, whole grains, water.  Ensure you are drinking 64 ounces of water daily.  Schedule a follow up visit in 2-3 weeks for blood pressure check.  It was a pleasure to see you today!   Preventive Care 40-64 Years, Male Preventive care refers to lifestyle choices and visits with your health care provider that can promote health and wellness. What does preventive care include?  A yearly physical exam. This is also called an annual well check.  Dental exams once or twice a year.  Routine eye exams. Ask your health care provider how often you should have your eyes checked.  Personal lifestyle choices, including: ? Daily care of your teeth and gums. ? Regular physical activity. ? Eating a healthy diet. ? Avoiding tobacco and drug use. ? Limiting alcohol use. ? Practicing safe sex. ? Taking low-dose aspirin every day starting at age 20. What happens during an annual well check? The services and screenings done by your health care provider during your annual well check will depend on your age, overall health, lifestyle risk factors, and family history of disease. Counseling Your health care provider may ask you questions about your:  Alcohol use.  Tobacco use.  Drug use.  Emotional well-being.  Home and relationship well-being.  Sexual activity.  Eating habits.  Work and work Statistician.  Screening You may have the following tests or measurements:  Height, weight, and BMI.  Blood  pressure.  Lipid and cholesterol levels. These may be checked every 5 years, or more frequently if you are over 73 years old.  Skin check.  Lung cancer screening. You may have this screening every year starting at age 59 if you have a 30-pack-year history of smoking and currently smoke or have quit within the past 15 years.  Fecal occult blood test (FOBT) of the stool. You may have this test every year starting at age 6.  Flexible sigmoidoscopy or colonoscopy. You may have a sigmoidoscopy every 5 years or a colonoscopy every 10 years starting at age 85.  Prostate cancer screening. Recommendations will vary depending on your family history and other risks.  Hepatitis C blood test.  Hepatitis B blood test.  Sexually transmitted disease (STD) testing.  Diabetes screening. This is done by checking your blood sugar (glucose) after you have not eaten for a while (fasting). You may have this done every 1-3 years.  Discuss your test results, treatment options, and if necessary, the need for more tests with your health care provider. Vaccines Your health care provider may recommend certain vaccines, such as:  Influenza vaccine. This is recommended every year.  Tetanus, diphtheria, and acellular pertussis (Tdap, Td) vaccine. You may need a Td booster every 10 years.  Varicella vaccine. You may need this if you have not been vaccinated.  Zoster vaccine. You may need this after age 2.  Measles, mumps, and rubella (MMR) vaccine. You may need at least one dose of MMR if you were born in 1957 or later. You may also need a second dose.  Pneumococcal 13-valent conjugate (PCV13) vaccine. You may need this if you have certain conditions and have not been vaccinated.  Pneumococcal polysaccharide (PPSV23) vaccine. You may need one or two doses if you smoke cigarettes or if you have certain conditions.  Meningococcal vaccine. You may need this if you have certain conditions.  Hepatitis A  vaccine. You may need this if you have certain conditions or if you travel or work in places where you may be exposed to hepatitis A.  Hepatitis B vaccine. You may need this if you have certain conditions or if you travel or work in places where you may be exposed to hepatitis B.  Haemophilus influenzae type b (Hib) vaccine. You may need this if you have certain risk factors.  Talk to your health care provider about which screenings and vaccines you need and how often you need them. This information is not intended to replace advice given to you by your health care provider. Make sure you discuss any questions you have with your health care provider. Document Released: 11/06/2015 Document Revised: 06/29/2016 Document Reviewed: 08/11/2015 Elsevier Interactive Patient Education  [employer].

## 2018-08-14 NOTE — Assessment & Plan Note (Signed)
More frequent migraines recently, suspect hypertension could be contributing. Refilled Imitrex, consider daily preventative treatment if needed. Will treat hypertension with hopes of reducing/elimiating migraines.

## 2018-08-14 NOTE — Assessment & Plan Note (Signed)
Chronic for years, difficulty falling asleep. Failed OTC treatment, amitriptyline causing next day drowsiness. Will trial low dose Trazodone HS, he will update.

## 2018-08-14 NOTE — Assessment & Plan Note (Signed)
Above goal in the office today, also on prior visits. Discussed long term effects of uncontrolled hypertension, he agrees to treatment.   Rx for lisinopril 20 mg sent to pharmacy. Will see him back in the office in 2-3 weeks for BP check and BMP.

## 2018-08-14 NOTE — Assessment & Plan Note (Signed)
Td due, provided today, influenza vaccination provided today. Discussed to start regular exercise, improve diet. Exam unremarkable. Labs reviewed.  Follow up in 1 year for CPE.

## 2018-08-14 NOTE — Assessment & Plan Note (Signed)
Abdominal symptoms have resolved since cholecystectomy.

## 2018-08-14 NOTE — Progress Notes (Signed)
Subjective:    Patient ID: Zachary Khan, male    DOB: 01-22-1976, 42 y.o.   MRN: [id number]  HPI  Mr. Zachary Khan is a 42 year old male who presents today for complete physical.  Immunizations: -Tetanus: Over 10 years ago -Influenza: Due today   Diet: He endorses a fair diet Breakfast: Skips during work week, Agricultural engineer during weekends Lunch: Left overs  Dinner: Set designer, pasta, vegetables Snacks: Chips, occasionally candy Desserts: 3 days weekly  Beverages: Water, coffee, soda  Exercise: He is not exercising  Eye exam: Completed in 2019 Dental exam: No recent exam  BP Readings from Last 3 Encounters:  08/14/18 (!) 152/90  05/30/18 132/86  12/08/17 (!) 150/97      Review of Systems  Constitutional: Negative for unexpected weight change.  HENT: Negative for rhinorrhea.   Respiratory: Negative for cough and shortness of breath.   Cardiovascular: Negative for chest pain.  Gastrointestinal: Negative for constipation and diarrhea.  Genitourinary: Negative for difficulty urinating.  Musculoskeletal: Negative for arthralgias.  Skin: Negative for rash.  Allergic/Immunologic: Negative for environmental allergies.  Neurological: Positive for headaches. Negative for dizziness and numbness.       Migraines  Psychiatric/Behavioral: Positive for sleep disturbance.       Difficulty falling asleep for years, does not wake during the night once asleep. Will lay awake for several hours. Does not watch TV or play on computer prior to bed. now taking melatonin 20 mg without improvement. Amitriptyline in the past was helpful but caused next day drowsiness.         Past Medical History:  Diagnosis Date  . Asthma    AS A CHILD-NO INHALERS  . Cervical spine degeneration   . Chicken pox   . Cholelithiasis and acute cholecystitis without obstruction 12/08/2017  . Chronic back pain   . Frequent headaches   . GERD (gastroesophageal reflux disease)   . Migraine      Social History    Socioeconomic History  . Marital status: Single    Spouse name: Not on file  . Number of children: Not on file  . Years of education: Not on file  . Highest education level: Not on file  Occupational History  . Not on file  Social Needs  . Financial resource strain: Not on file  . Food insecurity:    Worry: Not on file    Inability: Not on file  . Transportation needs:    Medical: Not on file    Non-medical: Not on file  Tobacco Use  . Smoking status: Current Every Day Smoker    Packs/day: 0.25    Years: 20.00    Pack years: 5.00    Types: Cigarettes  . Smokeless tobacco: Never Used  Substance and Sexual Activity  . Alcohol use: Yes    Comment: 2 BEERS ON WEEKEND  . Drug use: Yes    Types: Marijuana    Comment: OCC-NOT EVERYDAY  . Sexual activity: Not on file  Lifestyle  . Physical activity:    Days per week: Not on file    Minutes per session: Not on file  . Stress: Not on file  Relationships  . Social connections:    Talks on phone: Not on file    Gets together: Not on file    Attends religious service: Not on file    Active member of club or organization: Not on file    Attends meetings of clubs or organizations: Not on file  Relationship status: Not on file  . Intimate partner violence:    Fear of current or ex partner: Not on file    Emotionally abused: Not on file    Physically abused: Not on file    Forced sexual activity: Not on file  Other Topics Concern  . Not on file  Social History Narrative   Single.   4 children.   Works as a Retail banker.   Highest level of education: GED.    Past Surgical History:  Procedure Laterality Date  . CHOLECYSTECTOMY N/A 11/28/2017   Procedure: LAPAROSCOPIC CHOLECYSTECTOMY;  Surgeon: Ancil Linsey, MD;  Location: [facility] ORS;  Service: General;  Laterality: N/A;  . NO PAST SURGERIES      Family History  Problem Relation Age of Onset  . Arthritis Mother   . Heart disease Mother   . Hypertension  Mother   . Alcohol abuse Father   . ALS Father   . Rheum arthritis Maternal Grandmother     Allergies  Allergen Reactions  . Hydrocodone-Acetaminophen Hives  . Sulfa Antibiotics Hives  . Sulfasalazine Hives  . Vicodin [Hydrocodone-Acetaminophen] Hives    Current Outpatient Medications on File Prior to Visit  Medication Sig Dispense Refill  . SUMAtriptan (IMITREX) 100 MG tablet Take 1 tablet at migraine onset. May repeat in 2 hours if no resolve. Do not exceed 2 tablets in 24 hours. (Patient taking differently: Take 100 mg by mouth every 2 (two) hours as needed for migraine. TMay repeat in 2 hours if no resolve. Do not exceed 2 tablets in 24 hours.) 10 tablet 0   No current facility-administered medications on file prior to visit.     BP (!) 152/90 (BP Location: Right Arm, Patient Position: Sitting, Cuff Size: Normal)   Pulse 69   Temp 98.2 F (36.8 C) (Oral)   Ht 5' 6.5" (1.689 m)   Wt 188 lb 12.8 oz (85.6 kg)   SpO2 98%   BMI 30.02 kg/m    Objective:   Physical Exam  Constitutional: He is oriented to person, place, and time. He appears well-nourished.  HENT:  Mouth/Throat: No oropharyngeal exudate.  Eyes: Pupils are equal, round, and reactive to light. EOM are normal.  Neck: Neck supple. No thyromegaly present.  Cardiovascular: Normal rate and regular rhythm.  Respiratory: Effort normal and breath sounds normal.  GI: Soft. Bowel sounds are normal. There is no tenderness.  Musculoskeletal: Normal range of motion.  Neurological: He is alert and oriented to person, place, and time.  Skin: Skin is warm and dry.  Psychiatric: He has a normal mood and affect.           Assessment & Plan:

## 2018-08-29 ENCOUNTER — Other Ambulatory Visit: Payer: Self-pay

## 2018-08-29 ENCOUNTER — Emergency Department
Admission: EM | Admit: 2018-08-29 | Discharge: 2018-08-29 | Disposition: A | Discharge status: Home / Self Care | Payer: 59 | Attending: Emergency Medicine | Admitting: Emergency Medicine

## 2018-08-29 DIAGNOSIS — R531 Weakness: Secondary | ICD-10-CM | POA: Diagnosis not present

## 2018-08-29 DIAGNOSIS — M7918 Myalgia, other site: Secondary | ICD-10-CM | POA: Diagnosis present

## 2018-08-29 DIAGNOSIS — J111 Influenza due to unidentified influenza virus with other respiratory manifestations: Secondary | ICD-10-CM | POA: Diagnosis not present

## 2018-08-29 DIAGNOSIS — M6281 Muscle weakness (generalized): Secondary | ICD-10-CM | POA: Diagnosis not present

## 2018-08-29 DIAGNOSIS — I10 Essential (primary) hypertension: Secondary | ICD-10-CM | POA: Diagnosis not present

## 2018-08-29 DIAGNOSIS — F17210 Nicotine dependence, cigarettes, uncomplicated: Secondary | ICD-10-CM | POA: Insufficient documentation

## 2018-08-29 DIAGNOSIS — R69 Illness, unspecified: Secondary | ICD-10-CM

## 2018-08-29 DIAGNOSIS — Z79899 Other long term (current) drug therapy: Secondary | ICD-10-CM | POA: Diagnosis not present

## 2018-08-29 MED ORDER — SODIUM CHLORIDE 0.9 % IV BOLUS
1000.0000 mL | Freq: Once | INTRAVENOUS | Status: AC
  Administered 2018-08-29: 1000 mL via INTRAVENOUS

## 2018-08-29 MED ORDER — KETOROLAC TROMETHAMINE 30 MG/ML IJ SOLN
15.0000 mg | INTRAMUSCULAR | Status: AC
  Administered 2018-08-29: 15 mg via INTRAVENOUS
  Filled 2018-08-29: qty 1

## 2018-08-29 MED ORDER — KETOROLAC TROMETHAMINE 10 MG PO TABS: 10.0000 mg | ORAL_TABLET | Freq: Four times a day (QID) | ORAL | 0 refills | Status: DC | PRN

## 2018-08-29 MED ORDER — ONDANSETRON HCL 4 MG/2ML IJ SOLN
4.0000 mg | Freq: Once | INTRAMUSCULAR | Status: AC
  Administered 2018-08-29: 4 mg via INTRAVENOUS
  Filled 2018-08-29: qty 2

## 2018-08-29 MED ORDER — ONDANSETRON 4 MG PO TBDP: 4.0000 mg | ORAL_TABLET | Freq: Three times a day (TID) | ORAL | 0 refills | Status: DC | PRN

## 2018-08-29 MED ORDER — FAMOTIDINE IN NACL 20-0.9 MG/50ML-% IV SOLN
20.0000 mg | Freq: Once | INTRAVENOUS | Status: AC
  Administered 2018-08-29: 20 mg via INTRAVENOUS
  Filled 2018-08-29: qty 50

## 2018-08-29 MED ORDER — FAMOTIDINE 20 MG PO TABS: 20.0000 mg | ORAL_TABLET | Freq: Two times a day (BID) | ORAL | 0 refills | Status: AC

## 2018-08-29 NOTE — ED Provider Notes (Signed)
[facility] Emergency Department Provider Note  ____________________________________________  Time seen: Approximately 8:11 PM  I have reviewed the triage vital signs and the nursing notes.   HISTORY  Chief Complaint No chief complaint on file.    HPI Zachary Khan is a 42 y.o. male with a history of GERD, migraine, chronic low back pain who complains of diffuse myalgias nausea vomiting diarrhea on awakening this morning.  He has fatigue and malaise.  At bedtime last night he was feeling fine.  Positive chills and sweats, no fever.  Symptoms are constant, waxing waning, no aggravating or alleviating factors today.      Past Medical History:  Diagnosis Date  . Asthma    AS A CHILD-NO INHALERS  . Cervical spine degeneration   . Chicken pox   . Cholelithiasis and acute cholecystitis without obstruction 12/08/2017  . Chronic back pain   . Frequent headaches   . GERD (gastroesophageal reflux disease)   . Migraine      Patient Active Problem List   Diagnosis Date Noted  . Insomnia 08/14/2018  . Preventative health care 08/14/2018  . Essential hypertension 11/17/2017  . History of cholecystectomy 11/13/2017  . Migraines 06/01/2016  . Chronic back pain 06/01/2016  . Chronic neck pain 06/01/2016     Past Surgical History:  Procedure Laterality Date  . CHOLECYSTECTOMY N/A 11/28/2017   Procedure: LAPAROSCOPIC CHOLECYSTECTOMY;  Surgeon: Ancil Linsey, MD;  Location: [facility] ORS;  Service: General;  Laterality: N/A;  . NO PAST SURGERIES       Prior to Admission medications   Medication Sig Start Date End Date Taking? Authorizing Provider  famotidine (PEPCID) 20 MG tablet Take 1 tablet (20 mg total) by mouth 2 (two) times daily. 08/29/18   Sharman Cheek, MD  ketorolac (TORADOL) 10 MG tablet Take 1 tablet (10 mg total) by mouth every 6 (six) hours as needed for moderate pain. 08/29/18   Sharman Cheek, MD  lisinopril (PRINIVIL,ZESTRIL) 20 MG  tablet Take 1 tablet by mouth once daily for blood pressure. 08/14/18   Doreene Nest, NP  ondansetron (ZOFRAN ODT) 4 MG disintegrating tablet Take 1 tablet (4 mg total) by mouth every 8 (eight) hours as needed for nausea or vomiting. 08/29/18   Sharman Cheek, MD  SUMAtriptan (IMITREX) 100 MG tablet Take 1 tablet at migraine onset. May repeat in 2 hours if no resolve. Do not exceed 2 tablets in 24 hours. 08/14/18   Doreene Nest, NP  traZODone (DESYREL) 50 MG tablet Take 0.5-1 tablets (25-50 mg total) by mouth at bedtime as needed for sleep. 08/14/18   Doreene Nest, NP     Allergies Hydrocodone-acetaminophen; Sulfa antibiotics; Sulfasalazine; and Vicodin [hydrocodone-acetaminophen]   Family History  Problem Relation Age of Onset  . Arthritis Mother   . Heart disease Mother   . Hypertension Mother   . Alcohol abuse Father   . ALS Father   . Rheum arthritis Maternal Grandmother     Social History Social History   Tobacco Use  . Smoking status: Current Every Day Smoker    Packs/day: 0.25    Years: 20.00    Pack years: 5.00    Types: Cigarettes  . Smokeless tobacco: Never Used  Substance Use Topics  . Alcohol use: Yes    Comment: 2 BEERS ON WEEKEND  . Drug use: Yes    Types: Marijuana    Comment: OCC-NOT EVERYDAY    Review of Systems  Constitutional:   No  fever positive chills.  ENT:   No sore throat. No rhinorrhea. Cardiovascular:   No chest pain or syncope. Respiratory:   No dyspnea or cough. Gastrointestinal:   Positive for vomiting and diarrhea, nonbloody.  No belly pain..  Musculoskeletal:   Positive chronic back pain and diffuse myalgias All other systems reviewed and are negative except as documented above in ROS and HPI.  ____________________________________________   PHYSICAL EXAM:  VITAL SIGNS: ED Triage Vitals  Enc Vitals Group     BP 08/29/18 1919 (!) 147/101     Pulse Rate 08/29/18 1919 (!) 58     Resp 08/29/18 1919 17     Temp  08/29/18 1919 99 F (37.2 C)     Temp Source 08/29/18 1919 Oral     SpO2 08/29/18 1919 96 %     Weight 08/29/18 1920 195 lb (88.5 kg)     Height 08/29/18 1920 5\' 7"  (1.702 m)     Head Circumference --      Peak Flow --      Pain Score 08/29/18 1920 9     Pain Loc --      Pain Edu? --      Excl. in GC? --     Vital signs reviewed, nursing assessments reviewed.   Constitutional:   Alert and oriented. Non-toxic appearance. Eyes:   Conjunctivae are normal. EOMI. PERRL. ENT      Head:   Normocephalic and atraumatic.      Nose:   No congestion/rhinnorhea.       Mouth/Throat:   MMM, no pharyngeal erythema. No peritonsillar mass.       Neck:   No meningismus. Full ROM. Hematological/Lymphatic/Immunilogical:   No cervical lymphadenopathy. Cardiovascular:   RRR. Symmetric bilateral radial and DP pulses.  No murmurs. Cap refill less than 2 seconds. Respiratory:   Normal respiratory effort without tachypnea/retractions. Breath sounds are clear and equal bilaterally. No wheezes/rales/rhonchi. Gastrointestinal:   Soft and nontender. Non distended. There is no CVA tenderness.  No rebound, rigidity, or guarding. Genitourinary:   deferred Musculoskeletal:   Normal range of motion in all extremities. No joint effusions.  No lower extremity tenderness.  No edema. Neurologic:   Normal speech and language.  Motor grossly intact. No acute focal neurologic deficits are appreciated.  Skin:    Skin is warm, dry and intact. No rash noted.  No petechiae, purpura, or bullae.  ____________________________________________    LABS (pertinent positives/negatives) (all labs ordered are listed, but only abnormal results are displayed) Labs Reviewed - No data to display ____________________________________________   EKG  Interpreted by me Sinus rhythm rate of 62, normal axis and intervals.  Poor R wave progression, incomplete right bundle branch block.  Normal ST segments and T  waves.  ____________________________________________    RADIOLOGY  No results found.  ____________________________________________   PROCEDURES Procedures  ____________________________________________    CLINICAL IMPRESSION / ASSESSMENT AND PLAN / ED COURSE  Pertinent labs & imaging results that were available during my care of the patient were reviewed by me and considered in my medical decision making (see chart for details).    Patient presents with symptoms consistent with an acute gastroenteritis/influenza-like illness.  Vital signs are normal, exam is benign and reassuring.Considering the patient's symptoms, medical history, and physical examination today, I have low suspicion for cholecystitis or biliary pathology, pancreatitis, perforation or bowel obstruction, hernia, intra-abdominal abscess, AAA or dissection, volvulus or intussusception, mesenteric ischemia, or appendicitis.  Treat symptomatically with IV fluids, anti-inflammatories,  antiemetics.  Follow-up with primary care.      ____________________________________________   FINAL CLINICAL IMPRESSION(S) / ED DIAGNOSES    Final diagnoses:  Influenza-like illness     ED Discharge Orders         Ordered    ketorolac (TORADOL) 10 MG tablet  Every 6 hours PRN     08/29/18 2011    famotidine (PEPCID) 20 MG tablet  2 times daily     08/29/18 2011    ondansetron (ZOFRAN ODT) 4 MG disintegrating tablet  Every 8 hours PRN     08/29/18 2011          Portions of this note were generated with dragon dictation software. Dictation errors may occur despite best attempts at proofreading.    Sharman Cheek, MD 08/29/18 2016

## 2018-08-29 NOTE — ED Triage Notes (Signed)
Pt arrived from home via EMS with complaints of Nausea, vomiting and diarrhea. Pt states it started this morning. Pt is alert and oriented x 4. EMS stated pt was contractured when they arrived. EMS states that the original call out was for a stroke but they said his stroke screen was negative. Pt has Hx of HTN and migraines. VS per EMS BP-176/100 HR-60 O2sat-98%RA NSR on monitor CBG-102 CO2-39 Temp-100.0. EMS placed an 18 gauge in left AC. EMS gave of NaCl and states that lungs sounds are clear.

## 2018-08-31 ENCOUNTER — Encounter: Payer: Self-pay | Admitting: Emergency Medicine

## 2018-08-31 ENCOUNTER — Emergency Department
Admission: EM | Admit: 2018-08-31 | Discharge: 2018-08-31 | Disposition: A | Discharge status: Home / Self Care | Payer: 59 | Attending: Emergency Medicine | Admitting: Emergency Medicine

## 2018-08-31 ENCOUNTER — Emergency Department: Payer: 59

## 2018-08-31 DIAGNOSIS — F17210 Nicotine dependence, cigarettes, uncomplicated: Secondary | ICD-10-CM | POA: Insufficient documentation

## 2018-08-31 DIAGNOSIS — J45909 Unspecified asthma, uncomplicated: Secondary | ICD-10-CM | POA: Diagnosis not present

## 2018-08-31 DIAGNOSIS — I10 Essential (primary) hypertension: Secondary | ICD-10-CM | POA: Insufficient documentation

## 2018-08-31 DIAGNOSIS — R1110 Vomiting, unspecified: Secondary | ICD-10-CM | POA: Diagnosis not present

## 2018-08-31 DIAGNOSIS — R1031 Right lower quadrant pain: Secondary | ICD-10-CM | POA: Diagnosis not present

## 2018-08-31 DIAGNOSIS — R1030 Lower abdominal pain, unspecified: Secondary | ICD-10-CM

## 2018-08-31 DIAGNOSIS — R1033 Periumbilical pain: Secondary | ICD-10-CM | POA: Diagnosis not present

## 2018-08-31 DIAGNOSIS — R197 Diarrhea, unspecified: Secondary | ICD-10-CM

## 2018-08-31 DIAGNOSIS — R109 Unspecified abdominal pain: Secondary | ICD-10-CM | POA: Diagnosis not present

## 2018-08-31 LAB — URINALYSIS, COMPLETE (UACMP) WITH MICROSCOPIC
BILIRUBIN URINE: NEGATIVE
Bacteria, UA: NONE SEEN
GLUCOSE, UA: NEGATIVE mg/dL
KETONES UR: NEGATIVE mg/dL
LEUKOCYTES UA: NEGATIVE
NITRITE: NEGATIVE
PH: 6 (ref 5.0–8.0)
Protein, ur: NEGATIVE mg/dL
Specific Gravity, Urine: 1.005 (ref 1.005–1.030)

## 2018-08-31 LAB — COMPREHENSIVE METABOLIC PANEL
ALT: 18 U/L (ref 0–44)
ANION GAP: 7 (ref 5–15)
AST: 17 U/L (ref 15–41)
Albumin: 4.5 g/dL (ref 3.5–5.0)
Alkaline Phosphatase: 57 U/L (ref 38–126)
BUN: 7 mg/dL (ref 6–20)
CALCIUM: 9.3 mg/dL (ref 8.9–10.3)
CHLORIDE: 104 mmol/L (ref 98–111)
CO2: 29 mmol/L (ref 22–32)
CREATININE: 0.9 mg/dL (ref 0.61–1.24)
Glucose, Bld: 99 mg/dL (ref 70–99)
Potassium: 3.7 mmol/L (ref 3.5–5.1)
Sodium: 140 mmol/L (ref 135–145)
Total Bilirubin: 1.7 mg/dL — ABNORMAL HIGH (ref 0.3–1.2)
Total Protein: 7.2 g/dL (ref 6.5–8.1)

## 2018-08-31 LAB — CBC
HEMATOCRIT: 46.3 % (ref 39.0–52.0)
Hemoglobin: 16.4 g/dL (ref 13.0–17.0)
MCH: 32 pg (ref 26.0–34.0)
MCHC: 35.4 g/dL (ref 30.0–36.0)
MCV: 90.3 fL (ref 80.0–100.0)
NRBC: 0 % (ref 0.0–0.2)
PLATELETS: 266 10*3/uL (ref 150–400)
RBC: 5.13 MIL/uL (ref 4.22–5.81)
RDW: 11.9 % (ref 11.5–15.5)
WBC: 9.9 10*3/uL (ref 4.0–10.5)

## 2018-08-31 LAB — LIPASE, BLOOD: LIPASE: 45 U/L (ref 11–51)

## 2018-08-31 MED ORDER — ONDANSETRON HCL 4 MG/2ML IJ SOLN
4.0000 mg | Freq: Once | INTRAMUSCULAR | Status: AC
  Administered 2018-08-31: 4 mg via INTRAVENOUS
  Filled 2018-08-31: qty 2

## 2018-08-31 MED ORDER — DICYCLOMINE HCL 20 MG PO TABS: 20.0000 mg | ORAL_TABLET | Freq: Three times a day (TID) | ORAL | 0 refills | Status: DC | PRN

## 2018-08-31 MED ORDER — MORPHINE SULFATE (PF) 4 MG/ML IV SOLN
4.0000 mg | Freq: Once | INTRAVENOUS | Status: AC
  Administered 2018-08-31: 4 mg via INTRAVENOUS
  Filled 2018-08-31: qty 1

## 2018-08-31 MED ORDER — IOPAMIDOL (ISOVUE-300) INJECTION 61%
100.0000 mL | Freq: Once | INTRAVENOUS | Status: AC | PRN
  Administered 2018-08-31: 100 mL via INTRAVENOUS
  Filled 2018-08-31: qty 100

## 2018-08-31 MED ORDER — SODIUM CHLORIDE 0.9 % IV SOLN
Freq: Once | INTRAVENOUS | Status: AC
  Administered 2018-08-31: 16:00:00 via INTRAVENOUS

## 2018-08-31 MED ORDER — DIPHENOXYLATE-ATROPINE 2.5-0.025 MG PO TABS: 1.0000 | ORAL_TABLET | Freq: Four times a day (QID) | ORAL | 1 refills | Status: DC | PRN

## 2018-08-31 NOTE — ED Provider Notes (Signed)
[facility] Emergency Department Provider Note       Time seen: ----------------------------------------- 4:24 PM on 08/31/2018 -----------------------------------------   I have reviewed the triage vital signs and the nursing notes.  HISTORY   Chief Complaint Abdominal Pain    HPI Zachary Khan is a 42 y.o. male with a history of asthma, cholelithiasis, chronic back pain, GERD, migraines who presents to the ED for abdominal pain as well as persistent diarrhea.  Patient states he was here for GERD patient states the vomiting has eased off but now is having diarrhea and was seen here today after he was seen at an urgent care and noted to have right lower quadrant pain.  He denies fevers, chills or other complaints.  Past Medical History:  Diagnosis Date  . Asthma    AS A CHILD-NO INHALERS  . Cervical spine degeneration   . Chicken pox   . Cholelithiasis and acute cholecystitis without obstruction 12/08/2017  . Chronic back pain   . Frequent headaches   . GERD (gastroesophageal reflux disease)   . Migraine     Patient Active Problem List   Diagnosis Date Noted  . Insomnia 08/14/2018  . Preventative health care 08/14/2018  . Essential hypertension 11/17/2017  . History of cholecystectomy 11/13/2017  . Migraines 06/01/2016  . Chronic back pain 06/01/2016  . Chronic neck pain 06/01/2016    Past Surgical History:  Procedure Laterality Date  . CHOLECYSTECTOMY N/A 11/28/2017   Procedure: LAPAROSCOPIC CHOLECYSTECTOMY;  Surgeon: Ancil Linsey, MD;  Location: [facility] ORS;  Service: General;  Laterality: N/A;  . NO PAST SURGERIES      Allergies Hydrocodone-acetaminophen; Sulfa antibiotics; Sulfasalazine; and Vicodin [hydrocodone-acetaminophen]  Social History Social History   Tobacco Use  . Smoking status: Current Every Day Smoker    Packs/day: 0.25    Years: 20.00    Pack years: 5.00    Types: Cigarettes  . Smokeless tobacco: Never Used   Substance Use Topics  . Alcohol use: Yes    Comment: 2 BEERS ON WEEKEND  . Drug use: Yes    Types: Marijuana    Comment: OCC-NOT EVERYDAY   Review of Systems Constitutional: Negative for fever. Cardiovascular: Negative for chest pain. Respiratory: Negative for shortness of breath. Gastrointestinal: Positive for abdominal pain, recent vomiting and now with diarrhea Musculoskeletal: Negative for back pain. Skin: Negative for rash. Neurological: Negative for headaches, focal weakness or numbness.  All systems negative/normal/unremarkable except as stated in the HPI  ____________________________________________   PHYSICAL EXAM:  VITAL SIGNS: ED Triage Vitals  Enc Vitals Group     BP 08/31/18 1452 (!) 148/100     Pulse Rate 08/31/18 1452 73     Resp 08/31/18 1452 20     Temp 08/31/18 1452 98.4 F (36.9 C)     Temp Source 08/31/18 1452 Oral     SpO2 08/31/18 1452 99 %     Weight 08/31/18 1456 191 lb (86.6 kg)     Height 08/31/18 1456 5\' 7"  (1.702 m)     Head Circumference --      Peak Flow --      Pain Score 08/31/18 1456 6     Pain Loc --      Pain Edu? --      Excl. in GC? --    Constitutional: Alert and oriented. Well appearing and in no distress. Eyes: Conjunctivae are normal. Normal extraocular movements. Cardiovascular: Normal rate, regular rhythm. No murmurs, rubs, or gallops. Respiratory: Normal  respiratory effort without tachypnea nor retractions. Breath sounds are clear and equal bilaterally. No wheezes/rales/rhonchi. Gastrointestinal: Right lower quadrant and suprapubic tenderness, no rebound or guarding.  Normal bowel sounds. Musculoskeletal: Nontender with normal range of motion in extremities. No lower extremity tenderness nor edema. Neurologic:  Normal speech and language. No gross focal neurologic deficits are appreciated.  Skin:  Skin is warm, dry and intact. No rash noted. Psychiatric: Mood and affect are normal. Speech and behavior are normal.   ____________________________________________  ED COURSE:  As part of my medical decision making, I reviewed the following data within the electronic MEDICAL RECORD NUMBER History obtained from family if available, nursing notes, old chart and ekg, as well as notes from prior ED visits. Patient presented for abdominal pain, we will assess with labs and imaging as indicated at this time.   Procedures ____________________________________________   LABS (pertinent positives/negatives)  Labs Reviewed  COMPREHENSIVE METABOLIC PANEL - Abnormal; Notable for the following components:      Result Value   Total Bilirubin 1.7 (*)    All other components within normal limits  URINALYSIS, COMPLETE (UACMP) WITH MICROSCOPIC - Abnormal; Notable for the following components:   Color, Urine STRAW (*)    APPearance CLEAR (*)    Hgb urine dipstick SMALL (*)    All other components within normal limits  LIPASE, BLOOD  CBC    RADIOLOGY  CT the abdomen pelvis with contrast IMPRESSION: Benign-appearing abdomen and pelvis. Specifically, normal retrocecal appendix.  ____________________________________________  DIFFERENTIAL DIAGNOSIS   Colitis, diverticulitis, appendicitis, gastroenteritis, dehydration  FINAL ASSESSMENT AND PLAN  Abdominal pain, diarrhea   Plan: The patient had presented for abdominal pain and diarrhea. Patient's labs are unremarkable. Patient's imaging was unremarkable and reassuring.  He is cleared for outpatient follow-up.   Ulice Dash, MD   Note: This note was generated in part or whole with voice recognition software. Voice recognition is usually quite accurate but there are transcription errors that can and very often do occur. I apologize for any typographical errors that were not detected and corrected.     Emily Filbert, MD 08/31/18 [phone]

## 2018-08-31 NOTE — ED Triage Notes (Signed)
Pt stating that he was here for dehydration on Wednesday night. Pt stating his vomiting has "eased off." Pt stating that he has continued to have diarrhea since his previous visit. Pt stating he went to a [facility] at [facility] and the provider sent him here for a CT to r/o appendix due to RLQ pain with palpation.

## 2018-09-04 ENCOUNTER — Encounter: Payer: Self-pay | Admitting: Primary Care

## 2018-09-04 ENCOUNTER — Ambulatory Visit (INDEPENDENT_AMBULATORY_CARE_PROVIDER_SITE_OTHER): Payer: 59 | Admitting: Primary Care

## 2018-09-04 VITALS — BP 150/96 | HR 75 | Temp 98.2°F | Ht 66.5 in | Wt 193.5 lb

## 2018-09-04 DIAGNOSIS — G4700 Insomnia, unspecified: Secondary | ICD-10-CM | POA: Diagnosis not present

## 2018-09-04 DIAGNOSIS — I10 Essential (primary) hypertension: Secondary | ICD-10-CM | POA: Diagnosis not present

## 2018-09-04 MED ORDER — LISINOPRIL-HYDROCHLOROTHIAZIDE 20-25 MG PO TABS: 1.0000 | ORAL_TABLET | Freq: Every day | ORAL | 0 refills | Status: DC

## 2018-09-04 NOTE — Assessment & Plan Note (Signed)
Temporary improvement with Trazodone 50 mg, now no improvement. Will have him increase dose to 100 mg and update via My Chart in 1 week.

## 2018-09-04 NOTE — Patient Instructions (Signed)
Stop lisinopril 20 mg.  Start lisinopril-hydrochlorothiazide 20-25 mg tablet. Take 1 tablet once daily.  Check your BP at home as discussed.   Increase the Trazodone to 100 mg (two tablets) as discussed. Please update me via My Chart in one week.  Schedule a follow up visit in 2-3 weeks for blood pressure check.  It was a pleasure to see you today!

## 2018-09-04 NOTE — Assessment & Plan Note (Signed)
Uncontrolled on lisinopril 20 mg, he endorses compliance. Rx for lisinopril-HCTZ 20-25 mg sent to pharmacy. Recent BMP reviewed.   Will have him follow up in 2-3 weeks for BP check and repeat BMP.

## 2018-09-04 NOTE — Progress Notes (Signed)
Subjective:    Patient ID: Zachary Khan, male    DOB: Apr 29, 1976, 42 y.o.   MRN: [id number]  HPI  Zachary Khan is a 42 year old male who presents today for follow up of hypertension.  He was last evaluated in late October 2019 with numerous elevated blood pressure readings. It was decided to treat his uncontrolled readings with lisinopril 20 mg.   Since then he was evaluated he's been seen at [facility] on 08/29/18 and also on 08/31/18 for GI symptoms of nausea, vomiting, diarrhea, abdominal pain. He was treated with IV fluids, anti-inflammatories, antiemetics. He did undergo CT abdomen/pelvic on 08/31/18 which was unremarkable.   BP Readings from Last 3 Encounters:  09/04/18 (!) 150/96  08/31/18 (!) 149/97  08/29/18 (!) 155/103   He is compliant to his lisinopril 20 mg daily. He denies cough, chest pain. He has been taking Trazodone 50 mg at bedtime and experienced improvement initially but now cannot sleep.   Overall he's feeling better but continues to experience bilateral lower quadrant discomfort.   Review of Systems  Constitutional: Negative for fever.  Respiratory: Negative for cough and shortness of breath.   Cardiovascular: Negative for chest pain.  Gastrointestinal: Positive for abdominal pain and nausea. Negative for diarrhea and vomiting.  Neurological: Negative for dizziness.       Past Medical History:  Diagnosis Date  . Asthma    AS A CHILD-NO INHALERS  . Cervical spine degeneration   . Chicken pox   . Cholelithiasis and acute cholecystitis without obstruction 12/08/2017  . Chronic back pain   . Frequent headaches   . GERD (gastroesophageal reflux disease)   . Migraine      Social History   Socioeconomic History  . Marital status: Single    Spouse name: Not on file  . Number of children: Not on file  . Years of education: Not on file  . Highest education level: Not on file  Occupational History  . Not on file  Social Needs  . Financial resource strain: Not  on file  . Food insecurity:    Worry: Not on file    Inability: Not on file  . Transportation needs:    Medical: Not on file    Non-medical: Not on file  Tobacco Use  . Smoking status: Current Every Day Smoker    Packs/day: 0.25    Years: 20.00    Pack years: 5.00    Types: Cigarettes  . Smokeless tobacco: Never Used  Substance and Sexual Activity  . Alcohol use: Yes    Comment: 2 BEERS ON WEEKEND  . Drug use: Yes    Types: Marijuana    Comment: OCC-NOT EVERYDAY  . Sexual activity: Not on file  Lifestyle  . Physical activity:    Days per week: Not on file    Minutes per session: Not on file  . Stress: Not on file  Relationships  . Social connections:    Talks on phone: Not on file    Gets together: Not on file    Attends religious service: Not on file    Active member of club or organization: Not on file    Attends meetings of clubs or organizations: Not on file    Relationship status: Not on file  . Intimate partner violence:    Fear of current or ex partner: Not on file    Emotionally abused: Not on file    Physically abused: Not on file  Forced sexual activity: Not on file  Other Topics Concern  . Not on file  Social History Narrative   Single.   4 children.   Works as a Retail bankerservice technician.   Highest level of education: GED.    Past Surgical History:  Procedure Laterality Date  . CHOLECYSTECTOMY N/A 11/28/2017   Procedure: LAPAROSCOPIC CHOLECYSTECTOMY;  Surgeon: Ancil Linseyavis, Jason Evan, MD;  Location: [facility] ORS;  Service: General;  Laterality: N/A;  . NO PAST SURGERIES      Family History  Problem Relation Age of Onset  . Arthritis Mother   . Heart disease Mother   . Hypertension Mother   . Alcohol abuse Father   . ALS Father   . Rheum arthritis Maternal Grandmother     Allergies  Allergen Reactions  . Hydrocodone-Acetaminophen Hives  . Sulfa Antibiotics Hives  . Sulfasalazine Hives  . Vicodin [Hydrocodone-Acetaminophen] Hives    Current Outpatient  Medications on File Prior to Visit  Medication Sig Dispense Refill  . dicyclomine (BENTYL) 20 MG tablet Take 1 tablet (20 mg total) by mouth 3 (three) times daily as needed for spasms. 20 tablet 0  . diphenoxylate-atropine (LOMOTIL) 2.5-0.025 MG tablet Take 1 tablet by mouth 4 (four) times daily as needed for diarrhea or loose stools. 30 tablet 1  . famotidine (PEPCID) 20 MG tablet Take 1 tablet (20 mg total) by mouth 2 (two) times daily. 60 tablet 0  . ketorolac (TORADOL) 10 MG tablet Take 1 tablet (10 mg total) by mouth every 6 (six) hours as needed for moderate pain. 12 tablet 0  . lisinopril (PRINIVIL,ZESTRIL) 20 MG tablet Take 1 tablet by mouth once daily for blood pressure. 30 tablet 0  . ondansetron (ZOFRAN ODT) 4 MG disintegrating tablet Take 1 tablet (4 mg total) by mouth every 8 (eight) hours as needed for nausea or vomiting. 20 tablet 0  . SUMAtriptan (IMITREX) 100 MG tablet Take 1 tablet at migraine onset. May repeat in 2 hours if no resolve. Do not exceed 2 tablets in 24 hours. 10 tablet 0  . traZODone (DESYREL) 50 MG tablet Take 0.5-1 tablets (25-50 mg total) by mouth at bedtime as needed for sleep. 30 tablet 0   No current facility-administered medications on file prior to visit.     BP (!) 150/96   Pulse 75   Temp 98.2 F (36.8 C) (Oral)   Ht 5' 6.5" (1.689 m)   Wt 193 lb 8 oz (87.8 kg)   SpO2 97%   BMI 30.76 kg/m    Objective:   Physical Exam  Constitutional: He appears well-nourished.  Neck: Neck supple.  Cardiovascular: Normal rate and regular rhythm.  Respiratory: Effort normal and breath sounds normal.  GI: There is tenderness in the right lower quadrant and left lower quadrant.  Discomfort to bilateral lower quadrants with mild palpation.   Skin: Skin is warm and dry.  Psychiatric: He has a normal mood and affect.           Assessment & Plan:  ED Follow Up:  Presented for GI symptoms. Work up including CT and labs unremarkable. Exam today without  suspicion for acute appendicitis.  Suspect viral cause vs IBS. Continue conservative treatment. Return precautions provided.  Doreene NestKatherine K Keelin Sheridan, NP

## 2018-09-10 ENCOUNTER — Other Ambulatory Visit: Payer: Self-pay | Admitting: Primary Care

## 2018-09-10 DIAGNOSIS — I10 Essential (primary) hypertension: Secondary | ICD-10-CM

## 2018-09-10 DIAGNOSIS — G4700 Insomnia, unspecified: Secondary | ICD-10-CM

## 2018-09-25 ENCOUNTER — Ambulatory Visit (INDEPENDENT_AMBULATORY_CARE_PROVIDER_SITE_OTHER): Payer: 59 | Admitting: Primary Care

## 2018-09-25 VITALS — BP 144/94 | HR 71 | Temp 98.1°F | Ht 66.5 in | Wt 189.2 lb

## 2018-09-25 DIAGNOSIS — G43701 Chronic migraine without aura, not intractable, with status migrainosus: Secondary | ICD-10-CM | POA: Diagnosis not present

## 2018-09-25 DIAGNOSIS — IMO0002 Reserved for concepts with insufficient information to code with codable children: Secondary | ICD-10-CM

## 2018-09-25 DIAGNOSIS — G43709 Chronic migraine without aura, not intractable, without status migrainosus: Secondary | ICD-10-CM | POA: Diagnosis not present

## 2018-09-25 DIAGNOSIS — G4700 Insomnia, unspecified: Secondary | ICD-10-CM

## 2018-09-25 DIAGNOSIS — I10 Essential (primary) hypertension: Secondary | ICD-10-CM

## 2018-09-25 LAB — BASIC METABOLIC PANEL
BUN: 12 mg/dL (ref 6–23)
CALCIUM: 9.4 mg/dL (ref 8.4–10.5)
CO2: 32 meq/L (ref 19–32)
Chloride: 102 mEq/L (ref 96–112)
Creatinine, Ser: 1.04 mg/dL (ref 0.40–1.50)
GFR: 83.45 mL/min (ref >60.00)
GLUCOSE: 88 mg/dL (ref 70–99)
Potassium: 3.4 mEq/L — ABNORMAL LOW (ref 3.5–5.1)
Sodium: 141 mEq/L (ref 135–145)

## 2018-09-25 MED ORDER — TRAZODONE HCL 100 MG PO TABS: 100.0000 mg | ORAL_TABLET | Freq: Every day | ORAL | 1 refills | Status: DC

## 2018-09-25 MED ORDER — LISINOPRIL-HYDROCHLOROTHIAZIDE 20-25 MG PO TABS: 1.0000 | ORAL_TABLET | Freq: Every day | ORAL | 3 refills | Status: DC

## 2018-09-25 MED ORDER — AMLODIPINE BESYLATE 10 MG PO TABS: 10.0000 mg | ORAL_TABLET | Freq: Every day | ORAL | 0 refills | Status: DC

## 2018-09-25 MED ORDER — SUMATRIPTAN SUCCINATE 100 MG PO TABS: ORAL_TABLET | ORAL | 0 refills | Status: AC

## 2018-09-25 NOTE — Assessment & Plan Note (Signed)
Continues to be above goal despite lisinopril-HCTZ 20-25 mg. Add in Amlodipine 10 mg. Consider looking at secondary causes for uncontrolled hypertension including renal artery stenosis.   BMP pending. Follow up in 2-3 weeks for BP check.

## 2018-09-25 NOTE — Assessment & Plan Note (Signed)
Doing better on Trazodone 100 mg, refills sent.

## 2018-09-25 NOTE — Assessment & Plan Note (Signed)
Continues. Suspect partially secondary to uncontrolled hypertension. Will continue to treat hypertension and if migraines persist despite better BP control, then will consider treatment for daily prevention of headaches.

## 2018-09-25 NOTE — Progress Notes (Signed)
Subjective:    Patient ID: Zachary Khan, male    DOB: Feb 07, 1976, 42 y.o.   MRN: [id number]  HPI  Zachary Khan is a 42 year old male who presents today for follow up of hypertension. He is also requesting a refill of his Imitrex that was last filled on 08/14/18.   He was last evaluated on 09/04/18 with continued elevated blood pressure readings despite management on lisinopril 20 mg. HCTZ 25 mg was added last visit.  BP Readings from Last 3 Encounters:  09/25/18 (!) 144/94  09/04/18 (!) 150/96  08/31/18 (!) 149/97    Since his last visit he checked his BP once at [facility] which was 146/92. He continues to experience headaches, last week he had three migraines. He denies cough, dizziness.   Review of Systems  Eyes: Negative for visual disturbance.  Respiratory: Negative for shortness of breath.   Cardiovascular: Negative for chest pain.  Neurological: Positive for headaches. Negative for dizziness.       Past Medical History:  Diagnosis Date  . Asthma    AS A CHILD-NO INHALERS  . Cervical spine degeneration   . Chicken pox   . Cholelithiasis and acute cholecystitis without obstruction 12/08/2017  . Chronic back pain   . Frequent headaches   . GERD (gastroesophageal reflux disease)   . Migraine      Social History   Socioeconomic History  . Marital status: Single    Spouse name: Not on file  . Number of children: Not on file  . Years of education: Not on file  . Highest education level: Not on file  Occupational History  . Not on file  Social Needs  . Financial resource strain: Not on file  . Food insecurity:    Worry: Not on file    Inability: Not on file  . Transportation needs:    Medical: Not on file    Non-medical: Not on file  Tobacco Use  . Smoking status: Current Every Day Smoker    Packs/day: 0.25    Years: 20.00    Pack years: 5.00    Types: Cigarettes  . Smokeless tobacco: Never Used  Substance and Sexual Activity  . Alcohol use: Yes   Comment: 2 BEERS ON WEEKEND  . Drug use: Yes    Types: Marijuana    Comment: OCC-NOT EVERYDAY  . Sexual activity: Not on file  Lifestyle  . Physical activity:    Days per week: Not on file    Minutes per session: Not on file  . Stress: Not on file  Relationships  . Social connections:    Talks on phone: Not on file    Gets together: Not on file    Attends religious service: Not on file    Active member of club or organization: Not on file    Attends meetings of clubs or organizations: Not on file    Relationship status: Not on file  . Intimate partner violence:    Fear of current or ex partner: Not on file    Emotionally abused: Not on file    Physically abused: Not on file    Forced sexual activity: Not on file  Other Topics Concern  . Not on file  Social History Narrative   Single.   4 children.   Works as a Retail banker.   Highest level of education: GED.    Past Surgical History:  Procedure Laterality Date  . CHOLECYSTECTOMY N/A 11/28/2017   Procedure: LAPAROSCOPIC  CHOLECYSTECTOMY;  Surgeon: Ancil Linseyavis, Jason Evan, MD;  Location: [facility] ORS;  Service: General;  Laterality: N/A;  . NO PAST SURGERIES      Family History  Problem Relation Age of Onset  . Arthritis Mother   . Heart disease Mother   . Hypertension Mother   . Alcohol abuse Father   . ALS Father   . Rheum arthritis Maternal Grandmother     Allergies  Allergen Reactions  . Hydrocodone-Acetaminophen Hives  . Sulfa Antibiotics Hives  . Sulfasalazine Hives  . Vicodin [Hydrocodone-Acetaminophen] Hives    Current Outpatient Medications on File Prior to Visit  Medication Sig Dispense Refill  . famotidine (PEPCID) 20 MG tablet Take 1 tablet (20 mg total) by mouth 2 (two) times daily. 60 tablet 0  . ketorolac (TORADOL) 10 MG tablet Take 1 tablet (10 mg total) by mouth every 6 (six) hours as needed for moderate pain. 12 tablet 0  . ondansetron (ZOFRAN ODT) 4 MG disintegrating tablet Take 1 tablet (4 mg  total) by mouth every 8 (eight) hours as needed for nausea or vomiting. 20 tablet 0   No current facility-administered medications on file prior to visit.     BP (!) 144/94   Pulse 71   Temp 98.1 F (36.7 C) (Oral)   Ht 5' 6.5" (1.689 m)   Wt 189 lb 4 oz (85.8 kg)   SpO2 98%   BMI 30.09 kg/m    Objective:   Physical Exam  Constitutional: He appears well-nourished.  Neck: Neck supple.  Cardiovascular: Normal rate and regular rhythm.  Respiratory: Effort normal and breath sounds normal.  Skin: Skin is warm and dry.           Assessment & Plan:

## 2018-09-25 NOTE — Patient Instructions (Signed)
Continue taking lisinopril-hydrochlorothiazide 20-25 mg for blood pressure.  Start taking Amlodipine 10 mg once daily for high blood pressure.  I sent refills for Trazodone and increased the dose to 100 mg.  I sent refills for sumatriptan (Imitrex).  Stop by the lab prior to leaving today. I will notify you of your results once received.   Schedule a follow up visit in 2-3 weeks for blood pressure check.  It was a pleasure to see you today!

## 2018-10-10 ENCOUNTER — Other Ambulatory Visit: Payer: Self-pay | Admitting: Primary Care

## 2018-10-10 DIAGNOSIS — G4700 Insomnia, unspecified: Secondary | ICD-10-CM

## 2018-10-15 ENCOUNTER — Ambulatory Visit: Payer: 59 | Admitting: Primary Care

## 2018-10-15 DIAGNOSIS — Z0289 Encounter for other administrative examinations: Secondary | ICD-10-CM

## 2018-10-22 ENCOUNTER — Other Ambulatory Visit: Payer: Self-pay | Admitting: Primary Care

## 2018-10-22 DIAGNOSIS — I10 Essential (primary) hypertension: Secondary | ICD-10-CM

## 2018-11-18 ENCOUNTER — Other Ambulatory Visit: Payer: Self-pay | Admitting: Primary Care

## 2018-11-18 DIAGNOSIS — I10 Essential (primary) hypertension: Secondary | ICD-10-CM

## 2018-11-20 NOTE — Telephone Encounter (Signed)
Please call patient: how's his BP running since we added in Amlodipine? Is he still taking lisinopril-HCTZ along with?

## 2018-11-20 NOTE — Telephone Encounter (Signed)
Last prescribed on 10/22/2018. Last office visit on 09/25/2018. Patient no show on 10/15/2018. No future appointment

## 2018-11-26 NOTE — Telephone Encounter (Signed)
Noted, refill sent to pharmacy. 

## 2018-11-26 NOTE — Telephone Encounter (Signed)
Patient called back. He stated that he been checking his BP and it is much better. He is taking the all three medications as he was told. When he has been out of this medication but forgot to call back. BP reading are like 135-120 over 90-80.

## 2018-12-11 ENCOUNTER — Ambulatory Visit (INDEPENDENT_AMBULATORY_CARE_PROVIDER_SITE_OTHER): Payer: 59 | Admitting: Primary Care

## 2018-12-11 ENCOUNTER — Encounter: Payer: Self-pay | Admitting: Primary Care

## 2018-12-11 ENCOUNTER — Ambulatory Visit (INDEPENDENT_AMBULATORY_CARE_PROVIDER_SITE_OTHER)
Admission: RE | Admit: 2018-12-11 | Discharge: 2018-12-11 | Disposition: A | Discharge status: Home / Self Care | Payer: 59 | Source: Ambulatory Visit | Attending: Primary Care | Admitting: Primary Care

## 2018-12-11 VITALS — BP 136/86 | HR 78 | Temp 98.2°F | Ht 66.5 in | Wt 184.0 lb

## 2018-12-11 DIAGNOSIS — G8929 Other chronic pain: Secondary | ICD-10-CM | POA: Insufficient documentation

## 2018-12-11 DIAGNOSIS — I10 Essential (primary) hypertension: Secondary | ICD-10-CM

## 2018-12-11 DIAGNOSIS — M25539 Pain in unspecified wrist: Secondary | ICD-10-CM | POA: Diagnosis not present

## 2018-12-11 DIAGNOSIS — M25531 Pain in right wrist: Secondary | ICD-10-CM | POA: Diagnosis not present

## 2018-12-11 DIAGNOSIS — M25532 Pain in left wrist: Secondary | ICD-10-CM | POA: Diagnosis not present

## 2018-12-11 MED ORDER — DICLOFENAC SODIUM 75 MG PO TBEC: 75.0000 mg | DELAYED_RELEASE_TABLET | Freq: Two times a day (BID) | ORAL | 0 refills | Status: DC

## 2018-12-11 NOTE — Assessment & Plan Note (Signed)
Suspect secondary to repetitive movement at work. Check plain films today. Discussed use of brace/support during work hours. Rx for diclofenac sent to pharmacy, discussed to stop Aleve. Will have him see Sports Medicine for further evaluation.

## 2018-12-11 NOTE — Assessment & Plan Note (Signed)
Improved, continue to work on lifestyle changes. Continue current regimen.

## 2018-12-11 NOTE — Progress Notes (Signed)
Subjective:    Patient ID: Zachary Khan, male    DOB: [birth year], 43 y.o.   MRN: [id number]  HPI  Zachary Khan is a 43 year old male with a history of chronic back and neck pain who presents today with a chief complaint of wrist pain.   His pain is located to the bilateral wrists, left more than right. His wrist will "lock up" with lateral rotation during his work day which will cause a shooting pain to his entire body. Symptoms initially began in Fall of 2019. His symptoms are worse in colder weather, improved in warmer weather  He purchased compression gloves for which he wears while at work during the day, this has not provided improvement, will sometimes "cut off circulation". He works using his hands for most of his work day, a lot of repetitive movement. 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 His position at work changed in early January 2020, he was previously driving a fork lift.  He works 12 hour days, 3-5 days weekly. His pain does improve when not working, but does still notice.   He's taking Aleve several times daily without much improvement. He denies injury, numbness/tingling, forearm or upper extremity pain. He has noticed his grip strength has reduced, also swelling to the MCP joints.   Review of Systems  Musculoskeletal: Positive for arthralgias and joint swelling.  Skin: Negative for color change.  Neurological: Negative for numbness.       Past Medical History:  Diagnosis Date  . Asthma    AS A CHILD-NO INHALERS  . Cervical spine degeneration   . Chicken pox   . Cholelithiasis and acute cholecystitis without obstruction 12/08/2017  . Chronic back pain   . Frequent headaches   . GERD (gastroesophageal reflux disease)   . Migraine      Social History   Socioeconomic History  . Marital status: Single    Spouse name: Not on file  . Number of children: Not on file  . Years of education: Not on file  . Highest education level:  Not on file  Occupational History  . Not on file  Social Needs  . Financial resource strain: Not on file  . Food insecurity:    Worry: Not on file    Inability: Not on file  . Transportation needs:    Medical: Not on file    Non-medical: Not on file  Tobacco Use  . Smoking status: Current Every Day Smoker    Packs/day: 0.25    Years: 20.00    Pack years: 5.00    Types: Cigarettes  . Smokeless tobacco: Never Used  Substance and Sexual Activity  . Alcohol use: Yes    Comment: 2 BEERS ON WEEKEND  . Drug use: Yes    Types: Marijuana    Comment: OCC-NOT EVERYDAY  . Sexual activity: Not on file  Lifestyle  . Physical activity:    Days per week: Not on file    Minutes per session: Not on file  . Stress: Not on file  Relationships  . Social connections:    Talks on phone: Not on file    Gets together: Not on file    Attends religious service: Not on file    Active member of club or organization: Not on file    Attends meetings of clubs or organizations: Not on file    Relationship status: Not on file  . Intimate partner  violence:    Fear of current or ex partner: Not on file    Emotionally abused: Not on file    Physically abused: Not on file    Forced sexual activity: Not on file  Other Topics Concern  . Not on file  Social History Narrative   Single.   4 children.   Works as a Retail banker.   Highest level of education: GED.    Past Surgical History:  Procedure Laterality Date  . CHOLECYSTECTOMY N/A 11/28/2017   Procedure: LAPAROSCOPIC CHOLECYSTECTOMY;  Surgeon: Ancil Linsey, MD;  Location: [facility] ORS;  Service: General;  Laterality: N/A;  . NO PAST SURGERIES      Family History  Problem Relation Age of Onset  . Arthritis Mother   . Heart disease Mother   . Hypertension Mother   . Alcohol abuse Father   . ALS Father   . Rheum arthritis Maternal Grandmother     Allergies  Allergen Reactions  . Hydrocodone-Acetaminophen Hives  . Sulfa Antibiotics  Hives  . Sulfasalazine Hives  . Vicodin [Hydrocodone-Acetaminophen] Hives    Current Outpatient Medications on File Prior to Visit  Medication Sig Dispense Refill  . amLODipine (NORVASC) 10 MG tablet TAKE 1 TABLET(10 MG) BY MOUTH DAILY FOR BLOOD PRESSURE 90 tablet 3  . famotidine (PEPCID) 20 MG tablet Take 1 tablet (20 mg total) by mouth 2 (two) times daily. 60 tablet 0  . lisinopril-hydrochlorothiazide (PRINZIDE,ZESTORETIC) 20-25 MG tablet Take 1 tablet by mouth daily. For blood pressure. 90 tablet 3  . traZODone (DESYREL) 100 MG tablet Take 1 tablet (100 mg total) by mouth at bedtime. For sleep. 90 tablet 1  . SUMAtriptan (IMITREX) 100 MG tablet Take 1 tablet at migraine onset. May repeat in 2 hours if no resolve. Do not exceed 2 tablets in 24 hours. (Patient not taking: Reported on 12/11/2018) 10 tablet 0   No current facility-administered medications on file prior to visit.     BP 136/86   Pulse 78   Temp 98.2 F (36.8 C) (Oral)   Ht 5' 6.5" (1.689 m)   Wt 184 lb (83.5 kg)   SpO2 97%   BMI 29.25 kg/m    Objective:   Physical Exam  Constitutional: He appears well-nourished.  Cardiovascular:  Pulses:      Radial pulses are 2+ on the right side and 2+ on the left side.  Musculoskeletal:     Left wrist: He exhibits decreased range of motion and swelling. He exhibits no tenderness and no bony tenderness.     Comments: Pain with decrease in ROM with lateral and medial rotation. Mild swelling to ulnar side. Right wrist with increased ROM when compared to left, no swelling. Grips equal bilaterally.   Skin: Skin is warm and dry. No erythema.           Assessment & Plan:

## 2018-12-11 NOTE — Patient Instructions (Signed)
Stop taking Aleve.  Start diclofenac 75 mg tablets for pain and inflammation. You can also add in Tylenol 500 mg in between doses.   Consider the wrist splints as discussed, these will provide support.  Complete xray(s) prior to leaving today. I will notify you of your results once received.  Schedule a visit with Dr. Patsy Lager as discussed.  It was a pleasure to see you today!

## 2018-12-12 ENCOUNTER — Encounter: Payer: Self-pay | Admitting: Family Medicine

## 2018-12-12 ENCOUNTER — Ambulatory Visit (INDEPENDENT_AMBULATORY_CARE_PROVIDER_SITE_OTHER): Payer: 59 | Admitting: Family Medicine

## 2018-12-12 VITALS — BP 102/62 | HR 72 | Temp 98.6°F | Ht 66.5 in | Wt 183.8 lb

## 2018-12-12 DIAGNOSIS — M25432 Effusion, left wrist: Secondary | ICD-10-CM | POA: Diagnosis not present

## 2018-12-12 DIAGNOSIS — M2550 Pain in unspecified joint: Secondary | ICD-10-CM

## 2018-12-12 DIAGNOSIS — M25532 Pain in left wrist: Secondary | ICD-10-CM

## 2018-12-12 DIAGNOSIS — R29898 Other symptoms and signs involving the musculoskeletal system: Secondary | ICD-10-CM

## 2018-12-12 LAB — CBC WITH DIFFERENTIAL/PLATELET
BASOS ABS: 0.1 10*3/uL (ref 0.0–0.1)
Basophils Relative: 0.9 % (ref 0.0–3.0)
Eosinophils Absolute: 0.7 10*3/uL (ref 0.0–0.7)
Eosinophils Relative: 8.3 % — ABNORMAL HIGH (ref 0.0–5.0)
HCT: 45.7 % (ref 39.0–52.0)
Hemoglobin: 15.8 g/dL (ref 13.0–17.0)
Lymphocytes Relative: 33.9 % (ref 12.0–46.0)
Lymphs Abs: 2.8 10*3/uL (ref 0.7–4.0)
MCHC: 34.5 g/dL (ref 30.0–36.0)
MCV: 92.5 fl (ref 78.0–100.0)
MONO ABS: 0.8 10*3/uL (ref 0.1–1.0)
Monocytes Relative: 10.1 % (ref 3.0–12.0)
NEUTROS PCT: 46.8 % (ref 43.0–77.0)
Neutro Abs: 3.8 10*3/uL (ref 1.4–7.7)
Platelets: 278 10*3/uL (ref 150.0–400.0)
RBC: 4.95 Mil/uL (ref 4.22–5.81)
RDW: 13.3 % (ref 11.5–15.5)
WBC: 8.2 10*3/uL (ref 4.0–10.5)

## 2018-12-12 LAB — SEDIMENTATION RATE: SED RATE: 1 mm/h (ref 0–15)

## 2018-12-12 LAB — HIGH SENSITIVITY CRP: CRP HIGH SENSITIVITY: 0.36 mg/L (ref 0.000–5.000)

## 2018-12-12 MED ORDER — PREDNISONE 20 MG PO TABS: ORAL_TABLET | ORAL | 0 refills | Status: DC

## 2018-12-12 NOTE — Progress Notes (Addendum)
Dr. Karleen Hampshire T. Harjit Douds, MD, CAQ [facility] [address] [place], [address] Phone: [phone] Fax: [phone]  12/12/2018  Patient: Zachary Khan, MRN: [id number], DOB: [birth year], 43 y.o.  Primary Physician:  Doreene Nest, NP   Chief Complaint  Patient presents with  . Wrist Pain    Left   Subjective:   Zachary Khan is a 43 y.o. very pleasant male patient who presents with the following:  43 year old very pleasant gentleman who is having some significant left-sided wrist pain greater than the right side that is been going on for approximately 4 to 6 months.  He does have some polyarthralgia in multiple joints as well as relatively chronic neck and back pain.  He currently has a true wrist effusion as well as some minimal synovitis particular at the MCP joints.  He does have a grandmother who has rheumatoid arthritis.  He has no particular memory of having a traumatic incident with this wrist.  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  He has had decrease in his grip strength as well as his ability to move the wrist.  He does a lot of repetitive motion at work. L > R wrist pain.  4-6 months  MGM has RA  Hurts some with getting colder. In the last 6 months every day.  Constant pain and will radiate and has some back pain and neck pain.   ? Scapholunate widening on the L  Past Medical History, Surgical History, Social History, Family History, Problem List, Medications, and Allergies have been reviewed and updated if relevant.  Patient Active Problem List   Diagnosis Date Noted  . 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 Insomnia 08/14/2018  . Preventative health care 08/14/2018  . Essential hypertension 11/17/2017  . History of cholecystectomy 11/13/2017  . Migraines 06/01/2016    . Chronic back pain 06/01/2016  . Chronic neck pain 06/01/2016    Past Medical History:  Diagnosis Date  . Asthma    AS A CHILD-NO INHALERS  . Cervical spine degeneration   . Chicken pox   . Cholelithiasis and acute cholecystitis without obstruction 12/08/2017  . Chronic back pain   . Frequent headaches   . GERD (gastroesophageal reflux disease)   . Migraine     Past Surgical History:  Procedure Laterality Date  . CHOLECYSTECTOMY N/A 11/28/2017   Procedure: LAPAROSCOPIC CHOLECYSTECTOMY;  Surgeon: Ancil Linsey, MD;  Location: [facility] ORS;  Service: General;  Laterality: N/A;  . NO PAST SURGERIES      Social History   Socioeconomic History  . Marital status: Single    Spouse name: Not on file  . Number of children: Not on file  . Years of education: Not on file  . Highest education level: Not on file  Occupational History  . Not on file  Social Needs  . Financial resource strain: Not on file  . Food insecurity:    Worry: Not on file    Inability: Not on file  . Transportation needs:    Medical: Not on file    Non-medical: Not on file  Tobacco Use  . Smoking status: Current Every Day Smoker    Packs/day: 0.25    Years: 20.00    Pack years: 5.00    Types: Cigarettes  . Smokeless  tobacco: Never Used  Substance and Sexual Activity  . Alcohol use: Yes    Comment: 2 BEERS ON WEEKEND  . Drug use: Yes    Types: Marijuana    Comment: OCC-NOT EVERYDAY  . Sexual activity: Not on file  Lifestyle  . Physical activity:    Days per week: Not on file    Minutes per session: Not on file  . Stress: Not on file  Relationships  . Social connections:    Talks on phone: Not on file    Gets together: Not on file    Attends religious service: Not on file    Active member of club or organization: Not on file    Attends meetings of clubs or organizations: Not on file    Relationship status: Not on file  . Intimate partner violence:    Fear of current or ex partner: Not on  file    Emotionally abused: Not on file    Physically abused: Not on file    Forced sexual activity: Not on file  Other Topics Concern  . Not on file  Social History Narrative   Single.   4 children.   Works as a Retail banker.   Highest level of education: GED.    Family History  Problem Relation Age of Onset  . Arthritis Mother   . Heart disease Mother   . Hypertension Mother   . Alcohol abuse Father   . ALS Father   . Rheum arthritis Maternal Grandmother     Allergies  Allergen Reactions  . Hydrocodone-Acetaminophen Hives  . Sulfa Antibiotics Hives  . Sulfasalazine Hives  . Vicodin [Hydrocodone-Acetaminophen] Hives    Medication list reviewed and updated in full in [facility].  GEN: No fevers, chills. Nontoxic. Primarily MSK c/o today. MSK: Detailed in the HPI GI: tolerating PO intake without difficulty Neuro: No numbness, parasthesias, or tingling associated. Otherwise the pertinent positives of the ROS are noted above.   Objective:   BP 102/62   Pulse 72   Temp 98.6 F (37 C) (Oral)   Ht 5' 6.5" (1.689 m)   Wt 183 lb 12 oz (83.3 kg)   BMI 29.21 kg/m    GEN: WDWN, NAD, Non-toxic, Alert & Oriented x 3 HEENT: Atraumatic, Normocephalic.  Ears and Nose: No external deformity. EXTR: No clubbing/cyanosis/edema NEURO: Normal gait.  PSYCH: Normally interactive. Conversant. Not depressed or anxious appearing.  Calm demeanor.   Hand: L Ecchymosis or edema: He has a true wrist effusion. ROM wrist/hand/digits/elbow: Notable loss of motion approximately 50% in all directions in the left wrist. Carpals, MCP's, digits: There is some bogginess and synovitis in multiple MCP joints, left greater than right Distal Ulna and Radius: NT Supination lift test: Positive on the left Cysts/nodules: neg Finkelstein's test: neg Snuffbox tenderness: neg Scaphoid tubercle: NT Hook of Hamate: NT Resisted supination: Tender Full composite fist Grip, all digits:  4/5 str on the left No tenosynovitis Axial load test: Notably tender Phalen's: neg Tinel's: neg Atrophy: neg  Hand sensation: intact   Radiology: Dg Wrist Complete Left  Result Date: 12/11/2018 CLINICAL DATA:  Chronic bilateral wrist pain, left greater than right for 3-4 months. EXAM: LEFT WRIST - COMPLETE 3+ VIEW COMPARISON:  None. FINDINGS: The mineralization and alignment are normal. There is no evidence of acute fracture or dislocation. Mild dorsal tilting of the lunate is within physiologic limits. The joint spaces are maintained. No erosive changes, foreign bodies or focal soft tissue swelling. IMPRESSION: No  acute findings or significant arthropathic changes. Electronically Signed   By: Carey Bullocks M.D.   On: 12/11/2018 17:24   The radiological images were independently reviewed by myself in the office and results were reviewed with the patient. My independent interpretation of images: There appears to be no acute fracture, and no dislocation.  There appears to be possible widening at the scapholunate space. Electronically Signed  By: Hannah Beat, MD On: 12/17/2018 2:41 PM   Results for orders placed or performed in visit on 12/12/18  CBC with Differential/Platelet  Result Value Ref Range   WBC 8.2 4.0 - 10.5 K/uL   RBC 4.95 4.22 - 5.81 Mil/uL   Hemoglobin 15.8 13.0 - 17.0 g/dL   HCT 28.3 66.2 - 94.7 %   MCV 92.5 78.0 - 100.0 fl   MCHC 34.5 30.0 - 36.0 g/dL   RDW 65.4 65.0 - 35.4 %   Platelets 278.0 150.0 - 400.0 K/uL   Neutrophils Relative % 46.8 43.0 - 77.0 %   Lymphocytes Relative 33.9 12.0 - 46.0 %   Monocytes Relative 10.1 3.0 - 12.0 %   Eosinophils Relative 8.3 (H) 0.0 - 5.0 %   Basophils Relative 0.9 0.0 - 3.0 %   Neutro Abs 3.8 1.4 - 7.7 K/uL   Lymphs Abs 2.8 0.7 - 4.0 K/uL   Monocytes Absolute 0.8 0.1 - 1.0 K/uL   Eosinophils Absolute 0.7 0.0 - 0.7 K/uL   Basophils Absolute 0.1 0.0 - 0.1 K/uL  High sensitivity CRP  Result Value Ref Range   CRP, High  Sensitivity 0.360 0.000 - 5.000 mg/L  Sedimentation rate  Result Value Ref Range   Sed Rate 1 0 - 15 mm/hr  ANA Screen,IFA,Reflex Titer/Pattern,Reflex Mplx 11 Ab Cascade with IdentRA  Result Value Ref Range   Anti Nuclear Antibody(ANA) NEGATIVE NEGATIVE   Rhuematoid fact SerPl-aCnc <14 <14 IU/mL   Cyclic Citrullin Peptide Ab <65 UNITS   14-3-3 eta Protein <0.2 <0.2 ng/mL     Assessment and Plan:   Polyarthralgia - Plan: CBC with Differential/Platelet, High sensitivity CRP, Sedimentation rate, ANA Screen,IFA,Reflex Titer/Pattern,Reflex Mplx 11 Ab Cascade with IdentRA, CANCELED: ANA Screen,IFA,Reflex Titer/Pattern,Reflex Mplx 11 Ab Cascade with IdentRA  Wrist effusion, left - Plan: CBC with Differential/Platelet, High sensitivity CRP, Sedimentation rate, ANA Screen,IFA,Reflex Titer/Pattern,Reflex Mplx 11 Ab Cascade with IdentRA, MR WRIST LEFT W CONTRAST, DG FLUORO GUIDED NEEDLE PLC ASPIRATION/INJECTION LOC, CANCELED: ANA Screen,IFA,Reflex Titer/Pattern,Reflex Mplx 11 Ab Cascade with IdentRA  Wrist pain, left - Plan: CBC with Differential/Platelet, High sensitivity CRP, Sedimentation rate, ANA Screen,IFA,Reflex Titer/Pattern,Reflex Mplx 11 Ab Cascade with IdentRA, MR WRIST LEFT W CONTRAST, DG FLUORO GUIDED NEEDLE PLC ASPIRATION/INJECTION LOC, CANCELED: ANA Screen,IFA,Reflex Titer/Pattern,Reflex Mplx 11 Ab Cascade with IdentRA  Wrist weakness - Plan: MR WRIST LEFT W CONTRAST, DG FLUORO GUIDED NEEDLE PLC ASPIRATION/INJECTION LOC  Reasonably complicated wrist case in a patient with diffuse polyarthralgias, wrist effusion, wrist synovitis, as well as some MCP synovitis.  With a family history of rheumatoid arthritis and polyarthralgia, I am going to do a rheumatological work-up.  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  Wrist internal derangement of some type is likely.  Continue  cock-up wrist splint, and get a pulse him with some oral steroids and then touch base with him again to see how he is feeling.    If symptoms are persistent, rheumatological work-up is negative, then the next reasonable step  would be to get an MR arthrogram of his left wrist to evaluate for scapholunate dissociation, TFCC injury, or other ligamentous disruption in his left wrist.  Addendum 12/17/2018: The patient's entire rheumatological work-up was completely normal.  I have placed him on some oral steroids, and we checked in with him verbally today, and he is doing no better.  Given his high level of functional impairment, length of symptoms for 4 to 6 months, I am going to obtain a MR arthrogram of his left wrist to evaluate for occult fracture, scapholunate dissociation, TFCC injury, or other ligamentous disruption in the patient's left wrist causing his ongoing impairment.  Follow-up: depends on additional tests  Meds ordered this encounter  Medications  . predniSONE (DELTASONE) 20 MG tablet    Sig: 2 tabs po daily for 5 days, then 1 tab po daily for 5 days    Dispense:  15 tablet    Refill:  0   Orders Placed This Encounter  Procedures  . MR WRIST LEFT W CONTRAST  . DG FLUORO GUIDED NEEDLE PLC ASPIRATION/INJECTION LOC  . CBC with Differential/Platelet  . High sensitivity CRP  . Sedimentation rate  . ANA Screen,IFA,Reflex Titer/Pattern,Reflex Mplx 11 Ab Cascade with [facility]    Signed,  Axzel Rockhill T. Lakeitha Basques, MD   Outpatient Encounter Medications as of 12/12/2018  Medication Sig  . amLODipine (NORVASC) 10 MG tablet TAKE 1 TABLET(10 MG) BY MOUTH DAILY FOR BLOOD PRESSURE  . diclofenac (VOLTAREN) 75 MG EC tablet Take 1 tablet (75 mg total) by mouth 2 (two) times daily. As needed for pain and inflammation.  . famotidine (PEPCID) 20 MG tablet Take 1 tablet (20 mg total) by mouth 2 (two) times daily.  Marland Kitchen. lisinopril-hydrochlorothiazide (PRINZIDE,ZESTORETIC) 20-25 MG tablet Take 1 tablet  by mouth daily. For blood pressure.  . SUMAtriptan (IMITREX) 100 MG tablet Take 1 tablet at migraine onset. May repeat in 2 hours if no resolve. Do not exceed 2 tablets in 24 hours.  . traZODone (DESYREL) 100 MG tablet Take 1 tablet (100 mg total) by mouth at bedtime. For sleep.  . predniSONE (DELTASONE) 20 MG tablet 2 tabs po daily for 5 days, then 1 tab po daily for 5 days   No facility-administered encounter medications on file as of 12/12/2018.

## 2018-12-16 LAB — ANA SCREEN,IFA,REFLEX TITER/PATTERN,REFLEX MPLX 11 AB CASCADE: ANA: NEGATIVE

## 2018-12-17 NOTE — Addendum Note (Signed)
Addended by: Hannah Beat on: 12/17/2018 02:41 PM   Modules accepted: Orders

## 2018-12-23 ENCOUNTER — Other Ambulatory Visit: Payer: Self-pay | Admitting: Primary Care

## 2018-12-23 DIAGNOSIS — G8929 Other chronic pain: Secondary | ICD-10-CM

## 2018-12-23 DIAGNOSIS — M25539 Pain in unspecified wrist: Principal | ICD-10-CM

## 2018-12-27 NOTE — Telephone Encounter (Signed)
Last prescribed on 12/11/2018  . Last office visit on 12/12/2018 with Dr Patsy Lager. No future appointment

## 2018-12-27 NOTE — Telephone Encounter (Signed)
Noted.  Refill sent to pharmacy. Patient currently under work-up per Dr. Patsy Lager.  MRI scheduled for 01/04/2019.

## 2019-01-04 ENCOUNTER — Ambulatory Visit
Admission: RE | Admit: 2019-01-04 | Discharge: 2019-01-04 | Disposition: A | Discharge status: Home / Self Care | Payer: 59 | Source: Ambulatory Visit | Attending: Family Medicine | Admitting: Family Medicine

## 2019-01-04 ENCOUNTER — Other Ambulatory Visit: Payer: Self-pay

## 2019-01-04 DIAGNOSIS — M25532 Pain in left wrist: Secondary | ICD-10-CM | POA: Diagnosis not present

## 2019-01-04 DIAGNOSIS — M25432 Effusion, left wrist: Secondary | ICD-10-CM

## 2019-01-04 DIAGNOSIS — R29898 Other symptoms and signs involving the musculoskeletal system: Secondary | ICD-10-CM

## 2019-01-04 DIAGNOSIS — S63512A Sprain of carpal joint of left wrist, initial encounter: Secondary | ICD-10-CM | POA: Diagnosis not present

## 2019-01-04 MED ORDER — IOPAMIDOL (ISOVUE-M 200) INJECTION 41%
1.5000 mL | Freq: Once | INTRAMUSCULAR | Status: AC
  Administered 2019-01-04: 1.5 mL via INTRA_ARTICULAR

## 2019-01-08 ENCOUNTER — Other Ambulatory Visit: Payer: Self-pay | Admitting: Family Medicine

## 2019-01-08 DIAGNOSIS — M25332 Other instability, left wrist: Secondary | ICD-10-CM

## 2019-01-08 DIAGNOSIS — M25532 Pain in left wrist: Secondary | ICD-10-CM

## 2019-01-08 DIAGNOSIS — G8929 Other chronic pain: Secondary | ICD-10-CM

## 2019-01-11 ENCOUNTER — Ambulatory Visit (INDEPENDENT_AMBULATORY_CARE_PROVIDER_SITE_OTHER): Payer: 59 | Admitting: Family Medicine

## 2019-01-11 ENCOUNTER — Encounter: Payer: Self-pay | Admitting: *Deleted

## 2019-01-11 ENCOUNTER — Other Ambulatory Visit: Payer: Self-pay | Admitting: Family Medicine

## 2019-01-11 ENCOUNTER — Other Ambulatory Visit: Payer: Self-pay

## 2019-01-11 DIAGNOSIS — M25539 Pain in unspecified wrist: Secondary | ICD-10-CM

## 2019-01-11 DIAGNOSIS — G8929 Other chronic pain: Secondary | ICD-10-CM

## 2019-01-11 DIAGNOSIS — R6889 Other general symptoms and signs: Secondary | ICD-10-CM

## 2019-01-11 MED ORDER — TRAMADOL HCL 50 MG PO TABS: ORAL_TABLET | ORAL | 0 refills | Status: DC

## 2019-01-11 NOTE — Progress Notes (Signed)
   Subjective:    Patient ID: Zachary Khan, male    DOB: Jun 19, 1976, 43 y.o.   MRN: [id number]  HPI  VIRTUAL TELEPHONE VISIT  Location of the patient: At home  Notation of patient consent to telephone visit :yes, verbal consent given  Location of the provider:  [facility] office  Name of any referring provider : None  The names of all persons participating in the telemedicine service and their role in the encounter : Daniel Nones and  Kerby Nora, MD [facility]  Time spent on call: 15 minutes   Evaluation and recommendations completed via telephone visit.   43 year old male pt of Zachary Khan presents with 2 day onset of scratchy throat and puffy eyes. Next day started with  fatigue, body aches. Has has congestion. Has baseline cough.. smokers cough.. no change. No fever, no chills. 97.63F Has felt sweaty and clammy. No SOB, no wheeze. No known sick contacts. No travel.  HX  Smoker No COPD, no ASTHMA   Social History /Family History/Past Medical History reviewed in detail and updated in EMR if needed.  Review of Systems  Constitutional: Positive for fatigue. Negative for fever.  HENT: Positive for sore throat. Negative for ear pain and sneezing.   Eyes: Negative for pain.  Respiratory: Negative for shortness of breath and wheezing.   Cardiovascular: Negative for chest pain.  Gastrointestinal: Negative for constipation, diarrhea and nausea.       Objective:   Physical Exam Pulmonary:     Effort: Pulmonary effort is normal. No respiratory distress.  Neurological:     Mental Status: He is alert and oriented to person, place, and time.  Psychiatric:        Thought Content: Thought content normal.        Judgment: Judgment normal.           Assessment & Plan:

## 2019-01-11 NOTE — Assessment & Plan Note (Signed)
Pt requested pain medication for  Pain in wrist keeping him up at night. Aleve, tylenol and diclofenac no relief. MRI abn in past. Referral was pending to specialist, but pt has not heard back.  pt requests  Pain medication.. disucsed after phone visit with PCP.Marland Kitchen she requested further recommendations to come from Dr. Patsy Lager as he is currently managing this patient.   I will CC: Dr. Patsy Lager

## 2019-01-11 NOTE — Progress Notes (Signed)
I spoke with Zachary Khan on the phone and discussed risks and benefits of low dose opioids for scapholunate dissociation.  I think reasonable to use low dose tramadol.  ? Intolerance to vicodin, so I asked him to use 1/4 tablet only initially.

## 2019-01-11 NOTE — Assessment & Plan Note (Signed)
Likely viral illness with mild respiratory symptoms. Recommended symptomatic care.  Rest, fluids. Possible COVID19/ FLU/other viral infection Patient instructed to isolate for 14 days after symptom onset. INfo on care and home isolation given via MyChart.  Note for work faxed to pt job.  Spent 15 min in discussion, assessment and care of patient.

## 2019-01-11 NOTE — Progress Notes (Signed)
Noted. Will call and send him in some tramadol.

## 2019-01-16 ENCOUNTER — Other Ambulatory Visit: Payer: Self-pay | Admitting: Primary Care

## 2019-01-16 DIAGNOSIS — G8929 Other chronic pain: Secondary | ICD-10-CM

## 2019-01-16 DIAGNOSIS — M25539 Pain in unspecified wrist: Principal | ICD-10-CM

## 2019-01-16 NOTE — Telephone Encounter (Signed)
Last prescribed on 12/27/2018 . Last office visit on 01/11/2019 with Dr Ermalene Searing for acute. No future appointment

## 2019-01-16 NOTE — Telephone Encounter (Signed)
Call patient: Does he really need a refil of the diclofenac medication? Looks like Dr. Patsy Lager is giving him Tramadol.

## 2019-01-17 NOTE — Telephone Encounter (Signed)
Spoken and notified patient of Zachary Khan's comments. Patient verbalized understanding.  

## 2019-01-17 NOTE — Telephone Encounter (Signed)
Spoken to patient. He stated that he needs a refill. He stated that diclofenac helps much better for him than the Tramadol. He is taking diclofenac 3 times a day.

## 2019-01-17 NOTE — Telephone Encounter (Signed)
Please notify patient that he should not be exceeding 2 tablets daily as this can cause kidney damage or other problems. Will refill now.

## 2019-01-22 DIAGNOSIS — M654 Radial styloid tenosynovitis [de Quervain]: Secondary | ICD-10-CM | POA: Diagnosis not present

## 2019-01-23 ENCOUNTER — Ambulatory Visit (INDEPENDENT_AMBULATORY_CARE_PROVIDER_SITE_OTHER): Payer: 59 | Admitting: Primary Care

## 2019-01-23 ENCOUNTER — Other Ambulatory Visit: Payer: Self-pay

## 2019-01-23 ENCOUNTER — Encounter: Payer: Self-pay | Admitting: Primary Care

## 2019-01-23 DIAGNOSIS — R6889 Other general symptoms and signs: Secondary | ICD-10-CM | POA: Diagnosis not present

## 2019-01-23 NOTE — Assessment & Plan Note (Signed)
Suspect symptoms to still be viral given low grade fevers and HPI. It's quite possible that he could have Covid-19 but due to lack of testing we cannot confirm. He has been mostly isolating, discussed to avoid public areas at all cost and that he must stay home. I will extend his work note to Wednesday April 8th or sooner if he has been afebrile for 3 days without Tylenol or diclofenac. He verbalized understanding. He will update if symptoms persist/progress.

## 2019-01-23 NOTE — Progress Notes (Signed)
Subjective:    Patient ID: Zachary Khan, male    DOB: 1976/10/17, 43 y.o.   MRN: [id number]  HPI  Virtual Visit via Video Note  I connected with Zachary Khan on 01/23/19 at 11:40 AM EDT by a video enabled telemedicine application and verified that I am speaking with the correct person using two identifiers.   I discussed the limitations of evaluation and management by telemedicine and the availability of in person appointments. The patient expressed understanding and agreed to proceed. He is at home, I am in the office.  History of Present Illness:  Zachary Khan is a 43 year old male with a history of chronic abdominal pain, chronic neck and back pain, chronic wrist pain who presents today via video with a chief complaint of diarrhea.  His diarrhea began two days ago, also with low grade fever with temperature max of 99.9, dry cough, chills, fatigue, body aches. He denies known exposure to Covid-19. He's been at home and has been out of work since March 20th due to flu like symptoms. He was evaluated via phone per Dr. Ermalene Searing with a 2 day history of chills, body aches, scratchy throat, puffy eyes, congestion. His symptoms were suspected to be viral, could not confirm Covid-19 given lack of testing so he was advised to quarantine for two weeks. He was provided with a letter to return to work on April 3rd.  Since that visit he feels as though his symptoms have continued. His last fever was 99.8 at 10:30 this morning. He's been taking Tylenol 1000 mg every 4-6 hours, also taking diclofenac twice daily. Overall his diarrhea has subsided with his last episode being 3 am today and was watery. He is hydrating properly with water, has been out of his home a few times to the drug store. He denies vomiting, shortness of breath. His cough is mild.   Observations/Objective:  Appears fatigued/tired. Mild dry cough during exam, once. Alert and oriented, no distress.  Assessment and Plan:  Suspect  symptoms to still be viral given low grade fevers and HPI. It's quite possible that he could have Covid-19 but due to lack of testing we cannot confirm. He has been mostly isolating, discussed to avoid public areas at all cost and that he must stay home. I will extend his work note to Wednesday April 8th or sooner if he has been afebrile for 3 days without Tylenol or diclofenac. He verbalized understanding. He will update if symptoms persist/progress.   Follow Up Instructions:  Do not exceed 3000 mg of Tylenol in 24 hours. You may continue to take the diclofenac as needed. You must stay home, avoid public spaces until you've been afebrile for three days without the use of fever reducing medication such as Tylenol. I will post another work note to your my chart account. Please notify me if your symptoms get worse.  It was nice to see you! Mayra Reel, NP-C    I discussed the assessment and treatment plan with the patient. The patient was provided an opportunity to ask questions and all were answered. The patient agreed with the plan and demonstrated an understanding of the instructions.   The patient was advised to call back or seek an in-person evaluation if the symptoms worsen or if the condition fails to improve as anticipated.     Doreene Nest, NP    Review of Systems  Constitutional: Positive for chills and fever.  HENT: Positive for congestion. Negative for  ear pain and sore throat.   Respiratory: Positive for cough. Negative for shortness of breath.   Gastrointestinal: Positive for diarrhea. Negative for nausea and vomiting.  Musculoskeletal: Positive for myalgias.       Past Medical History:  Diagnosis Date  . Asthma    AS A CHILD-NO INHALERS  . Cervical spine degeneration   . Chicken pox   . Cholelithiasis and acute cholecystitis without obstruction 12/08/2017  . Chronic back pain   . Frequent headaches   . GERD (gastroesophageal reflux disease)   . Migraine       Social History   Socioeconomic History  . Marital status: Single    Spouse name: Not on file  . Number of children: Not on file  . Years of education: Not on file  . Highest education level: Not on file  Occupational History  . Not on file  Social Needs  . Financial resource strain: Not on file  . Food insecurity:    Worry: Not on file    Inability: Not on file  . Transportation needs:    Medical: Not on file    Non-medical: Not on file  Tobacco Use  . Smoking status: Current Every Day Smoker    Packs/day: 0.25    Years: 20.00    Pack years: 5.00    Types: Cigarettes  . Smokeless tobacco: Never Used  Substance and Sexual Activity  . Alcohol use: Yes    Comment: 2 BEERS ON WEEKEND  . Drug use: Yes    Types: Marijuana    Comment: OCC-NOT EVERYDAY  . Sexual activity: Not on file  Lifestyle  . Physical activity:    Days per week: Not on file    Minutes per session: Not on file  . Stress: Not on file  Relationships  . Social connections:    Talks on phone: Not on file    Gets together: Not on file    Attends religious service: Not on file    Active member of club or organization: Not on file    Attends meetings of clubs or organizations: Not on file    Relationship status: Not on file  . Intimate partner violence:    Fear of current or ex partner: Not on file    Emotionally abused: Not on file    Physically abused: Not on file    Forced sexual activity: Not on file  Other Topics Concern  . Not on file  Social History Narrative   Single.   4 children.   Works as a Retail banker.   Highest level of education: GED.    Past Surgical History:  Procedure Laterality Date  . CHOLECYSTECTOMY N/A 11/28/2017   Procedure: LAPAROSCOPIC CHOLECYSTECTOMY;  Surgeon: Ancil Linsey, MD;  Location: [facility] ORS;  Service: General;  Laterality: N/A;  . NO PAST SURGERIES      Family History  Problem Relation Age of Onset  . Arthritis Mother   . Heart disease Mother    . Hypertension Mother   . Alcohol abuse Father   . ALS Father   . Rheum arthritis Maternal Grandmother     Allergies  Allergen Reactions  . Hydrocodone-Acetaminophen Hives  . Sulfa Antibiotics Hives  . Sulfasalazine Hives  . Vicodin [Hydrocodone-Acetaminophen] Hives    Current Outpatient Medications on File Prior to Visit  Medication Sig Dispense Refill  . amLODipine (NORVASC) 10 MG tablet TAKE 1 TABLET(10 MG) BY MOUTH DAILY FOR BLOOD PRESSURE 90 tablet 3  .  diclofenac (VOLTAREN) 75 MG EC tablet TAKE 1 TABLET(75 MG) BY MOUTH TWICE DAILY AS NEEDED FOR PAIN AND INFLAMMATION 60 tablet 0  . famotidine (PEPCID) 20 MG tablet Take 1 tablet (20 mg total) by mouth 2 (two) times daily. 60 tablet 0  . lisinopril-hydrochlorothiazide (PRINZIDE,ZESTORETIC) 20-25 MG tablet Take 1 tablet by mouth daily. For blood pressure. 90 tablet 3  . predniSONE (DELTASONE) 20 MG tablet 2 tabs po daily for 5 days, then 1 tab po daily for 5 days 15 tablet 0  . SUMAtriptan (IMITREX) 100 MG tablet Take 1 tablet at migraine onset. May repeat in 2 hours if no resolve. Do not exceed 2 tablets in 24 hours. 10 tablet 0  . traMADol (ULTRAM) 50 MG tablet 1 tab po q 6 hours as needed for pain 20 tablet 0  . traZODone (DESYREL) 100 MG tablet Take 1 tablet (100 mg total) by mouth at bedtime. For sleep. 90 tablet 1   No current facility-administered medications on file prior to visit.     There were no vitals taken for this visit.   Objective:   Physical Exam  Constitutional: He is oriented to person, place, and time. He appears well-nourished.  Respiratory: Effort normal. No respiratory distress.  Mild dry cough once during exam.   Neurological: He is alert and oriented to person, place, and time.  Psychiatric: He has a normal mood and affect.           Assessment & Plan:

## 2019-01-23 NOTE — Patient Instructions (Signed)
Do not exceed 3000 mg of Tylenol in 24 hours. You may continue to take the diclofenac as needed. You must stay home, avoid public spaces until you've been afebrile for three days without the use of fever reducing medication such as Tylenol. I will post another work note to your my chart account. Please notify me if your symptoms get worse.  It was nice to see you! Mayra Reel, NP-C

## 2019-01-25 ENCOUNTER — Telehealth: Payer: Self-pay

## 2019-01-25 NOTE — Telephone Encounter (Signed)
[employer] paperwork in [place] in box for review and signature

## 2019-01-25 NOTE — Telephone Encounter (Signed)
Completed and placed on [place] desk. 

## 2019-01-25 NOTE — Telephone Encounter (Signed)
Please contact [employer] STD department with dates patient has been excused from work by PCP.  She can be reached [phone] ext [phone]. Claim [phone]

## 2019-01-28 NOTE — Telephone Encounter (Signed)
Paperwork faxed °

## 2019-01-29 ENCOUNTER — Telehealth: Payer: Self-pay | Admitting: Primary Care

## 2019-01-29 NOTE — Telephone Encounter (Signed)
Pt aware Copy for pt Copy for scann

## 2019-01-29 NOTE — Telephone Encounter (Signed)
error 

## 2019-01-31 ENCOUNTER — Ambulatory Visit (INDEPENDENT_AMBULATORY_CARE_PROVIDER_SITE_OTHER): Payer: 59 | Admitting: Family Medicine

## 2019-01-31 ENCOUNTER — Telehealth: Payer: Self-pay

## 2019-01-31 ENCOUNTER — Encounter: Payer: Self-pay | Admitting: Family Medicine

## 2019-01-31 VITALS — Temp 99.9°F | Ht 66.5 in

## 2019-01-31 DIAGNOSIS — R110 Nausea: Secondary | ICD-10-CM

## 2019-01-31 DIAGNOSIS — R05 Cough: Secondary | ICD-10-CM | POA: Diagnosis not present

## 2019-01-31 DIAGNOSIS — R5081 Fever presenting with conditions classified elsewhere: Secondary | ICD-10-CM | POA: Diagnosis not present

## 2019-01-31 DIAGNOSIS — R059 Cough, unspecified: Secondary | ICD-10-CM

## 2019-01-31 DIAGNOSIS — R6889 Other general symptoms and signs: Secondary | ICD-10-CM

## 2019-01-31 DIAGNOSIS — R197 Diarrhea, unspecified: Secondary | ICD-10-CM

## 2019-01-31 DIAGNOSIS — F17200 Nicotine dependence, unspecified, uncomplicated: Secondary | ICD-10-CM

## 2019-01-31 MED ORDER — ALBUTEROL SULFATE HFA 108 (90 BASE) MCG/ACT IN AERS: 2.0000 | INHALATION_SPRAY | Freq: Four times a day (QID) | RESPIRATORY_TRACT | 1 refills | Status: DC | PRN

## 2019-01-31 MED ORDER — CEFDINIR 300 MG PO CAPS: 600.0000 mg | ORAL_CAPSULE | Freq: Every day | ORAL | 0 refills | Status: AC

## 2019-01-31 NOTE — Telephone Encounter (Signed)
Received message from patient via MyChart.   "Jae Dire I'm still have a low grade fever. Its hanging around 99.9. Still having diarrhea, chills, and my mouth and throat have been constantly dry and feeling thirsty all the time the last two days."  Virtual video appointment scheduled with Dr. Patsy Lager on 01/31/19 @ 1440.   Note: E-visit with PCP on 01/23/19.

## 2019-01-31 NOTE — Progress Notes (Signed)
Zachary Mostafa T. Pace Lamadrid, MD Primary Care and Sports Medicine [facility], [place] at [facility] [address] [place], [address] Phone: [phone]  FAX: [phone]  Zachary Khan - 43 y.o. male  MRN [id number]  Date of Birth: 03-24-1976  Visit Date: 01/31/2019  PCP: Doreene Nest, NP  Referred by: Doreene Nest, NP  Chief Complaint  Patient presents with  . Cough  . Chills  . Fever    Been under quarentine x 3 weeks  . Diarrhea  . Emesis  . Dry mouth    Virtual Visit via Video Note:  I connected with  Zachary Khan on 01/31/2019  2:40 PM EDT by a video enabled telemedicine application and verified that I am speaking with the correct person using two identifiers.   Location patient: home computer, tablet, or smartphone Location provider: work or home office Consent: Verbal consent directly obtained from [employer]. Persons participating in the virtual visit: patient, provider  I discussed the limitations of evaluation and management by telemedicine and the availability of in person appointments. The patient expressed understanding and agreed to proceed.  History of Present Illness:  He is been under quarantine for about 3 weeks.  He has continued to have fever of approximately 100.  He is having chills, coughing, nausea that is most of the time, diarrhea, he is thrown up a few times, he is having dry mouth.  He is able to drink and eat some, but he generally still feels pretty bad.  His fever is never broken.  He does feel like he is wheezing some and he is a smoker.  He is only smoking 2 or 3 cigarettes a day now per report.  Review of Systems as above: See pertinent positives and pertinent negatives per HPI No acute distress verbally  Past Medical History, Surgical History, Social History, Family History, Problem List, Medications, and Allergies have been reviewed and updated if relevant.   Observations/Objective/Exam:  An attempt was  made to discern vital signs over the phone and per patient if applicable and possible.   General:    Alert, Oriented, appears well and in no acute distress HEENT:     Atraumatic, conjunctiva clear, no obvious abnormalities on inspection of external nose and ears.  Neck:    Normal movements of the head and neck Pulmonary:     On inspection no signs of respiratory distress, breathing rate appears normal, no obvious gross SOB, gasping or wheezing Cardiovascular:    No obvious cyanosis Musculoskeletal:    Moves all visible extremities without noticeable abnormality Psych / Neurological:     Pleasant and cooperative, no obvious depression or anxiety, speech and thought processing grossly intact  Assessment and Plan:  Flu-like symptoms  Cough  Fever in other diseases  Diarrhea, unspecified type  Nausea  Smoker  >25 minutes spent in face to face time with patient, >50% spent in counselling or coordination of care   I explained to him that this is an extremely difficult situation to evaluate somebody without the physical exam and in office lab testing and x-ray.  3 weeks of symptoms, given clinical history, this would less likely be influenza.  Certainly can follow-up pattern of COVID-19.  Certainly could have had influenza and now have a secondary infection or pneumonia.  None of this can be fully excluded.  I am getting give him An inhaler to use.  He also has some Zofran at home, he is going  to use this for as needed nausea. given the length of symptoms, think that it is reasonable for him to use some oral antibiotics in the case that this is a secondary pneumonia or infection.  Insert text  I discussed the assessment and treatment plan with the patient. The patient was provided an opportunity to ask questions and all were answered. The patient agreed with the plan and demonstrated an understanding of the instructions.   The patient was advised to call back or seek an in-person  evaluation if the symptoms worsen or if the condition fails to improve as anticipated.  Follow-up: prn unless noted otherwise below No follow-ups on file.  Meds ordered this encounter  Medications  . cefdinir (OMNICEF) 300 MG capsule    Sig: Take 2 capsules (600 mg total) by mouth daily for 10 days.    Dispense:  20 capsule    Refill:  0  . albuterol (PROVENTIL HFA;VENTOLIN HFA) 108 (90 Base) MCG/ACT inhaler    Sig: Inhale 2 puffs into the lungs every 6 (six) hours as needed for wheezing or shortness of breath.    Dispense:  1 Inhaler    Refill:  1   No orders of the defined types were placed in this encounter.   Signed,  Elpidio GaleaSpencer T. Akshay Spang, MD

## 2019-02-05 ENCOUNTER — Telehealth: Payer: Self-pay

## 2019-02-05 NOTE — Telephone Encounter (Signed)
Noted and appreciate the follow up. 

## 2019-02-05 NOTE — Telephone Encounter (Signed)
Spoke with patient to follow up on his condition after his evisit with Dr. Patsy Lager on 01/31/19 for flu/covid suspicious symptoms.  Patient never went to pick up his inhaler or his antibiotic but states he plans on going today to get them.  He reports improvement in his body aches and diarrhea but does continue to have cough that is prod with clear sputum. He denies any SOB, or chest discomfort and no new symptoms.    Overall he states some improvement but will start his medications and I will make another care follow up call on patient by the end of the week.  Patient thanks me for the check and will call back if any concerns in the meantime.

## 2019-02-08 NOTE — Telephone Encounter (Signed)
Left detailed message asking patient to please call us first of next week with an update on his condition.   I did inform him that if he is experiencing any concerning decline then he should call our on call service to discuss with them before Monday.

## 2019-02-11 NOTE — Telephone Encounter (Signed)
Noted. Will forward to [facility] as FYI.

## 2019-02-11 NOTE — Telephone Encounter (Signed)
Patient returned Mandy's call.  Patient said he's feeling better since taking the antibiotics. No new symptoms.  Patient's temperature is 99.9.  Patient can be reached at [phone].

## 2019-02-13 ENCOUNTER — Other Ambulatory Visit: Payer: Self-pay | Admitting: Primary Care

## 2019-02-13 DIAGNOSIS — M25539 Pain in unspecified wrist: Principal | ICD-10-CM

## 2019-02-13 DIAGNOSIS — G8929 Other chronic pain: Secondary | ICD-10-CM

## 2019-02-13 NOTE — Telephone Encounter (Signed)
Last filled #[id number] on 01/17/19.  Patient was advised at that time that he should only be taking twice daily due to organ damage as patient stated that he was originally taking 3 x daily.  Patient verbalized understanding at that time.  Rx request refill on this prescription is a little early if patient was taking as directed.  Will pend for Allayne Gitelman, NP's approval.

## 2019-02-13 NOTE — Telephone Encounter (Signed)
Noted, refill sent to pharmacy. 

## 2019-02-22 ENCOUNTER — Encounter: Payer: Self-pay | Admitting: Primary Care

## 2019-02-22 ENCOUNTER — Ambulatory Visit (INDEPENDENT_AMBULATORY_CARE_PROVIDER_SITE_OTHER): Payer: 59 | Admitting: Primary Care

## 2019-02-22 DIAGNOSIS — K219 Gastro-esophageal reflux disease without esophagitis: Secondary | ICD-10-CM | POA: Diagnosis not present

## 2019-02-22 DIAGNOSIS — J4530 Mild persistent asthma, uncomplicated: Secondary | ICD-10-CM | POA: Diagnosis not present

## 2019-02-22 DIAGNOSIS — J45909 Unspecified asthma, uncomplicated: Secondary | ICD-10-CM | POA: Insufficient documentation

## 2019-02-22 MED ORDER — FLUTICASONE PROPIONATE HFA 110 MCG/ACT IN AERO: 1.0000 | INHALATION_SPRAY | Freq: Two times a day (BID) | RESPIRATORY_TRACT | 0 refills | Status: DC

## 2019-02-22 NOTE — Assessment & Plan Note (Signed)
Doing well on Pepcid 20 mg BID, denies symptoms of GERD. Continue same.

## 2019-02-22 NOTE — Progress Notes (Signed)
Subjective:    Patient ID: Zachary Khan, male    DOB: 1976-10-23, 43 y.o.   MRN: [id number]  HPI  Virtual Visit via Video Note  I connected with Zachary Khan on 02/22/19 at 10:40 AM EDT by a video enabled telemedicine application and verified that I am speaking with the correct person using two identifiers.  Location: Patient: Home Provider: Office   I discussed the limitations of evaluation and management by telemedicine and the availability of in person appointments. The patient expressed understanding and agreed to proceed.  History of Present Illness:  Zachary Khan is a 43 year old male with a history of hypertension, childhood asthma, chronic pain, insomnia who presents today with a chief complaint of cough.  He was last evaluated for this on March 20th by Dr. Ermalene Searing, January 23, 2019 by myself, and then again on April 9th per Dr. Patsy Lager. During his visits on March 20th and April 1st his symptoms were presumed to be secondary to viral cause, could not rule out Covid-19 due to lack of testing capability. He was encouraged to quarantine and treat symptoms with conservative measures. During his visit on April 9th he was provided with an albuterol inhaler and cefdinir for potential viral illness turned pneumonia. He was called on 02/05/19 with reports of improved symptoms.  Since then he's noticed cough and wheezing, his other symptoms have resolved and he's feeling well overall. He is using the albuterol inhaler, 2 puffs daily everyday since his visit with Dr. Patsy Lager. He hasn't found any improvement from albuterol. His cough is present daily and is throughout the day everyday and has been present now for 6 weeks.He is smoking 5 cigarettes daily. He is taking famotidine 20 mg BID and feels well managed. He very seldom has allergy symptoms and doesn't take anything OTC. He has been afebrile for three consecutive days and is ready to return to work. He is needing a work note.     Observations/Objective:  Alert and oriented. Appears well, not sickly. No distress. Speaking in complete sentences. Dry cough during exam.  Assessment and Plan:  See problem based charting.  Follow Up Instructions:  Stop using the albuterol inhaler.  Start fluticasone (Flovent) inhaler. Inhale 1 puff into the lungs twice daily, everyday.   Please update me in regards to your cough in 2 weeks.   It was a pleasure to see you today! Mayra Reel, NP-C    I discussed the assessment and treatment plan with the patient. The patient was provided an opportunity to ask questions and all were answered. The patient agreed with the plan and demonstrated an understanding of the instructions.   The patient was advised to call back or seek an in-person evaluation if the symptoms worsen or if the condition fails to improve as anticipated.     Doreene Nest, NP    Review of Systems  Constitutional: Negative for fever.  HENT: Negative for congestion.   Respiratory: Positive for cough and wheezing. Negative for shortness of breath.   Gastrointestinal:       Denies GERD symptoms  Allergic/Immunologic: Negative for environmental allergies.       Past Medical History:  Diagnosis Date  . Asthma    AS A CHILD-NO INHALERS  . Cervical spine degeneration   . Chicken pox   . Cholelithiasis and acute cholecystitis without obstruction 12/08/2017  . Chronic back pain   . Frequent headaches   . GERD (gastroesophageal reflux disease)   .  Migraine      Social History   Socioeconomic History  . Marital status: Single    Spouse name: Not on file  . Number of children: Not on file  . Years of education: Not on file  . Highest education level: Not on file  Occupational History  . Not on file  Social Needs  . Financial resource strain: Not on file  . Food insecurity:    Worry: Not on file    Inability: Not on file  . Transportation needs:    Medical: Not on file    Non-medical:  Not on file  Tobacco Use  . Smoking status: Current Every Day Smoker    Packs/day: 0.25    Years: 20.00    Pack years: 5.00    Types: Cigarettes  . Smokeless tobacco: Never Used  Substance and Sexual Activity  . Alcohol use: Yes    Comment: 2 BEERS ON WEEKEND  . Drug use: Yes    Types: Marijuana    Comment: OCC-NOT EVERYDAY  . Sexual activity: Not on file  Lifestyle  . Physical activity:    Days per week: Not on file    Minutes per session: Not on file  . Stress: Not on file  Relationships  . Social connections:    Talks on phone: Not on file    Gets together: Not on file    Attends religious service: Not on file    Active member of club or organization: Not on file    Attends meetings of clubs or organizations: Not on file    Relationship status: Not on file  . Intimate partner violence:    Fear of current or ex partner: Not on file    Emotionally abused: Not on file    Physically abused: Not on file    Forced sexual activity: Not on file  Other Topics Concern  . Not on file  Social History Narrative   Single.   4 children.   Works as a Retail banker.   Highest level of education: GED.    Past Surgical History:  Procedure Laterality Date  . CHOLECYSTECTOMY N/A 11/28/2017   Procedure: LAPAROSCOPIC CHOLECYSTECTOMY;  Surgeon: Ancil Linsey, MD;  Location: [facility] ORS;  Service: General;  Laterality: N/A;  . NO PAST SURGERIES      Family History  Problem Relation Age of Onset  . Arthritis Mother   . Heart disease Mother   . Hypertension Mother   . Alcohol abuse Father   . ALS Father   . Rheum arthritis Maternal Grandmother     Allergies  Allergen Reactions  . Hydrocodone-Acetaminophen Hives  . Sulfa Antibiotics Hives  . Sulfasalazine Hives  . Vicodin [Hydrocodone-Acetaminophen] Hives    Current Outpatient Medications on File Prior to Visit  Medication Sig Dispense Refill  . albuterol (PROVENTIL HFA;VENTOLIN HFA) 108 (90 Base) MCG/ACT inhaler  Inhale 2 puffs into the lungs every 6 (six) hours as needed for wheezing or shortness of breath. 1 Inhaler 1  . amLODipine (NORVASC) 10 MG tablet TAKE 1 TABLET(10 MG) BY MOUTH DAILY FOR BLOOD PRESSURE 90 tablet 3  . diclofenac (VOLTAREN) 75 MG EC tablet TAKE 1 TABLET(75 MG) BY MOUTH TWICE DAILY AS NEEDED FOR PAIN AND INFLAMMATION 180 tablet 0  . famotidine (PEPCID) 20 MG tablet Take 1 tablet (20 mg total) by mouth 2 (two) times daily. 60 tablet 0  . lisinopril-hydrochlorothiazide (PRINZIDE,ZESTORETIC) 20-25 MG tablet Take 1 tablet by mouth daily. For blood pressure. 90  tablet 3  . SUMAtriptan (IMITREX) 100 MG tablet Take 1 tablet at migraine onset. May repeat in 2 hours if no resolve. Do not exceed 2 tablets in 24 hours. 10 tablet 0  . traZODone (DESYREL) 100 MG tablet Take 1 tablet (100 mg total) by mouth at bedtime. For sleep. 90 tablet 1   No current facility-administered medications on file prior to visit.     There were no vitals taken for this visit.   Objective:   Physical Exam  Constitutional: He is oriented to person, place, and time. He appears well-nourished. He does not have a sickly appearance. He does not appear ill.  Respiratory: Effort normal. No respiratory distress.  Dry cough during exam  Neurological: He is alert and oriented to person, place, and time.  Psychiatric: He has a normal mood and affect.           Assessment & Plan:

## 2019-02-22 NOTE — Assessment & Plan Note (Signed)
Diagnosed in childhood originally, no problems since the age of 65. Recent viral/potential bacterial infection and likely has aggravated asthma from recent infection. He has no GERD symptoms on famotidine, denies allergy symptoms. No evidence of COPD on most recent chest xray.  Stop albuterol. Start Flovent 110 mcg BID. He will update in 2 weeks.

## 2019-02-22 NOTE — Patient Instructions (Signed)
Stop using the albuterol inhaler.  Start fluticasone (Flovent) inhaler. Inhale 1 puff into the lungs twice daily, everyday.   Please update me in regards to your cough in 2 weeks.   It was a pleasure to see you today! Mayra Reel, NP-C

## 2019-03-04 DIAGNOSIS — S52124D Nondisplaced fracture of head of right radius, subsequent encounter for closed fracture with routine healing: Secondary | ICD-10-CM | POA: Diagnosis not present

## 2019-03-05 DIAGNOSIS — S52124D Nondisplaced fracture of head of right radius, subsequent encounter for closed fracture with routine healing: Secondary | ICD-10-CM | POA: Diagnosis not present

## 2019-03-14 ENCOUNTER — Other Ambulatory Visit: Payer: Self-pay | Admitting: Primary Care

## 2019-03-14 ENCOUNTER — Other Ambulatory Visit: Payer: Self-pay

## 2019-03-14 DIAGNOSIS — S52124D Nondisplaced fracture of head of right radius, subsequent encounter for closed fracture with routine healing: Secondary | ICD-10-CM | POA: Diagnosis not present

## 2019-03-14 DIAGNOSIS — J4530 Mild persistent asthma, uncomplicated: Secondary | ICD-10-CM

## 2019-03-15 MED ORDER — FLUTICASONE PROPIONATE HFA 110 MCG/ACT IN AERO: 1.0000 | INHALATION_SPRAY | Freq: Two times a day (BID) | RESPIRATORY_TRACT | 0 refills | Status: DC

## 2019-03-22 ENCOUNTER — Other Ambulatory Visit: Payer: Self-pay | Admitting: Primary Care

## 2019-03-22 DIAGNOSIS — G4700 Insomnia, unspecified: Secondary | ICD-10-CM

## 2019-03-24 ENCOUNTER — Other Ambulatory Visit: Payer: Self-pay | Admitting: Family Medicine

## 2019-04-09 IMAGING — CR DG CHEST 2V
2 series · 2 of 2 positions shown · non-contrast
Comparison: April 18, 2017

CLINICAL DATA: Shortness of breath and chest pain

EXAM:
CHEST  2 VIEW

[chest pa]
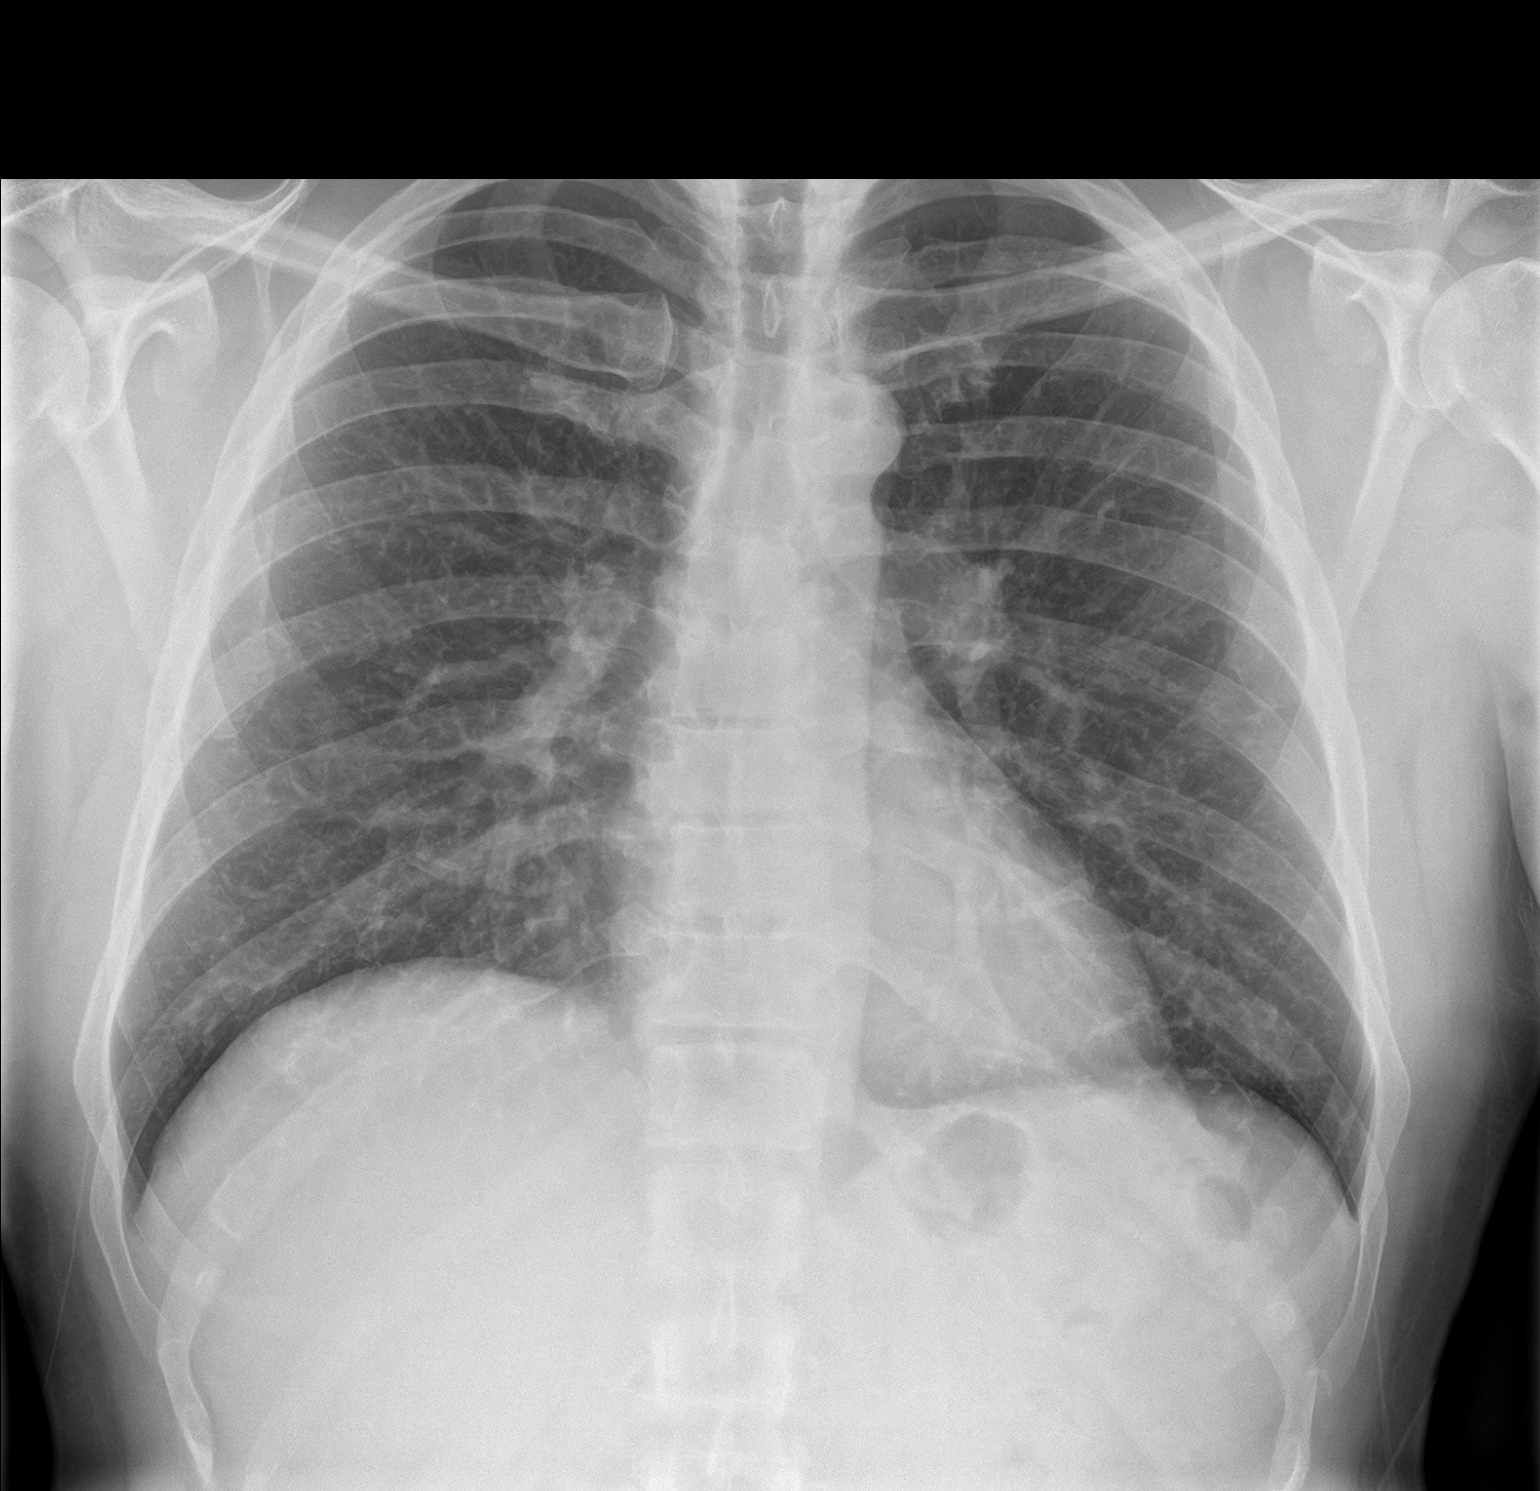

[chest lat]
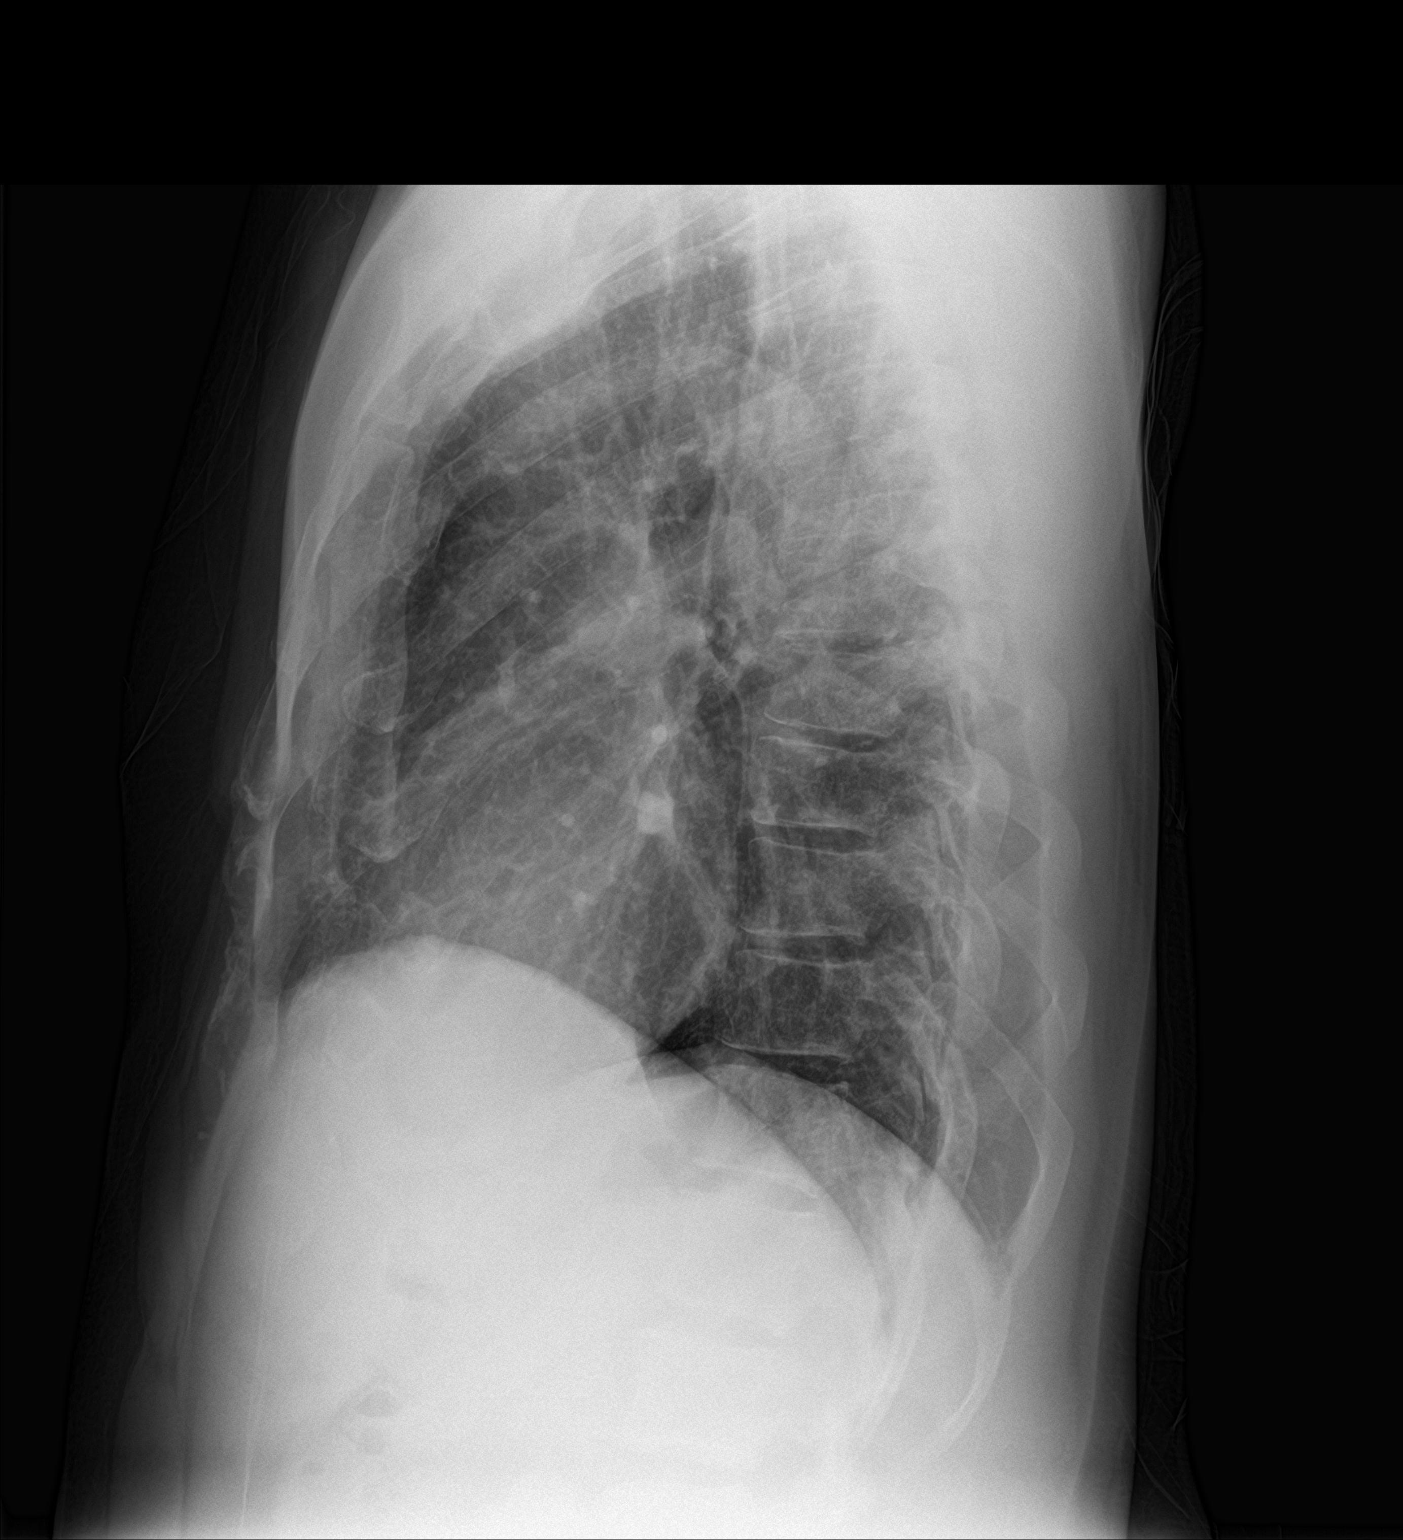

[2 of 2 positions shown; findings below may reference images not displayed]

FINDINGS: There is no edema or consolidation. The heart size and pulmonary
vascularity are normal. No adenopathy. No pneumothorax. No bone
lesions.
IMPRESSION: No edema or consolidation.

## 2019-04-11 ENCOUNTER — Ambulatory Visit (INDEPENDENT_AMBULATORY_CARE_PROVIDER_SITE_OTHER): Payer: 59 | Admitting: Primary Care

## 2019-04-11 ENCOUNTER — Encounter: Payer: Self-pay | Admitting: Primary Care

## 2019-04-11 ENCOUNTER — Other Ambulatory Visit: Payer: Self-pay

## 2019-04-11 DIAGNOSIS — R0602 Shortness of breath: Secondary | ICD-10-CM | POA: Diagnosis not present

## 2019-04-11 DIAGNOSIS — Z20822 Contact with and (suspected) exposure to covid-19: Secondary | ICD-10-CM | POA: Insufficient documentation

## 2019-04-11 DIAGNOSIS — Z20828 Contact with and (suspected) exposure to other viral communicable diseases: Secondary | ICD-10-CM

## 2019-04-11 DIAGNOSIS — R197 Diarrhea, unspecified: Secondary | ICD-10-CM

## 2019-04-11 DIAGNOSIS — R5383 Other fatigue: Secondary | ICD-10-CM

## 2019-04-11 DIAGNOSIS — R509 Fever, unspecified: Secondary | ICD-10-CM | POA: Diagnosis not present

## 2019-04-11 NOTE — Assessment & Plan Note (Signed)
Symptoms and presentation suspicious for Covid-19. He does not appear in distress and does appear stable for outpatient treatment.  We will have him tested for Covid-19. Discussed quarantine precautions. Await results. Work note provided.

## 2019-04-11 NOTE — Patient Instructions (Signed)
You must stay home (except for testing) until your symptoms have improved, you are 7 days past symptoms onset, and you've been without a fever for 3 days without the use of Tylenol.  We will be in touch soon for testing.  It was a pleasure to see you today!

## 2019-04-11 NOTE — Progress Notes (Signed)
Subjective:    Patient ID: Zachary Khan, male    DOB: 11-Oct-1976, 43 y.o.   MRN: [id number]  HPI  Virtual Visit via Video Note  I connected with Zachary Khan on 04/11/19 at  8:00 AM EDT by a video enabled telemedicine application and verified that I am speaking with the correct person using two identifiers.  Location: Patient: Home Provider: Office   I discussed the limitations of evaluation and management by telemedicine and the availability of in person appointments. The patient expressed understanding and agreed to proceed.  History of Present Illness:  Zachary Khan is a 43 year old male with a history of asthma, GERD, hypertension, migraines who presents today with reports of Covid exposure.   He was attending a funeral last week in [place] and found out that several people attending tested positive for Covid 19.   Over the last 2-3 days he's developed symptoms of cough, low grade fever, diarrhea, fatigue, slight shortness of breath. He's been taking Tylenol at home for fever and body aches, also using the albuterol inhaler. He has been out of work all week and is needing a note. He has not yet been tested.   Observations/Objective:  Alert and oriented. Appears well, not sickly. No distress. Speaking in complete sentences.   Assessment and Plan:  Symptoms and presentation suspicious for Covid-19. He does not appear in distress and does appear stable for outpatient treatment.  We will have him tested for Covid-19. Discussed quarantine precautions. Await results. Work note provided.   Follow Up Instructions:  You must stay home (except for testing) until your symptoms have improved, you are 7 days past symptoms onset, and you've been without a fever for 3 days without the use of Tylenol.  We will be in touch soon for testing.  It was a pleasure to see you today!    I discussed the assessment and treatment plan with the patient. The patient was provided an  opportunity to ask questions and all were answered. The patient agreed with the plan and demonstrated an understanding of the instructions.   The patient was advised to call back or seek an in-person evaluation if the symptoms worsen or if the condition fails to improve as anticipated.     Zachary Koch, NP    Review of Systems  Constitutional: Positive for chills, fatigue and fever.  Respiratory: Positive for cough and shortness of breath.   Cardiovascular: Negative for chest pain.  Gastrointestinal: Positive for diarrhea.  Allergic/Immunologic: Positive for environmental allergies.       Past Medical History:  Diagnosis Date  . Asthma    AS A CHILD-NO INHALERS  . Cervical spine degeneration   . Chicken pox   . Cholelithiasis and acute cholecystitis without obstruction 12/08/2017  . Chronic back pain   . Frequent headaches   . GERD (gastroesophageal reflux disease)   . Migraine      Social History   Socioeconomic History  . Marital status: Single    Spouse name: Not on file  . Number of children: Not on file  . Years of education: Not on file  . Highest education level: Not on file  Occupational History  . Not on file  Social Needs  . Financial resource strain: Not on file  . Food insecurity    Worry: Not on file    Inability: Not on file  . Transportation needs    Medical: Not on file    Non-medical: Not  on file  Tobacco Use  . Smoking status: Current Every Day Smoker    Packs/day: 0.25    Years: 20.00    Pack years: 5.00    Types: Cigarettes  . Smokeless tobacco: Never Used  Substance and Sexual Activity  . Alcohol use: Yes    Comment: 2 BEERS ON WEEKEND  . Drug use: Yes    Types: Marijuana    Comment: OCC-NOT EVERYDAY  . Sexual activity: Not on file  Lifestyle  . Physical activity    Days per week: Not on file    Minutes per session: Not on file  . Stress: Not on file  Relationships  . Social Musicianconnections    Talks on phone: Not on file     Gets together: Not on file    Attends religious service: Not on file    Active member of club or organization: Not on file    Attends meetings of clubs or organizations: Not on file    Relationship status: Not on file  . Intimate partner violence    Fear of current or ex partner: Not on file    Emotionally abused: Not on file    Physically abused: Not on file    Forced sexual activity: Not on file  Other Topics Concern  . Not on file  Social History Narrative   Single.   4 children.   Works as a Retail bankerservice technician.   Highest level of education: GED.    Past Surgical History:  Procedure Laterality Date  . CHOLECYSTECTOMY N/A 11/28/2017   Procedure: LAPAROSCOPIC CHOLECYSTECTOMY;  Surgeon: Ancil Linseyavis, Jason Evan, MD;  Location: [facility] ORS;  Service: General;  Laterality: N/A;  . NO PAST SURGERIES      Family History  Problem Relation Age of Onset  . Arthritis Mother   . Heart disease Mother   . Hypertension Mother   . Alcohol abuse Father   . ALS Father   . Rheum arthritis Maternal Grandmother     Allergies  Allergen Reactions  . Hydrocodone-Acetaminophen Hives  . Sulfa Antibiotics Hives  . Sulfasalazine Hives  . Vicodin [Hydrocodone-Acetaminophen] Hives    Current Outpatient Medications on File Prior to Visit  Medication Sig Dispense Refill  . amLODipine (NORVASC) 10 MG tablet TAKE 1 TABLET(10 MG) BY MOUTH DAILY FOR BLOOD PRESSURE 90 tablet 3  . diclofenac (VOLTAREN) 75 MG EC tablet TAKE 1 TABLET(75 MG) BY MOUTH TWICE DAILY AS NEEDED FOR PAIN AND INFLAMMATION 180 tablet 0  . famotidine (PEPCID) 20 MG tablet Take 1 tablet (20 mg total) by mouth 2 (two) times daily. 60 tablet 0  . fluticasone (FLOVENT HFA) 110 MCG/ACT inhaler Inhale 1 puff into the lungs 2 (two) times a day. 3 Inhaler 0  . lisinopril-hydrochlorothiazide (PRINZIDE,ZESTORETIC) 20-25 MG tablet Take 1 tablet by mouth daily. For blood pressure. 90 tablet 3  . SUMAtriptan (IMITREX) 100 MG tablet Take 1 tablet at  migraine onset. May repeat in 2 hours if no resolve. Do not exceed 2 tablets in 24 hours. 10 tablet 0  . traZODone (DESYREL) 100 MG tablet TAKE 1 TABLET(100 MG) BY MOUTH AT BEDTIME FOR SLEEP 90 tablet 1  . VENTOLIN HFA 108 (90 Base) MCG/ACT inhaler INHALE 2 PUFFS INTO THE LUNGS EVERY 6 HOURS AS NEEDED FOR WHEEZING OR SHORTNESS OF BREATH 18 g 0   No current facility-administered medications on file prior to visit.     Temp 99 F (37.2 C)   Wt 183 lb (83 kg)  BMI 29.09 kg/m    Objective:   Physical Exam  Constitutional: He is oriented to person, place, and time. He appears well-nourished.  Appears fatigued  Respiratory: Effort normal. No respiratory distress.  Neurological: He is alert and oriented to person, place, and time.  Psychiatric: He has a normal mood and affect.           Assessment & Plan:

## 2019-04-12 ENCOUNTER — Emergency Department
Admission: EM | Admit: 2019-04-12 | Discharge: 2019-04-12 | Disposition: A | Discharge status: Home / Self Care | Payer: 59 | Attending: Emergency Medicine | Admitting: Emergency Medicine

## 2019-04-12 ENCOUNTER — Emergency Department: Payer: 59

## 2019-04-12 ENCOUNTER — Other Ambulatory Visit: Payer: Self-pay

## 2019-04-12 ENCOUNTER — Ambulatory Visit: Payer: Self-pay

## 2019-04-12 ENCOUNTER — Encounter: Payer: Self-pay | Admitting: Emergency Medicine

## 2019-04-12 DIAGNOSIS — Z79899 Other long term (current) drug therapy: Secondary | ICD-10-CM | POA: Insufficient documentation

## 2019-04-12 DIAGNOSIS — R197 Diarrhea, unspecified: Secondary | ICD-10-CM | POA: Insufficient documentation

## 2019-04-12 DIAGNOSIS — B349 Viral infection, unspecified: Secondary | ICD-10-CM | POA: Diagnosis not present

## 2019-04-12 DIAGNOSIS — Z20828 Contact with and (suspected) exposure to other viral communicable diseases: Secondary | ICD-10-CM | POA: Diagnosis not present

## 2019-04-12 DIAGNOSIS — I10 Essential (primary) hypertension: Secondary | ICD-10-CM | POA: Insufficient documentation

## 2019-04-12 DIAGNOSIS — R5383 Other fatigue: Secondary | ICD-10-CM

## 2019-04-12 DIAGNOSIS — F17210 Nicotine dependence, cigarettes, uncomplicated: Secondary | ICD-10-CM | POA: Insufficient documentation

## 2019-04-12 DIAGNOSIS — R531 Weakness: Secondary | ICD-10-CM | POA: Diagnosis present

## 2019-04-12 LAB — SARS CORONAVIRUS 2 BY RT PCR (HOSPITAL ORDER, PERFORMED IN ~~LOC~~ HOSPITAL LAB): SARS Coronavirus 2: NEGATIVE

## 2019-04-12 IMAGING — CR DG CHEST 2V
1 series · 2 of 2 positions shown · non-contrast
Comparison: November 10, 2017

CLINICAL DATA: Left chest pain

EXAM:
CHEST  2 VIEW

[Series 1: dg chest 2 view · 0.14mm/px · 2 of 2 slices shown]
[im 1/2]
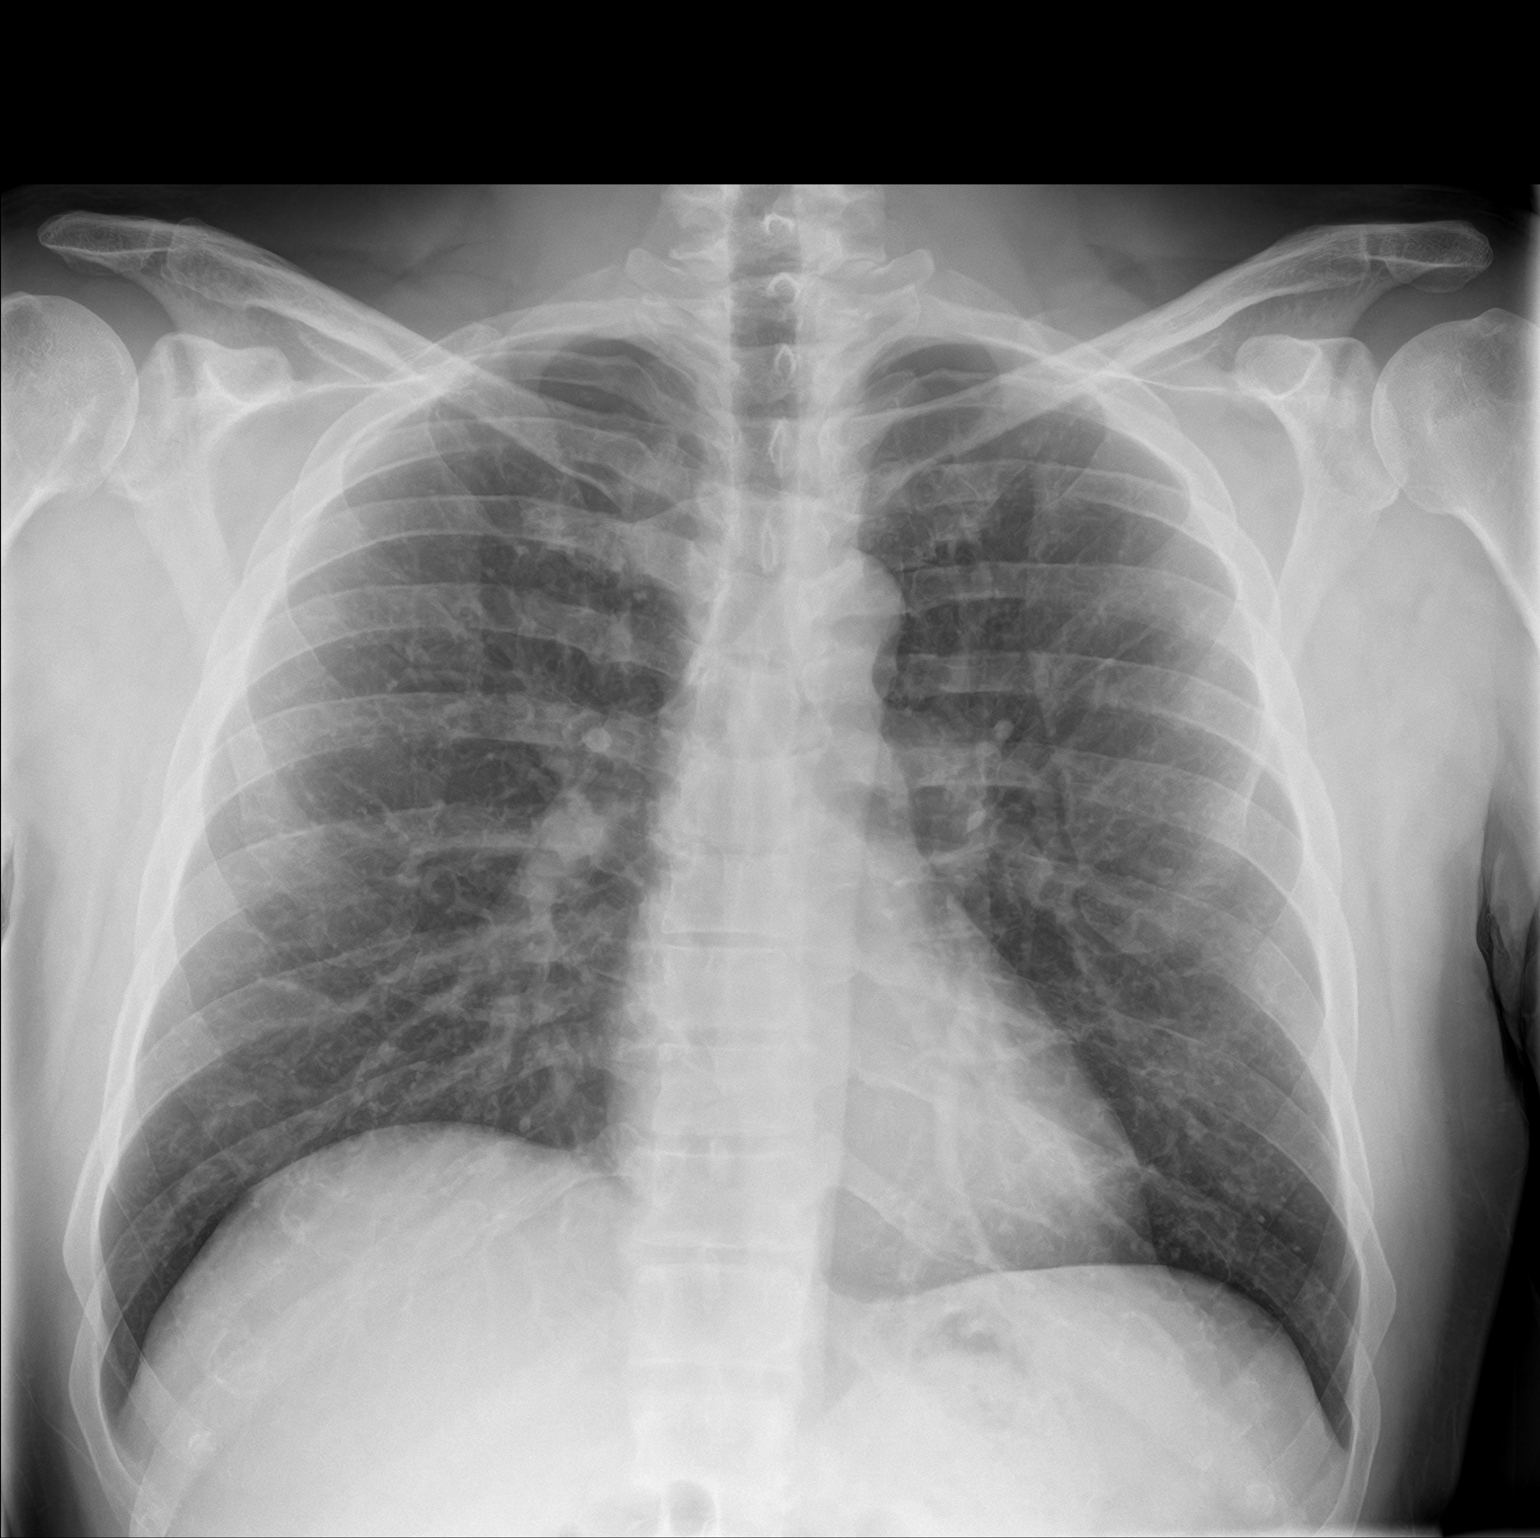
[im 2/2]
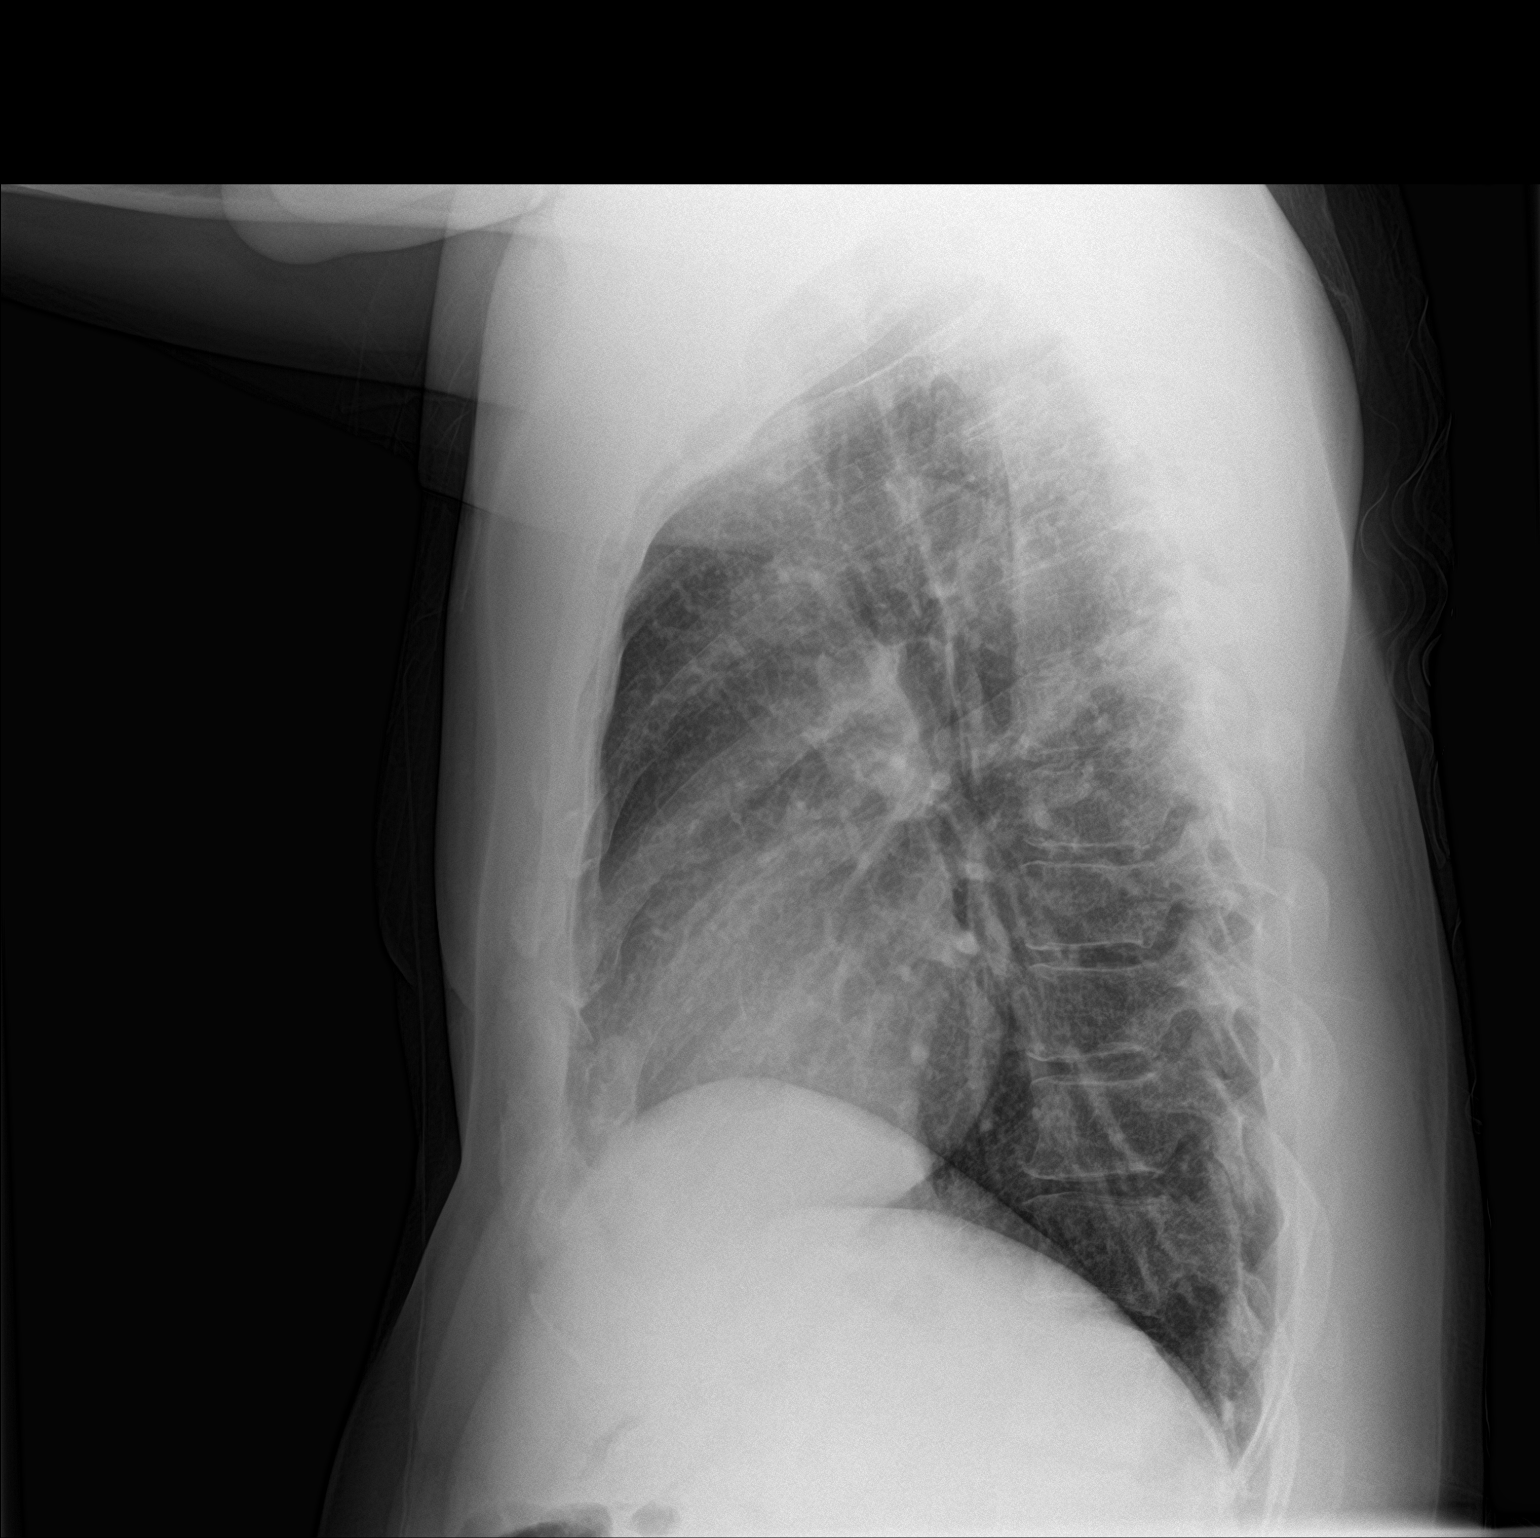

[2 of 2 positions shown; findings below may reference images not displayed]

FINDINGS: The heart size and mediastinal contours are within normal limits.
Both lungs are clear. The visualized skeletal structures are
unremarkable.
IMPRESSION: No active cardiopulmonary disease.

## 2019-04-12 NOTE — Discharge Instructions (Signed)
Follow-up with your primary care provider on Monday if not improving.  Increase clear fluids.  This will cause you to urinate more frequently but should stop the diarrhea.  Chest x-ray and COVID test were negative.  You may continue taking Tylenol or ibuprofen if needed for muscle aches.

## 2019-04-12 NOTE — ED Triage Notes (Signed)
Says he was exposed to covid.  Was supposed to be scheduled for test, but his doctor has not called him back.  Has multiplple symptosme.  Headache , weakness, low grade fever.   Some diarrhea.  Muscle pain, sob.

## 2019-04-12 NOTE — Telephone Encounter (Signed)
Incoming call from Pt. Wife to be  [facility] reporting Patient  Temperature is constantly decreasing.  Has dropped down to 96.3.  Very lethargic.  Decrease in appetite.   Reviewed protocol.  Provided Care advice.  Recommended that Patient go to Urgent care or Ed for further evaluation.    Reason for Disposition . Nursing judgment, per information in Reference  Answer Assessment - Initial Assessment Questions 1. REASON FOR CALL: "What is your main concern right now?"     Temperature decreasing  2. ONSET: "When did the *No Answer* start?"    Tuesday 3. SEVERITY: "How bad is the *No Answer*?"     *No Answer* 4. FEVER: "Do you have a fever?"     Denies HER SYMPTOMS: "Do you have any other new symptoms?"     *lethargic 6. INTERVENTIONS AND RESPONSE: "What have you done so far to try to make this better? What medications have you used?"      7. PREGNANCY: "Is there any chance you are pregnant?"     na  Protocols used: NO GUIDELINE AVAILABLE-A-AH

## 2019-04-12 NOTE — Telephone Encounter (Signed)
Noted and agree with recommendation

## 2019-04-12 NOTE — ED Notes (Addendum)
Pt reports feeling lethargic, muscle aches, "low grade temp" and reports a temp of 96 at home this morning. Reports he went to a funeral last week in [place], got a call on Friday that someone at the funeral was positive. Pt reports sx started Monday.

## 2019-04-12 NOTE — ED Provider Notes (Signed)
[facility] Emergency Department Provider Note   ____________________________________________   First MD Initiated Contact with Patient 04/12/19 1225     (approximate)  I have reviewed the triage vital signs and the nursing notes.   HISTORY  Chief Complaint Weakness   HPI Zachary Khan is a 43 y.o. male presents to the ED with complaint of fever, muscle aches, headache and weakness.  Patient is also experienced some diarrhea.  He states he feels lethargic.  Patient's symptoms began 4 to 5 days ago.  He reports that he was in [place] for a funeral and got a call over the weekend someone at the funeral was positive for COVID-19.  Patient has been taking over-the-counter medication at home with some relief of the fever.  He has been afebrile today.  Patient currently is half pack a day smoker.       Past Medical History:  Diagnosis Date  . Asthma    AS A CHILD-NO INHALERS  . Cervical spine degeneration   . Chicken pox   . Cholelithiasis and acute cholecystitis without obstruction 12/08/2017  . Chronic back pain   . Frequent headaches   . GERD (gastroesophageal reflux disease)   . Migraine     Patient Active Problem List   Diagnosis Date Noted  . Suspected Covid-19 Virus Infection 04/11/2019  . GERD (gastroesophageal reflux disease) 02/22/2019  . Asthma 02/22/2019  . 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 Insomnia 08/14/2018  . Preventative health care 08/14/2018  . Essential hypertension 11/17/2017  . History of cholecystectomy 11/13/2017  . Migraines 06/01/2016  . Chronic back pain 06/01/2016  . Chronic neck pain 06/01/2016    Past Surgical History:  Procedure Laterality Date  . CHOLECYSTECTOMY N/A 11/28/2017   Procedure: LAPAROSCOPIC CHOLECYSTECTOMY;  Surgeon: Vickie Epley, MD;  Location: [facility] ORS;  Service: General;  Laterality: N/A;  . NO PAST SURGERIES      Prior to Admission medications   Medication Sig Start Date End Date Taking?  Authorizing Provider  amLODipine (NORVASC) 10 MG tablet TAKE 1 TABLET(10 MG) BY MOUTH DAILY FOR BLOOD PRESSURE 11/26/18   Pleas Koch, NP  diclofenac (VOLTAREN) 75 MG EC tablet TAKE 1 TABLET(75 MG) BY MOUTH TWICE DAILY AS NEEDED FOR PAIN AND INFLAMMATION 02/13/19   Pleas Koch, NP  famotidine (PEPCID) 20 MG tablet Take 1 tablet (20 mg total) by mouth 2 (two) times daily. 08/29/18   Carrie Mew, MD  fluticasone (FLOVENT HFA) 110 MCG/ACT inhaler Inhale 1 puff into the lungs 2 (two) times a day. 03/15/19   Pleas Koch, NP  lisinopril-hydrochlorothiazide (PRINZIDE,ZESTORETIC) 20-25 MG tablet Take 1 tablet by mouth daily. For blood pressure. 09/25/18   Pleas Koch, NP  SUMAtriptan (IMITREX) 100 MG tablet Take 1 tablet at migraine onset. May repeat in 2 hours if no resolve. Do not exceed 2 tablets in 24 hours. 09/25/18   Pleas Koch, NP  traZODone (DESYREL) 100 MG tablet TAKE 1 TABLET(100 MG) BY MOUTH AT BEDTIME FOR SLEEP 03/22/19   Pleas Koch, NP  VENTOLIN HFA 108 (90 Base) MCG/ACT inhaler INHALE 2 PUFFS INTO THE LUNGS EVERY 6 HOURS AS NEEDED FOR WHEEZING OR SHORTNESS OF BREATH 03/25/19   Pleas Koch, NP    Allergies Hydrocodone-acetaminophen, Sulfa antibiotics, Sulfasalazine, and Vicodin [hydrocodone-acetaminophen]  Family History  Problem Relation Age of Onset  . Arthritis Mother   . Heart disease Mother   . Hypertension Mother   . Alcohol abuse Father   .  ALS Father   . Rheum arthritis Maternal Grandmother     Social History Social History   Tobacco Use  . Smoking status: Current Every Day Smoker    Packs/day: 0.25    Years: 20.00    Pack years: 5.00    Types: Cigarettes  . Smokeless tobacco: Never Used  Substance Use Topics  . Alcohol use: Yes    Comment: 2 BEERS ON WEEKEND  . Drug use: Yes    Types: Marijuana    Comment: OCC-NOT EVERYDAY    Review of Systems Constitutional: Positive fever/chills Eyes: No visual changes. ENT:  No sore throat. Cardiovascular: Denies chest pain. Respiratory: Positive shortness of breath.  Gastrointestinal: No abdominal pain.  No nausea, no vomiting.  Positive diarrhea.  No constipation. Genitourinary: Negative for dysuria. Musculoskeletal: Positive muscle aches. Skin: Negative for rash. Neurological: Positive for headaches, no focal weakness or numbness. ____________________________________________   PHYSICAL EXAM:  VITAL SIGNS: ED Triage Vitals  Enc Vitals Group     BP 04/12/19 1217 132/82     Pulse Rate 04/12/19 1217 70     Resp 04/12/19 1217 16     Temp 04/12/19 1217 98.3 F (36.8 C)     Temp Source 04/12/19 1217 Oral     SpO2 04/12/19 1217 97 %     Weight 04/12/19 1218 184 lb (83.5 kg)     Height 04/12/19 1218 5\' 8"  (1.727 m)     Head Circumference --      Peak Flow --      Pain Score --      Pain Loc --      Pain Edu? --      Excl. in GC? --    Constitutional: Alert and oriented. Well appearing and in no acute distress. Eyes: Conjunctivae are normal. PERRL. EOMI. Head: Atraumatic. Nose: No congestion/rhinnorhea.  EACs are clear.  TMs are dull but no erythema noted. Mouth/Throat: Mucous membranes are moist.  Oropharynx non-erythematous. Neck: No stridor.   Hematological/Lymphatic/Immunilogical: No cervical lymphadenopathy. Cardiovascular: Normal rate, regular rhythm. Grossly normal heart sounds.  Good peripheral circulation. Respiratory: Normal respiratory effort.  No retractions. Lungs CTAB. Gastrointestinal: Soft and nontender. No distention.  Musculoskeletal: Moves upper and lower extremities without any difficulty. Neurologic:  Normal speech and language. No gross focal neurologic deficits are appreciated. No gait instability. Skin:  Skin is warm, dry and intact. No rash noted. Psychiatric: Mood and affect are normal. Speech and behavior are normal.  ____________________________________________   LABS (all labs ordered are listed, but only abnormal  results are displayed)  Labs Reviewed  SARS CORONAVIRUS 2 (HOSPITAL ORDER, PERFOR[facility])    RADIOLOGY   Official radiology report(s): Dg Chest Portable 1 View  Result Date: 04/12/2019 CLINICAL DATA:  Headache, weakness and low-grade fever. EXAM: PORTABLE CHEST 1 VIEW COMPARISON:  Chest x-ray 11/13/2017 FINDINGS: The cardiac silhouette, mediastinal and hilar contours are within normal limits given the AP projection and portable technique. Low lung volumes with vascular crowding and streaky atelectasis but no infiltrates, edema or effusions. No worrisome pulmonary lesions. The bony thorax is intact. IMPRESSION: No acute cardiopulmonary findings. Electronically Signed   By: Rudie MeyerP.  Gallerani M.D.   On: 04/12/2019 14:45    ____________________________________________   PROCEDURES  Procedure(s) performed (including Critical Care):  Procedures   ____________________________________________   INITIAL IMPRESSION / ASSESSMENT AND PLAN / ED COURSE  As part of my medical decision making, I reviewed the following data within the electronic MEDICAL RECORD NUMBER  Notes from prior ED visits and [place] Controlled Substance Database  43 year old male presents to the ED with complaint of feeling lethargic, muscle aches, low-grade temp, headache, low-grade temp and some diarrhea.  Patient states that he was in [place] last week for a funeral and was called on Friday to tell him that he possibly was exposed to Covid 19.  Patient began experiencing symptoms 5 days ago.  Chest x-ray was negative.  COVID test was negative.  Patient was made aware that he could also have a viral illness and was given instructions on clear liquids for his diarrhea.  He is to continue with Tylenol or ibuprofen as needed for muscle aches.  Today in the ED he is afebrile. ____________________________________________   FINAL CLINICAL IMPRESSION(S) / ED DIAGNOSES  Final diagnoses:  Viral illness  Diarrhea,  unspecified type     ED Discharge Orders    None       Note:  This document was prepared using Dragon voice recognition software and may include unintentional dictation errors.    Tommi RumpsSummers,  L, PA-C 04/12/19 1559    Minna AntisPaduchowski, Kevin, MD 04/13/19 [phone]

## 2019-04-16 ENCOUNTER — Other Ambulatory Visit: Payer: Self-pay | Admitting: Primary Care

## 2019-04-16 ENCOUNTER — Telehealth: Payer: Self-pay

## 2019-04-16 NOTE — Telephone Encounter (Signed)
Copied from Edie [phone]. Topic: Appointment Scheduling - Scheduling Inquiry for Clinic >> Apr 15, 2019  4:35 PM Mathis Bud wrote: Reason for CRM: patient is calling to set up lab appt. Patient called after hours. Call back # [phone]

## 2019-04-18 NOTE — Telephone Encounter (Signed)
Patient recently tested negative for COVID-19. Can we get his labs drawn?

## 2019-04-18 NOTE — Telephone Encounter (Signed)
Patient called to schedule lab appointment, Was not able to schedule due to not passing Covid Screen Patient has fever, cough and SOB.   Would you like patient to wait a week to see if symptoms are gone then schedule lab appointment?

## 2019-04-18 NOTE — Telephone Encounter (Signed)
Per Terri since Covid-19 test was neg she was okay doing labs. Lab appt scheduled and back door ins given

## 2019-04-22 ENCOUNTER — Other Ambulatory Visit (INDEPENDENT_AMBULATORY_CARE_PROVIDER_SITE_OTHER): Payer: 59

## 2019-04-22 ENCOUNTER — Other Ambulatory Visit: Payer: Self-pay

## 2019-04-22 DIAGNOSIS — R5383 Other fatigue: Secondary | ICD-10-CM

## 2019-04-22 LAB — CBC WITH DIFFERENTIAL/PLATELET
Basophils Absolute: 0.1 10*3/uL (ref 0.0–0.1)
Basophils Relative: 1 % (ref 0.0–3.0)
Eosinophils Absolute: 0.9 10*3/uL — ABNORMAL HIGH (ref 0.0–0.7)
Eosinophils Relative: 9.9 % — ABNORMAL HIGH (ref 0.0–5.0)
HCT: 46.5 % (ref 39.0–52.0)
Hemoglobin: 15.7 g/dL (ref 13.0–17.0)
Lymphocytes Relative: 34.9 % (ref 12.0–46.0)
Lymphs Abs: 3 10*3/uL (ref 0.7–4.0)
MCHC: 33.8 g/dL (ref 30.0–36.0)
MCV: 94.7 fl (ref 78.0–100.0)
Monocytes Absolute: 0.8 10*3/uL (ref 0.1–1.0)
Monocytes Relative: 9.2 % (ref 3.0–12.0)
Neutro Abs: 3.9 10*3/uL (ref 1.4–7.7)
Neutrophils Relative %: 45 % (ref 43.0–77.0)
Platelets: 281 10*3/uL (ref 150.0–400.0)
RBC: 4.91 Mil/uL (ref 4.22–5.81)
RDW: 12.5 % (ref 11.5–15.5)
WBC: 8.6 10*3/uL (ref 4.0–10.5)

## 2019-04-22 LAB — POC URINALSYSI DIPSTICK (AUTOMATED)
Bilirubin, UA: NEGATIVE
Blood, UA: NEGATIVE
Glucose, UA: NEGATIVE
Ketones, UA: NEGATIVE
Leukocytes, UA: NEGATIVE
Nitrite, UA: NEGATIVE
Protein, UA: NEGATIVE
Spec Grav, UA: 1.015 (ref 1.010–1.025)
Urobilinogen, UA: 0.2 E.U./dL
pH, UA: 7 (ref 5.0–8.0)

## 2019-04-22 LAB — VITAMIN B12: Vitamin B-12: 142 pg/mL — ABNORMAL LOW (ref 211–911)

## 2019-04-22 LAB — TSH: TSH: 1.19 u[IU]/mL (ref 0.35–4.50)

## 2019-04-23 LAB — COMPREHENSIVE METABOLIC PANEL
ALT: 25 U/L (ref 0–53)
AST: 13 U/L (ref 0–37)
Albumin: 4.2 g/dL (ref 3.5–5.2)
Alkaline Phosphatase: 51 U/L (ref 39–117)
BUN: 14 mg/dL (ref 6–23)
CO2: 27 mEq/L (ref 19–32)
Calcium: 9.4 mg/dL (ref 8.4–10.5)
Chloride: 103 mEq/L (ref 96–112)
Creatinine, Ser: 1.04 mg/dL (ref 0.40–1.50)
GFR: 78.29 mL/min (ref >60.00)
Glucose, Bld: 83 mg/dL (ref 70–99)
Potassium: 3.6 mEq/L (ref 3.5–5.1)
Sodium: 141 mEq/L (ref 135–145)
Total Bilirubin: 0.7 mg/dL (ref 0.2–1.2)
Total Protein: 6.6 g/dL (ref 6.0–8.3)

## 2019-05-13 ENCOUNTER — Other Ambulatory Visit: Payer: Self-pay | Admitting: Primary Care

## 2019-05-13 DIAGNOSIS — M25539 Pain in unspecified wrist: Secondary | ICD-10-CM

## 2019-05-13 DIAGNOSIS — G8929 Other chronic pain: Secondary | ICD-10-CM

## 2019-05-13 NOTE — Telephone Encounter (Signed)
LOV 04/11/2019 for acute. No future appointment. Last filled on 02/13/2019 for #[id number] with 0 refill

## 2019-05-14 ENCOUNTER — Other Ambulatory Visit: Payer: Self-pay | Admitting: Primary Care

## 2019-05-26 IMAGING — US US ABDOMEN LIMITED
1 series · 14 of 25 positions shown · non-contrast
Comparison: None.

CLINICAL DATA: Right upper quadrant abdominal pain for 1 week.
Nausea.

EXAM:
ULTRASOUND ABDOMEN LIMITED RIGHT UPPER QUADRANT

[Series 1: us abdomen limited · 0.18mm/px · 14 of 63 slices shown]
[im 1/63]
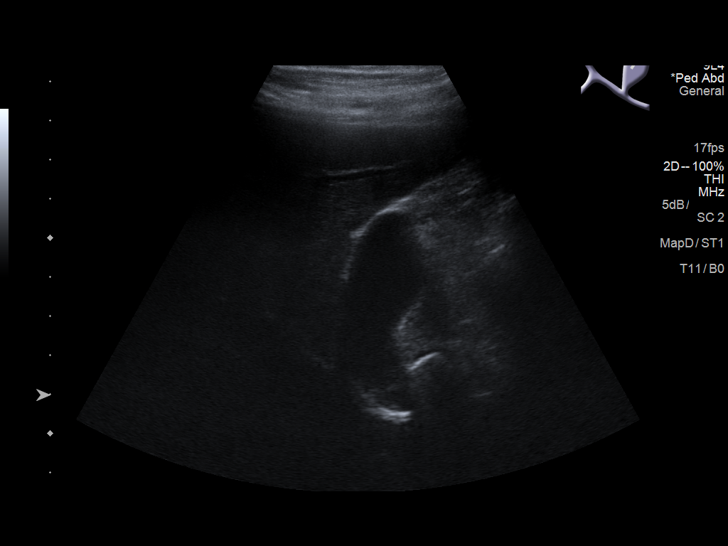
[im 6/63]
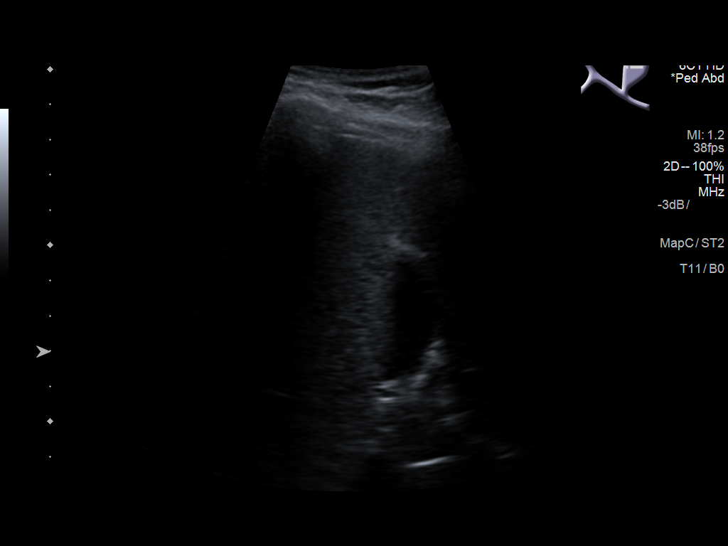
[im 11/63]
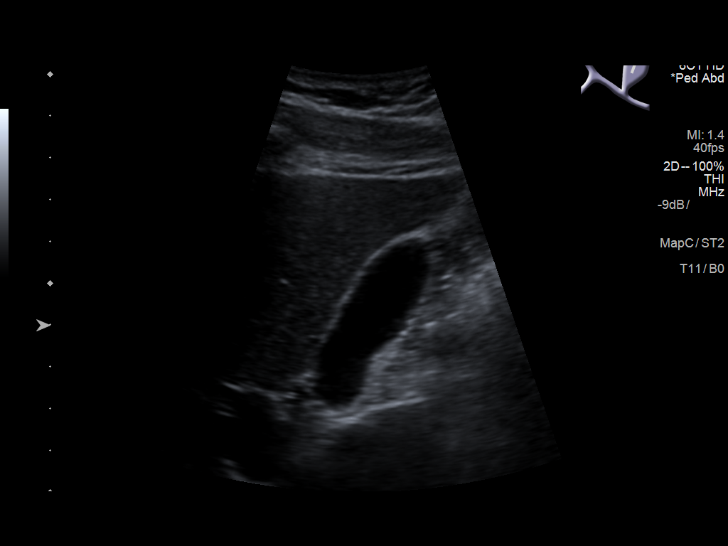
[im 16/63]
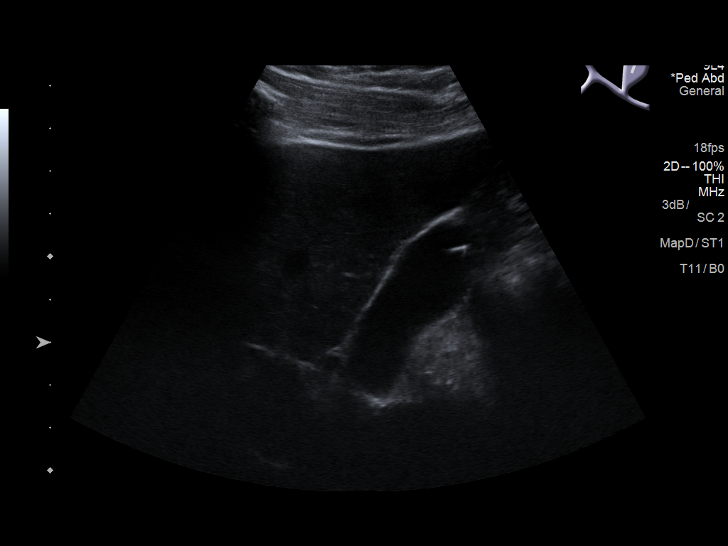
[im 21/63]
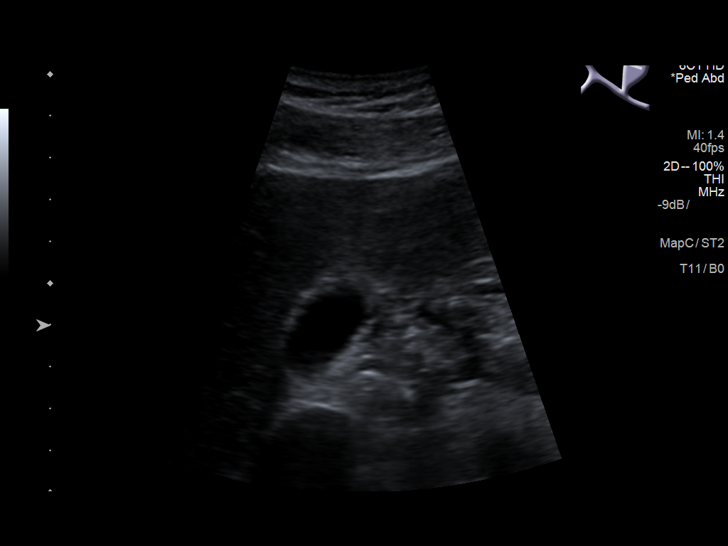
[im 24/63]
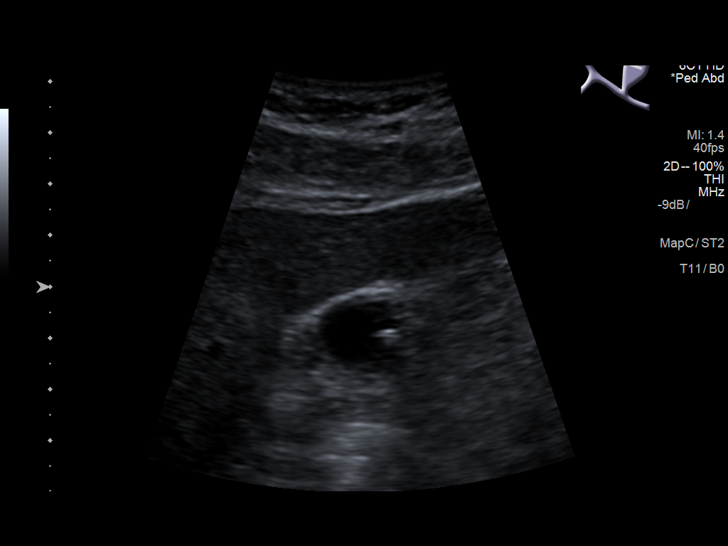
[im 29/63]
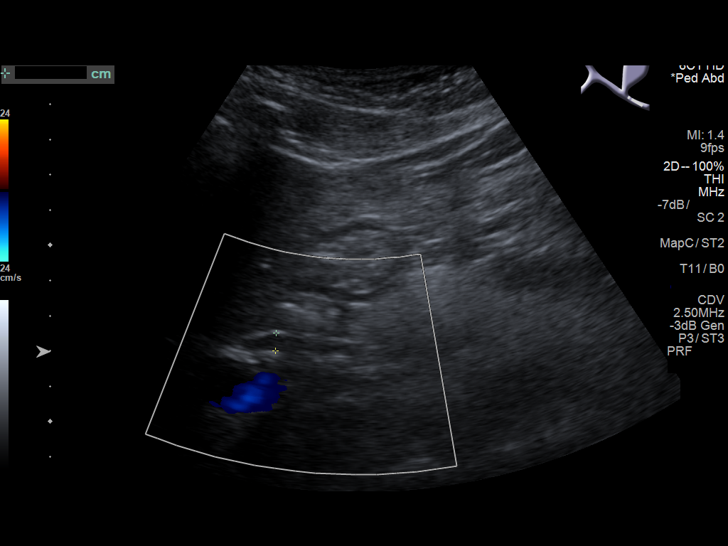
[im 34/63]
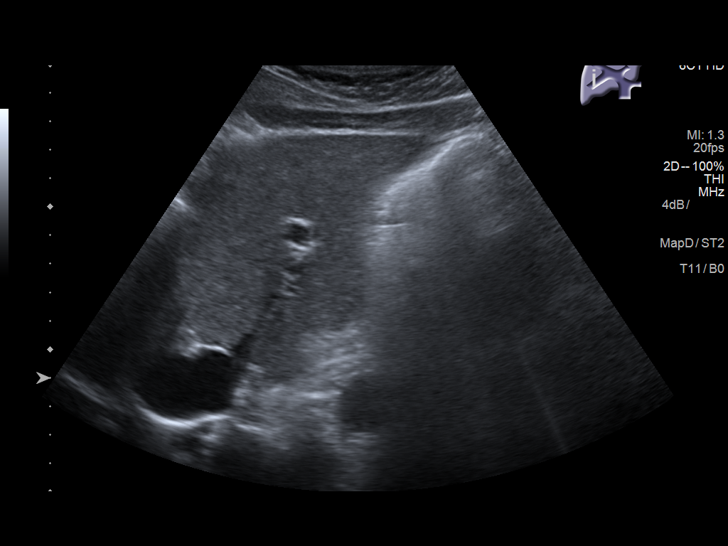
[im 39/63]
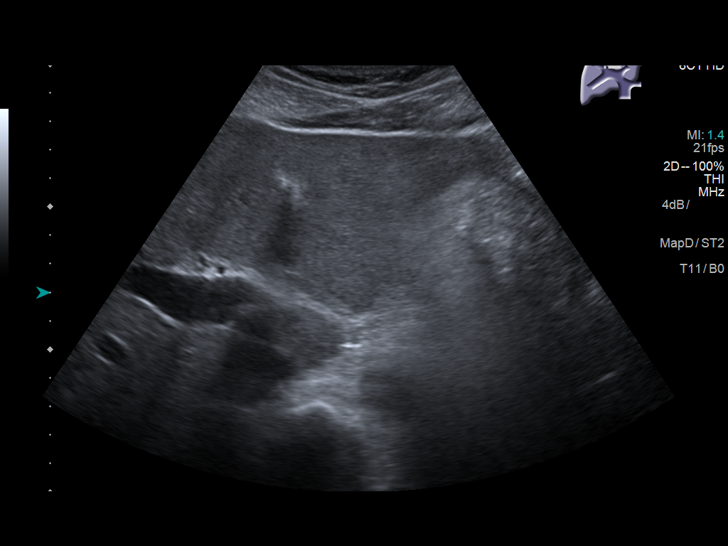
[im 42/63]
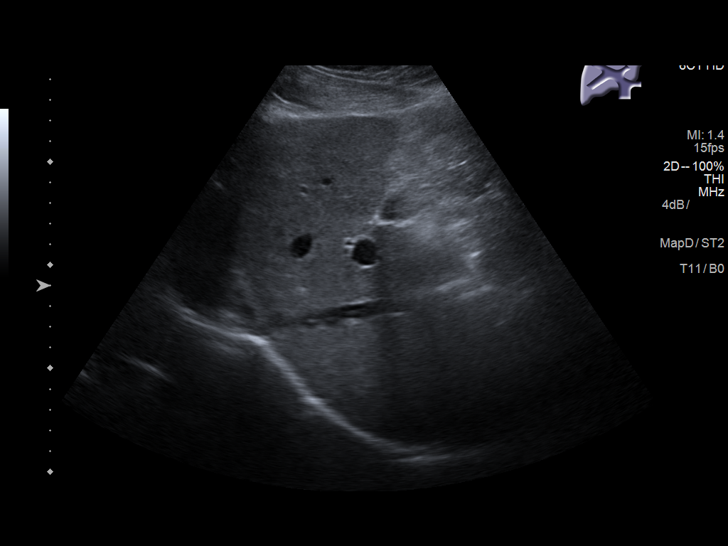
[im 47/63]
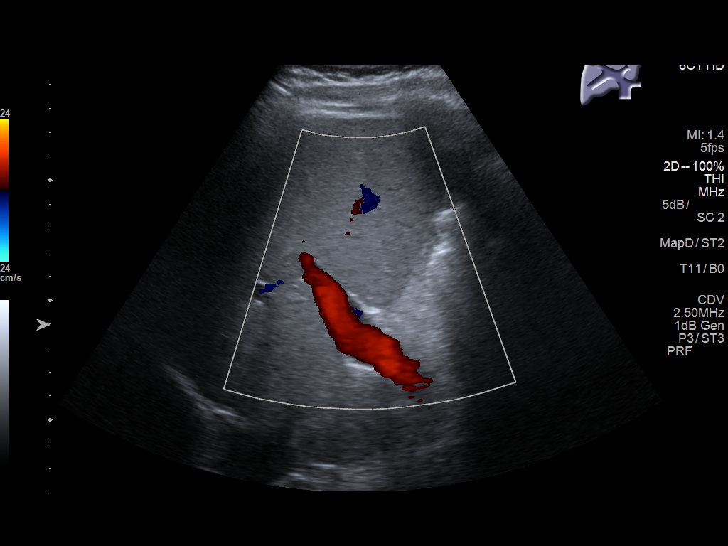
[im 52/63]
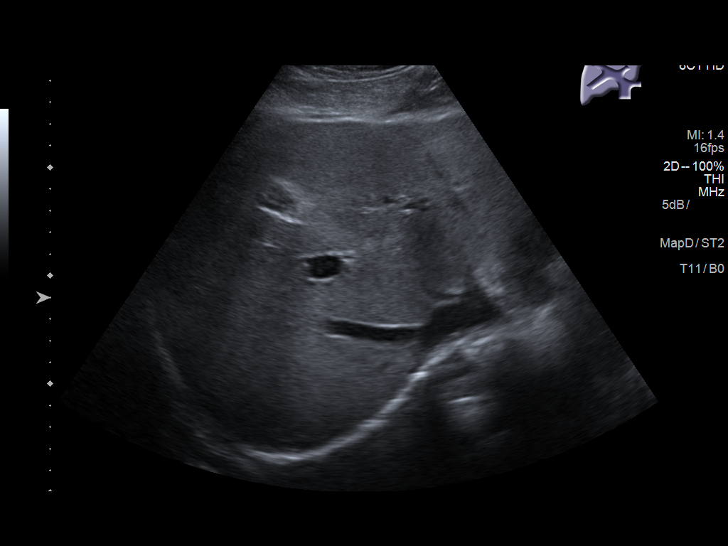
[im 57/63]
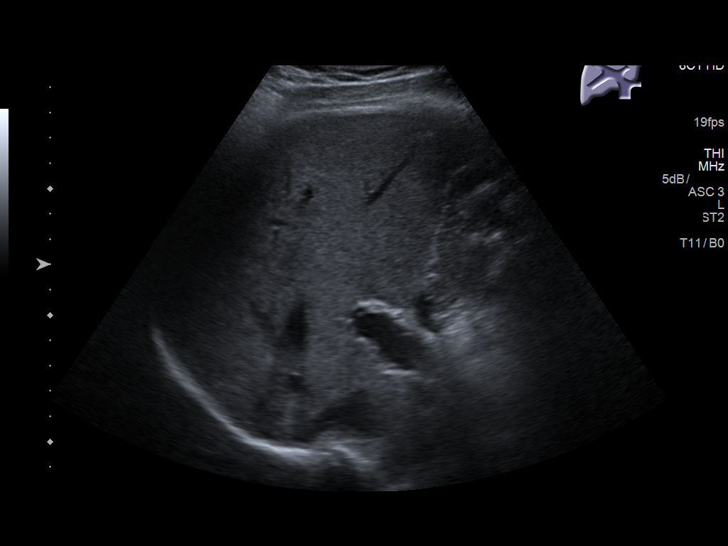
[im 63/63]
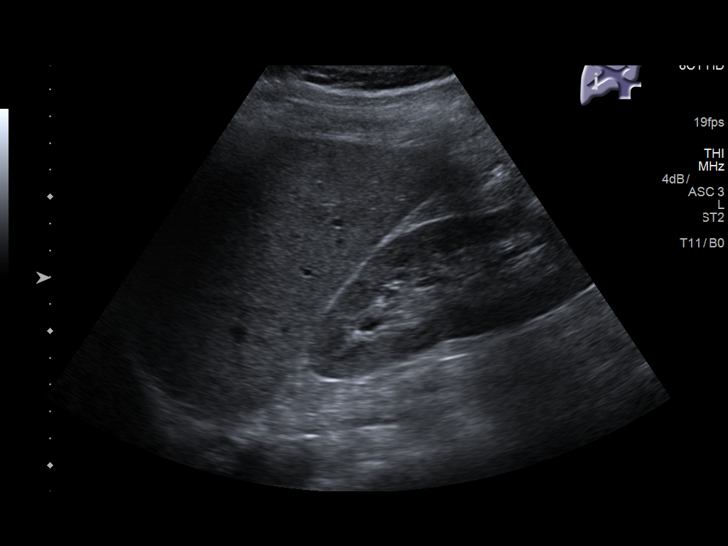

[14 of 25 positions shown; findings below may reference images not displayed]

FINDINGS: Gallbladder:

Small stone. No gallbladder wall thickening or evidence of acute
cholecystitis.

Common bile duct:

Diameter: 5 mm

Liver:

No focal lesion identified. Within normal limits in parenchymal
echogenicity. Portal vein is patent on color Doppler imaging with
normal direction of blood flow towards the liver.
IMPRESSION: 1. No acute findings.  No evidence of acute cholecystitis.
2. Small gallstone.  No other abnormalities.

## 2019-07-22 ENCOUNTER — Encounter: Payer: Self-pay | Admitting: Internal Medicine

## 2019-07-22 ENCOUNTER — Ambulatory Visit (INDEPENDENT_AMBULATORY_CARE_PROVIDER_SITE_OTHER): Payer: 59 | Admitting: Internal Medicine

## 2019-07-22 VITALS — Temp 99.9°F

## 2019-07-22 DIAGNOSIS — J029 Acute pharyngitis, unspecified: Secondary | ICD-10-CM

## 2019-07-22 DIAGNOSIS — R05 Cough: Secondary | ICD-10-CM

## 2019-07-22 DIAGNOSIS — R0981 Nasal congestion: Secondary | ICD-10-CM

## 2019-07-22 DIAGNOSIS — R059 Cough, unspecified: Secondary | ICD-10-CM

## 2019-07-22 DIAGNOSIS — Z20822 Contact with and (suspected) exposure to covid-19: Secondary | ICD-10-CM

## 2019-07-22 DIAGNOSIS — R52 Pain, unspecified: Secondary | ICD-10-CM

## 2019-07-22 DIAGNOSIS — R509 Fever, unspecified: Secondary | ICD-10-CM

## 2019-07-22 DIAGNOSIS — Z20828 Contact with and (suspected) exposure to other viral communicable diseases: Secondary | ICD-10-CM

## 2019-07-22 NOTE — Progress Notes (Signed)
Virtual Visit via Video Note  I connected with Zachary Khan on 07/22/19 at 10:45 AM EDT by a video enabled telemedicine application and verified that I am speaking with the correct person using two identifiers.  Location: Patient: Home Provider: Office   I discussed the limitations of evaluation and management by telemedicine and the availability of in person appointments. The patient expressed understanding and agreed to proceed.  History of Present Illness:  Patient reports contact with a COVID positive individual and began experiencing symptoms approximately 36 hours later. He reports 1 episode of vomiting and diarrhea which has abated since the beginning of this illness. He currently has a sore throat, congestion, nonproductive cough, and dull left sided abdominal pain. He denies difficulty swallowing. He is not blowing anything out of his nose. He denies shortness of breath, loss of taste or smell. He reports fever up to 100., had chills and body aches.   Past Medical History:  Diagnosis Date  . Asthma    AS A CHILD-NO INHALERS  . Cervical spine degeneration   . Chicken pox   . Cholelithiasis and acute cholecystitis without obstruction 12/08/2017  . Chronic back pain   . Frequent headaches   . GERD (gastroesophageal reflux disease)   . Migraine     Current Outpatient Medications  Medication Sig Dispense Refill  . amLODipine (NORVASC) 10 MG tablet TAKE 1 TABLET(10 MG) BY MOUTH DAILY FOR BLOOD PRESSURE 90 tablet 3  . diclofenac (VOLTAREN) 75 MG EC tablet TAKE 1 TABLET(75 MG) BY MOUTH TWICE DAILY AS NEEDED FOR PAIN AND INFLAMMATION 180 tablet 0  . famotidine (PEPCID) 20 MG tablet Take 1 tablet (20 mg total) by mouth 2 (two) times daily. 60 tablet 0  . fluticasone (FLOVENT HFA) 110 MCG/ACT inhaler Inhale 1 puff into the lungs 2 (two) times a day. 3 Inhaler 0  . lisinopril-hydrochlorothiazide (PRINZIDE,ZESTORETIC) 20-25 MG tablet Take 1 tablet by mouth daily. For blood pressure. 90  tablet 3  . SUMAtriptan (IMITREX) 100 MG tablet Take 1 tablet at migraine onset. May repeat in 2 hours if no resolve. Do not exceed 2 tablets in 24 hours. 10 tablet 0  . traZODone (DESYREL) 100 MG tablet TAKE 1 TABLET(100 MG) BY MOUTH AT BEDTIME FOR SLEEP 90 tablet 1  . VENTOLIN HFA 108 (90 Base) MCG/ACT inhaler INHALE 2 PUFFS INTO THE LUNGS EVERY 6 HOURS AS NEEDED FOR WHEEZING OR SHORTNESS OF BREATH 18 g 0   No current facility-administered medications for this visit.     Allergies  Allergen Reactions  . Hydrocodone-Acetaminophen Hives  . Sulfa Antibiotics Hives  . Sulfasalazine Hives  . Vicodin [Hydrocodone-Acetaminophen] Hives    Family History  Problem Relation Age of Onset  . Arthritis Mother   . Heart disease Mother   . Hypertension Mother   . Alcohol abuse Father   . ALS Father   . Rheum arthritis Maternal Grandmother     Social History   Socioeconomic History  . Marital status: Single    Spouse name: Not on file  . Number of children: Not on file  . Years of education: Not on file  . Highest education level: Not on file  Occupational History  . Not on file  Social Needs  . Financial resource strain: Not on file  . Food insecurity    Worry: Not on file    Inability: Not on file  . Transportation needs    Medical: Not on file    Non-medical: Not on file  Tobacco Use  . Smoking status: Current Every Day Smoker    Packs/day: 0.25    Years: 20.00    Pack years: 5.00    Types: Cigarettes  . Smokeless tobacco: Never Used  Substance and Sexual Activity  . Alcohol use: Yes    Comment: 2 BEERS ON WEEKEND  . Drug use: Yes    Types: Marijuana    Comment: OCC-NOT EVERYDAY  . Sexual activity: Not on file  Lifestyle  . Physical activity    Days per week: Not on file    Minutes per session: Not on file  . Stress: Not on file  Relationships  . Social Musician on phone: Not on file    Gets together: Not on file    Attends religious service: Not on  file    Active member of club or organization: Not on file    Attends meetings of clubs or organizations: Not on file    Relationship status: Not on file  . Intimate partner violence    Fear of current or ex partner: Not on file    Emotionally abused: Not on file    Physically abused: Not on file    Forced sexual activity: Not on file  Other Topics Concern  . Not on file  Social History Narrative   Single.   4 children.   Works as a Retail banker.   Highest level of education: GED.     Constitutional: Pt reports fever, chills and body aches. Denies fatigue, headache or abrupt weight changes.  HEENT: Pt reports nasal congestion, sore throat. Denies eye pain, eye redness, ear pain, ringing in the ears, wax buildup, runny nose, nasal bloody nose. Respiratory: Pt reports cough. Denies difficulty breathing, shortness of breath, or sputum production.   Cardiovascular: Denies chest pain, chest tightness, palpitations or swelling in the hands or feet.  Gastrointestinal: Pt reports nausea and vomiting. Denies abdominal pain, bloating, constipation, diarrhea or blood in the stool.   No other specific complaints in a complete review of systems (except as listed in HPI above).   Observations/Objective:  Temp 99.9 F (37.7 C) (Oral)   General: Appears stated age. In NAD Skin: No apparent rash noted. Lungs: Respirations normal. No audible cough or wheezing noted.  Assessment and Plan:  Cough, Sore throat, Congestion, Fever and Body Aches, Nausea and Vomiting, Exposure to COVID:  Differential includes COVID19, URI, other viral illness.  Patient has been advised to get COVID tested this afternoon and to self-isolate until results are available.  He has been counseled to rest, continue fluid intake, and treat symptoms with over the counter medications.  ER precautions discussed He requires a note for work and was told that a document will be made available for pick up at the  clinic.  Return precautions discussed  Follow Up Instructions:    I discussed the assessment and treatment plan with the patient. The patient was provided an opportunity to ask questions and all were answered. The patient agreed with the plan and demonstrated an understanding of the instructions.   The patient was advised to call back or seek an in-person evaluation if the symptoms worsen or if the condition fails to improve as anticipated.     Nicki Reaper, NP

## 2019-07-22 NOTE — Patient Instructions (Signed)
COVID-19 Frequently Asked Questions °COVID-19 (coronavirus disease) is an infection that is caused by a large family of viruses. Some viruses cause illness in people and others cause illness in animals like camels, cats, and bats. In some cases, the viruses that cause illness in animals can spread to humans. °Where did the coronavirus come from? °In December 2019, [place] told the World Health Organization (WHO) of several cases of lung disease (human respiratory illness). These cases were linked to an open seafood and livestock market in the city of [place]. The link to the seafood and livestock market suggests that the virus may have spread from animals to humans. However, since that first outbreak in December, the virus has also been shown to spread from person to person. °What is the name of the disease and the virus? °Disease name °Early on, this disease was called novel coronavirus. This is because scientists determined that the disease was caused by a new (novel) respiratory virus. The World Health Organization (WHO) has now named the disease COVID-19, or coronavirus disease. °Virus name °The virus that causes the disease is called severe acute respiratory syndrome coronavirus 2 (SARS-CoV-2). °More information on disease and virus naming °World Health Organization (WHO): www.who.int/emergencies/diseases/novel-coronavirus-2019/technical-guidance/naming-the-coronavirus-disease-(covid-2019)-and-the-virus-that-causes-it °Who is at risk for complications from coronavirus disease? °Some people may be at higher risk for complications from coronavirus disease. This includes older adults and people who have chronic diseases, such as heart disease, diabetes, and lung disease. °If you are at higher risk for complications, take these extra precautions: °· Avoid close contact with people who are sick or have a fever or cough. Stay at least 3-6 ft (1-2 m) away from them, if possible. °· Wash your hands often with soap and  water for at least 20 seconds. °· Avoid touching your face, mouth, nose, or eyes. °· Keep supplies on hand at home, such as food, medicine, and cleaning supplies. °· Stay home as much as possible. °· Avoid social gatherings and travel. °How does coronavirus disease spread? °The virus that causes coronavirus disease spreads easily from person to person (is contagious). There are also cases of community-spread disease. This means the disease has spread to: °· People who have no known contact with other infected people. °· People who have not traveled to areas where there are known cases. °It appears to spread from one person to another through droplets from coughing or sneezing. °Can I get the virus from touching surfaces or objects? °There is still a lot that we do not know about the virus that causes coronavirus disease. Scientists are basing a lot of information on what they know about similar viruses, such as: °· Viruses cannot generally survive on surfaces for long. They need a human body (host) to survive. °· It is more likely that the virus is spread by close contact with people who are sick (direct contact), such as through: °? Shaking hands or hugging. °? Breathing in respiratory droplets that travel through the air. This can happen when an infected person coughs or sneezes on or near other people. °· It is less likely that the virus is spread when a person touches a surface or object that has the virus on it (indirect contact). The virus may be able to enter the body if the person touches a surface or object and then touches his or her face, eyes, nose, or mouth. °Can a person spread the virus without having symptoms of the disease? °It may be possible for the virus to spread before a person   has symptoms of the disease, but this is most likely not the main way the virus is spreading. It is more likely for the virus to spread by being in close contact with people who are sick and breathing in the respiratory  droplets of a sick person's cough or sneeze. °What are the symptoms of coronavirus disease? °Symptoms vary from person to person and can range from mild to severe. Symptoms may include: °· Fever. °· Cough. °· Tiredness, weakness, or fatigue. °· Fast breathing or feeling short of breath. °These symptoms can appear anywhere from 2 to 14 days after you have been exposed to the virus. If you develop symptoms, call your health care provider. People with severe symptoms may need hospital care. °If I am exposed to the virus, how long does it take before symptoms start? °Symptoms of coronavirus disease may appear anywhere from 2 to 14 days after a person has been exposed to the virus. If you develop symptoms, call your health care provider. °Should I be tested for this virus? °Your health care provider will decide whether to test you based on your symptoms, history of exposure, and your risk factors. °How does a health care provider test for this virus? °Health care providers will collect samples to send for testing. Samples may include: °· Taking a swab of fluid from the nose. °· Taking fluid from the lungs by having you cough up mucus (sputum) into a sterile cup. °· Taking a blood sample. °· Taking a stool or urine sample. °Is there a treatment or vaccine for this virus? °Currently, there is no vaccine to prevent coronavirus disease. Also, there are no medicines like antibiotics or antivirals to treat the virus. A person who becomes sick is given supportive care, which means rest and fluids. A person may also relieve his or her symptoms by using over-the-counter medicines that treat sneezing, coughing, and runny nose. These are the same medicines that a person takes for the common cold. °If you develop symptoms, call your health care provider. People with severe symptoms may need hospital care. °What can I do to protect myself and my family from this virus? ° °  ° °You can protect yourself and your family by taking the  same actions that you would take to prevent the spread of other viruses. Take the following actions: °· Wash your hands often with soap and water for at least 20 seconds. If soap and water are not available, use alcohol-based hand sanitizer. °· Avoid touching your face, mouth, nose, or eyes. °· Cough or sneeze into a tissue, sleeve, or elbow. Do not cough or sneeze into your hand or the air. °? If you cough or sneeze into a tissue, throw it away immediately and wash your hands. °· Disinfect objects and surfaces that you frequently touch every day. °· Avoid close contact with people who are sick or have a fever or cough. Stay at least 3-6 ft (1-2 m) away from them, if possible. °· Stay home if you are sick, except to get medical care. Call your health care provider before you get medical care. °· Make sure your vaccines are up to date. Ask your health care provider what vaccines you need. °What should I do if I need to travel? °Follow travel recommendations from your local health authority, the CDC, and WHO. °Travel information and advice °· Centers for Disease Control and Prevention (CDC): www.cdc.gov/coronavirus/2019-ncov/travelers/index.html °· World Health Organization (WHO): www.who.int/emergencies/diseases/novel-coronavirus-2019/travel-advice °Know the risks and take action to protect your health °·   You are at higher risk of getting coronavirus disease if you are traveling to areas with an outbreak or if you are exposed to travelers from areas with an outbreak. °· Wash your hands often and practice good hygiene to lower the risk of catching or spreading the virus. °What should I do if I am sick? °General instructions to stop the spread of infection °· Wash your hands often with soap and water for at least 20 seconds. If soap and water are not available, use alcohol-based hand sanitizer. °· Cough or sneeze into a tissue, sleeve, or elbow. Do not cough or sneeze into your hand or the air. °· If you cough or  sneeze into a tissue, throw it away immediately and wash your hands. °· Stay home unless you must get medical care. Call your health care provider or local health authority before you get medical care. °· Avoid public areas. Do not take public transportation, if possible. °· If you can, wear a mask if you must go out of the house or if you are in close contact with someone who is not sick. °Keep your home clean °· Disinfect objects and surfaces that are frequently touched every day. This may include: °? Counters and tables. °? Doorknobs and light switches. °? Sinks and faucets. °? Electronics such as phones, remote controls, keyboards, computers, and tablets. °· Wash dishes in hot, soapy water or use a dishwasher. Air-dry your dishes. °· Wash laundry in hot water. °Prevent infecting other household members °· Let healthy household members care for children and pets, if possible. If you have to care for children or pets, wash your hands often and wear a mask. °· Sleep in a different bedroom or bed, if possible. °· Do not share personal items, such as razors, toothbrushes, deodorant, combs, brushes, towels, and washcloths. °Where to find more information °Centers for Disease Control and Prevention (CDC) °· Information and news updates: www.cdc.gov/coronavirus/2019-ncov °World Health Organization (WHO) °· Information and news updates: www.who.int/emergencies/diseases/novel-coronavirus-2019 °· Coronavirus health topic: www.who.int/health-topics/coronavirus °· Questions and answers on COVID-19: www.who.int/news-room/q-a-detail/q-a-coronaviruses °· Global tracker: who.sprinklr.com °American Academy of Pediatrics (AAP) °· Information for families: www.healthychildren.org/English/health-issues/conditions/chest-lungs/Pages/2019-Novel-Coronavirus.aspx °The coronavirus situation is changing rapidly. Check your local health authority website or the CDC and WHO websites for updates and news. °When should I contact a health care  provider? °· Contact your health care provider if you have symptoms of an infection, such as fever or cough, and you: °? Have been near anyone who is known to have coronavirus disease. °? Have come into contact with a person who is suspected to have coronavirus disease. °? Have traveled outside of the country. °When should I get emergency medical care? °· Get help right away by calling your local emergency services (911 in the [place] if you have: °? Trouble breathing. °? Pain or pressure in your chest. °? Confusion. °? Blue-tinged lips and fingernails. °? Difficulty waking from sleep. °? Symptoms that get worse. °Let the emergency medical personnel know if you think you have coronavirus disease. °Summary °· A new respiratory virus is spreading from person to person and causing COVID-19 (coronavirus disease). °· The virus that causes COVID-19 appears to spread easily. It spreads from one person to another through droplets from coughing or sneezing. °· Older adults and those with chronic diseases are at higher risk of disease. If you are at higher risk for complications, take extra precautions. °· There is currently no vaccine to prevent coronavirus disease. There are no medicines, such as antibiotics or   antivirals, to treat the virus. °· You can protect yourself and your family by washing your hands often, avoiding touching your face, and covering your coughs and sneezes. °This information is not intended to replace advice given to you by your health care provider. Make sure you discuss any questions you have with your health care provider. °Document Released: 02/05/2019 Document Revised: 02/05/2019 Document Reviewed: 02/05/2019 °Elsevier Patient Education © 2020 [employer] ° °

## 2019-07-23 ENCOUNTER — Other Ambulatory Visit: Payer: Self-pay

## 2019-07-23 DIAGNOSIS — Z20822 Contact with and (suspected) exposure to covid-19: Secondary | ICD-10-CM

## 2019-07-24 LAB — NOVEL CORONAVIRUS, NAA: SARS-CoV-2, NAA: NOT DETECTED

## 2019-07-25 ENCOUNTER — Telehealth: Payer: Self-pay

## 2019-07-25 NOTE — Telephone Encounter (Signed)
Pt said 3 wks ago pt had all upper teeth pulled and has not been able to eat normally and has lost 15 lbs; pt said for 1 1/2 wks pt has had dizziness on and off and worse when stands up and vision is blurred on and off. Pt got negative covid testing and today is last day for quarantining. Pt has not missed any BP meds and is not diabetic. Pt does not have a way to ck BP. Offered pt a sooner appt but pt said [facility] is best for him and UC& ED precautions given and pt voiced understanding. FYI to Gentry Fitz NP.

## 2019-07-26 NOTE — Telephone Encounter (Signed)
Will see Monday. UC/ED precautions.

## 2019-07-29 ENCOUNTER — Other Ambulatory Visit: Payer: Self-pay

## 2019-07-29 ENCOUNTER — Encounter: Payer: Self-pay | Admitting: Primary Care

## 2019-07-29 ENCOUNTER — Ambulatory Visit (INDEPENDENT_AMBULATORY_CARE_PROVIDER_SITE_OTHER): Payer: 59 | Admitting: Primary Care

## 2019-07-29 VITALS — BP 126/86 | HR 77 | Temp 98.8°F | Ht 66.5 in | Wt 180.0 lb

## 2019-07-29 DIAGNOSIS — R42 Dizziness and giddiness: Secondary | ICD-10-CM | POA: Diagnosis not present

## 2019-07-29 DIAGNOSIS — I10 Essential (primary) hypertension: Secondary | ICD-10-CM

## 2019-07-29 DIAGNOSIS — Z23 Encounter for immunization: Secondary | ICD-10-CM

## 2019-07-29 NOTE — Assessment & Plan Note (Signed)
Stable in the office today on initial check. Orthostatic vital signs negative today.  Unclear etiology to dizziness, doesn't seem to be vertigo but will keep on differentials list.  Start with reduction of BP medication treatment, hold Amlodipine 10 mg. Continue lisinopril-HCTZ for now.  Will have him monitor BP at home and report readings that are at or above 135/90 consistently.

## 2019-07-29 NOTE — Patient Instructions (Signed)
Hold Amlodipine 10 mg tablets for blood pressure. Continue taking lisinopril-hydrochlorothiazide for blood pressure.  Try to check your blood pressure over the next several weeks if possible.   Ensure you are consuming 64 ounces of water daily.  Stop by the lab prior to leaving today. I will notify you of your results once received.   Please update me in two weeks if your dizziness persists.   It was a pleasure to see you today!

## 2019-07-29 NOTE — Progress Notes (Signed)
Subjective:    Patient ID: Zachary Khan, male    DOB: 02/04/1976, 43 y.o.   MRN: [id number]  HPI  Mr. Janik is a 43 year old male with a history of migraines, hypertension, asthma, chronic pain who presents today with a chief complaint of dizziness.  He endorses "dizzy spells" that began several weeks ago. The dizzy spells are intermittent and occur with positional changes, walking, sitting. He feels like his head is spinning during spells, sometimes with nausea and blurred vision. Also with nightly headaches. Dizzy spells last anywhere from seconds to five minutes and will be intermittent throughout the day.   He's been taking Excedrin Migraine nightly for headaches, these do not feel like his typical migraines. He endorses drinking 4-5 bottles of water daily, coffee, Body Armor, fruit drinks. He endorses losing 15 pounds over the last month due to reduced calorie consumption and food choices since having teeth pulled. He will be following up with his dentist soon for dental plate placement. He walks during most of his shift at work which is 12 hours in duration.   He does not check his blood pressure at home. Denies chest pain.   BP Readings from Last 3 Encounters:  07/29/19 126/86  04/12/19 131/75  12/12/18 102/62   Wt Readings from Last 3 Encounters:  07/29/19 180 lb (81.6 kg)  04/12/19 184 lb (83.5 kg)  04/11/19 183 lb (83 kg)     Review of Systems  Constitutional: Negative for fever.  Eyes: Positive for visual disturbance.  Respiratory: Negative for shortness of breath.   Cardiovascular: Negative for chest pain.  Neurological: Positive for dizziness and headaches.       Past Medical History:  Diagnosis Date  . Asthma    AS A CHILD-NO INHALERS  . Cervical spine degeneration   . Chicken pox   . Cholelithiasis and acute cholecystitis without obstruction 12/08/2017  . Chronic back pain   . Frequent headaches   . GERD (gastroesophageal reflux disease)   . Migraine      Social History   Socioeconomic History  . Marital status: Single    Spouse name: Not on file  . Number of children: Not on file  . Years of education: Not on file  . Highest education level: Not on file  Occupational History  . Not on file  Social Needs  . Financial resource strain: Not on file  . Food insecurity    Worry: Not on file    Inability: Not on file  . Transportation needs    Medical: Not on file    Non-medical: Not on file  Tobacco Use  . Smoking status: Current Every Day Smoker    Packs/day: 0.25    Years: 20.00    Pack years: 5.00    Types: Cigarettes  . Smokeless tobacco: Never Used  Substance and Sexual Activity  . Alcohol use: Yes    Comment: 2 BEERS ON WEEKEND  . Drug use: Yes    Types: Marijuana    Comment: OCC-NOT EVERYDAY  . Sexual activity: Not on file  Lifestyle  . Physical activity    Days per week: Not on file    Minutes per session: Not on file  . Stress: Not on file  Relationships  . Social Musician on phone: Not on file    Gets together: Not on file    Attends religious service: Not on file    Active member of club or organization:  Not on file    Attends meetings of clubs or organizations: Not on file    Relationship status: Not on file  . Intimate partner violence    Fear of current or ex partner: Not on file    Emotionally abused: Not on file    Physically abused: Not on file    Forced sexual activity: Not on file  Other Topics Concern  . Not on file  Social History Narrative   Single.   4 children.   Works as a Armed forces technical officer.   Highest level of education: GED.    Past Surgical History:  Procedure Laterality Date  . CHOLECYSTECTOMY N/A 11/28/2017   Procedure: LAPAROSCOPIC CHOLECYSTECTOMY;  Surgeon: Vickie Epley, MD;  Location: [facility] ORS;  Service: General;  Laterality: N/A;  . NO PAST SURGERIES      Family History  Problem Relation Age of Onset  . Arthritis Mother   . Heart disease Mother   .  Hypertension Mother   . Alcohol abuse Father   . ALS Father   . Rheum arthritis Maternal Grandmother     Allergies  Allergen Reactions  . Hydrocodone-Acetaminophen Hives  . Sulfa Antibiotics Hives  . Sulfasalazine Hives  . Vicodin [Hydrocodone-Acetaminophen] Hives    Current Outpatient Medications on File Prior to Visit  Medication Sig Dispense Refill  . amLODipine (NORVASC) 10 MG tablet TAKE 1 TABLET(10 MG) BY MOUTH DAILY FOR BLOOD PRESSURE 90 tablet 3  . diclofenac (VOLTAREN) 75 MG EC tablet TAKE 1 TABLET(75 MG) BY MOUTH TWICE DAILY AS NEEDED FOR PAIN AND INFLAMMATION 180 tablet 0  . famotidine (PEPCID) 20 MG tablet Take 1 tablet (20 mg total) by mouth 2 (two) times daily. 60 tablet 0  . fluticasone (FLOVENT HFA) 110 MCG/ACT inhaler Inhale 1 puff into the lungs 2 (two) times a day. 3 Inhaler 0  . lisinopril-hydrochlorothiazide (PRINZIDE,ZESTORETIC) 20-25 MG tablet Take 1 tablet by mouth daily. For blood pressure. 90 tablet 3  . SUMAtriptan (IMITREX) 100 MG tablet Take 1 tablet at migraine onset. May repeat in 2 hours if no resolve. Do not exceed 2 tablets in 24 hours. 10 tablet 0  . traZODone (DESYREL) 100 MG tablet TAKE 1 TABLET(100 MG) BY MOUTH AT BEDTIME FOR SLEEP 90 tablet 1  . VENTOLIN HFA 108 (90 Base) MCG/ACT inhaler INHALE 2 PUFFS INTO THE LUNGS EVERY 6 HOURS AS NEEDED FOR WHEEZING OR SHORTNESS OF BREATH 18 g 0   No current facility-administered medications on file prior to visit.     BP 126/86   Pulse 77   Temp 98.8 F (37.1 C) (Temporal)   Ht 5' 6.5" (1.689 m)   Wt 180 lb (81.6 kg)   SpO2 97%   BMI 28.62 kg/m    Objective:   Physical Exam  Constitutional: He is oriented to person, place, and time. He appears well-nourished.  Eyes: EOM are normal.  Neck: Neck supple.  Cardiovascular: Normal rate and regular rhythm.  Respiratory: Effort normal and breath sounds normal.  Neurological: He is alert and oriented to person, place, and time. No cranial nerve deficit.   Skin: Skin is warm and dry.  Psychiatric: He has a normal mood and affect.           Assessment & Plan:

## 2019-07-29 NOTE — Addendum Note (Signed)
Addended by: Pleas Koch on: 07/29/2019 02:55 PM   Modules accepted: Orders

## 2019-07-29 NOTE — Addendum Note (Signed)
Addended by: Jacqualin Combes on: 07/29/2019 03:10 PM   Modules accepted: Orders

## 2019-07-29 NOTE — Assessment & Plan Note (Addendum)
Unclear etiology, doesn't seem to be vertigo but will keep on differentials list.  Negative orthostatic vital signs today. Weight doesn't seem drastically changed compared to office readings, down four pounds since June 2020.  Start with holding Amlodipine 10 mg, continue lisinopril-HCTZ. Check CBC and BMP.  He will update if symptoms persist. Consider ENT evaluation at that point.

## 2019-07-29 NOTE — Telephone Encounter (Signed)
Patient evaluated today

## 2019-07-30 ENCOUNTER — Emergency Department
Admission: EM | Admit: 2019-07-30 | Discharge: 2019-07-30 | Disposition: A | Discharge status: Home / Self Care | Payer: 59 | Attending: Emergency Medicine | Admitting: Emergency Medicine

## 2019-07-30 ENCOUNTER — Emergency Department: Payer: 59

## 2019-07-30 ENCOUNTER — Other Ambulatory Visit: Payer: Self-pay

## 2019-07-30 DIAGNOSIS — Z20828 Contact with and (suspected) exposure to other viral communicable diseases: Secondary | ICD-10-CM | POA: Insufficient documentation

## 2019-07-30 DIAGNOSIS — J45909 Unspecified asthma, uncomplicated: Secondary | ICD-10-CM | POA: Diagnosis not present

## 2019-07-30 DIAGNOSIS — I10 Essential (primary) hypertension: Secondary | ICD-10-CM | POA: Insufficient documentation

## 2019-07-30 DIAGNOSIS — F17210 Nicotine dependence, cigarettes, uncomplicated: Secondary | ICD-10-CM | POA: Diagnosis not present

## 2019-07-30 DIAGNOSIS — R42 Dizziness and giddiness: Secondary | ICD-10-CM | POA: Diagnosis not present

## 2019-07-30 DIAGNOSIS — R5383 Other fatigue: Secondary | ICD-10-CM | POA: Insufficient documentation

## 2019-07-30 DIAGNOSIS — R531 Weakness: Secondary | ICD-10-CM

## 2019-07-30 DIAGNOSIS — R05 Cough: Secondary | ICD-10-CM | POA: Insufficient documentation

## 2019-07-30 DIAGNOSIS — R079 Chest pain, unspecified: Secondary | ICD-10-CM | POA: Insufficient documentation

## 2019-07-30 DIAGNOSIS — R519 Headache, unspecified: Secondary | ICD-10-CM | POA: Insufficient documentation

## 2019-07-30 DIAGNOSIS — Z79899 Other long term (current) drug therapy: Secondary | ICD-10-CM | POA: Diagnosis not present

## 2019-07-30 LAB — CBC WITH DIFFERENTIAL/PLATELET
Abs Immature Granulocytes: 0.02 10*3/uL (ref 0.00–0.07)
Basophils Absolute: 0.1 10*3/uL (ref 0.0–0.1)
Basophils Relative: 1 %
Eosinophils Absolute: 0.9 10*3/uL — ABNORMAL HIGH (ref 0.0–0.5)
Eosinophils Relative: 11 %
HCT: 38.9 % — ABNORMAL LOW (ref 39.0–52.0)
Hemoglobin: 13.8 g/dL (ref 13.0–17.0)
Immature Granulocytes: 0 %
Lymphocytes Relative: 30 %
Lymphs Abs: 2.4 10*3/uL (ref 0.7–4.0)
MCH: 32.1 pg (ref 26.0–34.0)
MCHC: 35.5 g/dL (ref 30.0–36.0)
MCV: 90.5 fL (ref 80.0–100.0)
Monocytes Absolute: 1 10*3/uL (ref 0.1–1.0)
Monocytes Relative: 12 %
Neutro Abs: 3.7 10*3/uL (ref 1.7–7.7)
Neutrophils Relative %: 46 %
Platelets: 283 10*3/uL (ref 150–400)
RBC: 4.3 MIL/uL (ref 4.22–5.81)
RDW: 12.1 % (ref 11.5–15.5)
WBC: 8.1 10*3/uL (ref 4.0–10.5)
nRBC: 0 % (ref 0.0–0.2)

## 2019-07-30 LAB — COMPREHENSIVE METABOLIC PANEL
ALT: 31 U/L (ref 0–44)
AST: 18 U/L (ref 15–41)
Albumin: 3.7 g/dL (ref 3.5–5.0)
Alkaline Phosphatase: 45 U/L (ref 38–126)
Anion gap: 10 (ref 5–15)
BUN: 12 mg/dL (ref 6–20)
CO2: 27 mmol/L (ref 22–32)
Calcium: 9.1 mg/dL (ref 8.9–10.3)
Chloride: 102 mmol/L (ref 98–111)
Creatinine, Ser: 0.86 mg/dL (ref 0.61–1.24)
GFR calc Af Amer: >60 mL/min (ref >60)
GFR calc non Af Amer: >60 mL/min (ref >60)
Glucose, Bld: 114 mg/dL — ABNORMAL HIGH (ref 70–99)
Potassium: 3 mmol/L — ABNORMAL LOW (ref 3.5–5.1)
Sodium: 139 mmol/L (ref 135–145)
Total Bilirubin: 1.1 mg/dL (ref 0.3–1.2)
Total Protein: 6.1 g/dL — ABNORMAL LOW (ref 6.5–8.1)

## 2019-07-30 LAB — TROPONIN I (HIGH SENSITIVITY)
Troponin I (High Sensitivity): 2 ng/L (ref <18)
Troponin I (High Sensitivity): <2 ng/L (ref <18)

## 2019-07-30 LAB — URINALYSIS, COMPLETE (UACMP) WITH MICROSCOPIC
Bacteria, UA: NONE SEEN
Bilirubin Urine: NEGATIVE
Glucose, UA: NEGATIVE mg/dL
Hgb urine dipstick: NEGATIVE
Ketones, ur: NEGATIVE mg/dL
Leukocytes,Ua: NEGATIVE
Nitrite: NEGATIVE
Protein, ur: NEGATIVE mg/dL
Specific Gravity, Urine: >1.046 — ABNORMAL HIGH (ref 1.005–1.030)
Squamous Epithelial / HPF: NONE SEEN (ref 0–5)
WBC, UA: NONE SEEN WBC/hpf (ref 0–5)
pH: 7 (ref 5.0–8.0)

## 2019-07-30 LAB — URINE DRUG SCREEN, QUALITATIVE (ARMC ONLY)
Amphetamines, Ur Screen: NOT DETECTED
Barbiturates, Ur Screen: NOT DETECTED
Benzodiazepine, Ur Scrn: NOT DETECTED
Cannabinoid 50 Ng, Ur ~~LOC~~: POSITIVE — AB
Cocaine Metabolite,Ur ~~LOC~~: NOT DETECTED
MDMA (Ecstasy)Ur Screen: NOT DETECTED
Methadone Scn, Ur: NOT DETECTED
Opiate, Ur Screen: NOT DETECTED
Phencyclidine (PCP) Ur S: NOT DETECTED
Tricyclic, Ur Screen: NOT DETECTED

## 2019-07-30 LAB — BASIC METABOLIC PANEL
BUN: 10 mg/dL (ref 6–23)
CO2: 31 mEq/L (ref 19–32)
Calcium: 9.7 mg/dL (ref 8.4–10.5)
Chloride: 102 mEq/L (ref 96–112)
Creatinine, Ser: 0.96 mg/dL (ref 0.40–1.50)
GFR: 85.76 mL/min (ref >60.00)
Glucose, Bld: 89 mg/dL (ref 70–99)
Potassium: 4 mEq/L (ref 3.5–5.1)
Sodium: 140 mEq/L (ref 135–145)

## 2019-07-30 LAB — CBC
HCT: 44.3 % (ref 39.0–52.0)
Hemoglobin: 15.3 g/dL (ref 13.0–17.0)
MCHC: 34.5 g/dL (ref 30.0–36.0)
MCV: 93.6 fl (ref 78.0–100.0)
Platelets: 316 10*3/uL (ref 150.0–400.0)
RBC: 4.73 Mil/uL (ref 4.22–5.81)
RDW: 12.9 % (ref 11.5–15.5)
WBC: 7.8 10*3/uL (ref 4.0–10.5)

## 2019-07-30 LAB — SARS CORONAVIRUS 2 BY RT PCR (HOSPITAL ORDER, PERFORMED IN ~~LOC~~ HOSPITAL LAB): SARS Coronavirus 2: NEGATIVE

## 2019-07-30 MED ORDER — SODIUM CHLORIDE 0.9 % IV BOLUS
1000.0000 mL | Freq: Once | INTRAVENOUS | Status: AC
  Administered 2019-07-30: 1000 mL via INTRAVENOUS

## 2019-07-30 MED ORDER — IOHEXOL 350 MG/ML SOLN
75.0000 mL | Freq: Once | INTRAVENOUS | Status: AC | PRN
  Administered 2019-07-30: 20:00:00 75 mL via INTRAVENOUS

## 2019-07-30 NOTE — ED Notes (Signed)
Patient transported to MRI at this time. 

## 2019-07-30 NOTE — ED Provider Notes (Signed)
[facility] Emergency Department Provider Note   ____________________________________________   First MD Initiated Contact with Patient 07/30/19 1838     (approximate)  I have reviewed the triage vital signs and the nursing notes.   HISTORY  Chief Complaint Chest Pain and Fatigue   HPI Billee CashingBrian E Ropp is a 43 y.o. male patient reports he is having chest pain.  The chest pain is a squeezing tightness that lasts 3 to 4 seconds and resolves and then comes back again.  He is not having any fever.  He is having a slight cough.  This is nonproductive.  He has not had any actual pain only the squeezing tightness.  Patient reports he is feeling tired and a little dizzy.  The dizziness is lightheadedness and spinning that started 2 days ago.  Patient's O2 sats were in the low 90s at the doctor's office.      Past Medical History:  Diagnosis Date  . Asthma    AS A CHILD-NO INHALERS  . Cervical spine degeneration   . Chicken pox   . Cholelithiasis and acute cholecystitis without obstruction 12/08/2017  . Chronic back pain   . Frequent headaches   . GERD (gastroesophageal reflux disease)   . Migraine     Patient Active Problem List   Diagnosis Date Noted  . Dizziness 07/29/2019  . Suspected COVID-19 virus infection 04/11/2019  . GERD (gastroesophageal reflux disease) 02/22/2019  . Asthma 02/22/2019  . 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 Insomnia 08/14/2018  . Preventative health care 08/14/2018  . Essential hypertension 11/17/2017  . History of cholecystectomy 11/13/2017  . Migraines 06/01/2016  . Chronic back pain 06/01/2016  . Chronic neck pain 06/01/2016    Past Surgical History:  Procedure Laterality Date  . CHOLECYSTECTOMY N/A 11/28/2017   Procedure: LAPAROSCOPIC CHOLECYSTECTOMY;  Surgeon: Ancil Linseyavis, Jason Evan, MD;  Location: [facility] ORS;  Service: General;  Laterality: N/A;  . NO PAST SURGERIES      Prior to Admission medications   Medication Sig  Start Date End Date Taking? Authorizing Provider  diclofenac (VOLTAREN) 75 MG EC tablet TAKE 1 TABLET(75 MG) BY MOUTH TWICE DAILY AS NEEDED FOR PAIN AND INFLAMMATION 05/13/19   Lorre MunroeBaity, Regina W, NP  famotidine (PEPCID) 20 MG tablet Take 1 tablet (20 mg total) by mouth 2 (two) times daily. 08/29/18   Sharman CheekStafford, Phillip, MD  fluticasone (FLOVENT HFA) 110 MCG/ACT inhaler Inhale 1 puff into the lungs 2 (two) times a day. 03/15/19   Doreene Nestlark, Katherine K, NP  lisinopril-hydrochlorothiazide (PRINZIDE,ZESTORETIC) 20-25 MG tablet Take 1 tablet by mouth daily. For blood pressure. 09/25/18   Doreene Nestlark, Katherine K, NP  SUMAtriptan (IMITREX) 100 MG tablet Take 1 tablet at migraine onset. May repeat in 2 hours if no resolve. Do not exceed 2 tablets in 24 hours. 09/25/18   Doreene Nestlark, Katherine K, NP  traZODone (DESYREL) 100 MG tablet TAKE 1 TABLET(100 MG) BY MOUTH AT BEDTIME FOR SLEEP 03/22/19   Doreene Nestlark, Katherine K, NP  VENTOLIN HFA 108 (90 Base) MCG/ACT inhaler INHALE 2 PUFFS INTO THE LUNGS EVERY 6 HOURS AS NEEDED FOR WHEEZING OR SHORTNESS OF BREATH 04/16/19   Doreene Nestlark, Katherine K, NP    Allergies Hydrocodone-acetaminophen, Sulfa antibiotics, Sulfasalazine, and Vicodin [hydrocodone-acetaminophen]  Family History  Problem Relation Age of Onset  . Arthritis Mother   . Heart disease Mother   . Hypertension Mother   . Alcohol abuse Father   . ALS Father   . Rheum arthritis Maternal Grandmother  Social History Social History   Tobacco Use  . Smoking status: Current Every Day Smoker    Packs/day: 0.25    Years: 20.00    Pack years: 5.00    Types: Cigarettes  . Smokeless tobacco: Never Used  Substance Use Topics  . Alcohol use: Yes    Comment: 2 BEERS ON WEEKEND  . Drug use: Yes    Types: Marijuana    Comment: OCC-NOT EVERYDAY    Review of Systems  Constitutional: No fever/chills Eyes: No visual changes. ENT: No sore throat. Cardiovascular: chest pain. Respiratory: shortness of breath. Gastrointestinal: No  abdominal pain.  No nausea, no vomiting.  No diarrhea.  No constipation. Genitourinary: Negative for dysuria. Musculoskeletal: Negative for back pain. Skin: Negative for rash. Neurological: Negative for headaches, focal weakness   ____________________________________________   PHYSICAL EXAM:  VITAL SIGNS: ED Triage Vitals  Enc Vitals Group     BP 07/30/19 1843 113/61     Pulse Rate 07/30/19 1843 69     Resp 07/30/19 1843 20     Temp 07/30/19 1843 98.3 F (36.8 C)     Temp Source 07/30/19 1843 Oral     SpO2 07/30/19 1842 97 %     Weight 07/30/19 1844 179 lb 14.3 oz (81.6 kg)     Height 07/30/19 1844 5\' 7"  (1.702 m)     Head Circumference --      Peak Flow --      Pain Score 07/30/19 1844 9     Pain Loc --      Pain Edu? --      Excl. in [place]? --    Constitutional: Alert and oriented. Well appearing and in no acute distress. Eyes: Conjunctivae are normal. PERRL. EOMI. no nystagmus.  Patient reports dizziness when I turn his head to the right but there is still no nystagmus. Head: Atraumatic. Nose: No congestion/rhinnorhea. Mouth/Throat: Mucous membranes are moist.  Oropharynx non-erythematous. Neck: No stridor.  Cardiovascular: Normal rate, regular rhythm. Grossly normal heart sounds.  Good peripheral circulation. Respiratory: Normal respiratory effort.  No retractions. Lungs CTAB. Gastrointestinal: Soft and nontender. No distention. No abdominal bruits. No CVA tenderness. Musculoskeletal: No lower extremity tenderness nor edema.   Neurologic:  Normal speech and language. No gross focal neurologic deficits are appreciated. No gait instability. Skin:  Skin is warm, dry and intact. No rash noted.   ____________________________________________   LABS (all labs ordered are listed, but only abnormal results are displayed)  Labs Reviewed  COMPREHENSIVE METABOLIC PANEL - Abnormal; Notable for the following components:      Result Value   Potassium 3.0 (*)    Glucose, Bld 114  (*)    Total Protein 6.1 (*)    All other components within normal limits  CBC WITH DIFFERENTIAL/PLATELET - Abnormal; Notable for the following components:   HCT 38.9 (*)    Eosinophils Absolute 0.9 (*)    All other components within normal limits  SARS CORONAVIRUS 2 (HOSPITAL ORDER, [facility])  URINE DRUG SCREEN, QUALITATIVE ([facility] ONLY)  URINALYSIS, COMPLETE ([facility]) WITH MICROSCOPIC  TROPONIN I (HIGH SENSITIVITY)  TROPONIN I (HIGH SENSITIVITY)   ____________________________________________  EKG  EKG read interpreted by me shows normal sinus rhythm rate of 72 normal axis no acute ST-T wave changes ____________________________________________  RADIOLOGY  ED MD interpretation:    Official radiology report(s): Ct Angio Chest Pe W And/or Wo Contrast  Result Date: 07/30/2019 CLINICAL DATA:  Generalized central chest pain today. EXAM:  CT ANGIOGRAPHY CHEST WITH CONTRAST TECHNIQUE: Multidetector CT imaging of the chest was performed using the standard protocol during bolus administration of intravenous contrast. Multiplanar CT image reconstructions and MIPs were obtained to evaluate the vascular anatomy. CONTRAST:  34mL OMNIPAQUE IOHEXOL 350 MG/ML SOLN COMPARISON:  November 13, 2017 FINDINGS: Cardiovascular: Satisfactory opacification of the pulmonary arteries to the segmental level. No evidence of pulmonary embolism. Normal heart size. No pericardial effusion. Mediastinum/Nodes: No enlarged mediastinal, hilar, or axillary lymph nodes. Thyroid gland, trachea, and esophagus demonstrate no significant findings. Lungs/Pleura: Mild dependent atelectasis of posterior lung bases are noted. There is no focal pneumonia, pleural effusion. No pneumothorax is noted. Upper Abdomen: Prior cholecystectomy. The other visualized upper abdominal structures are unremarkable. Musculoskeletal: No acute abnormality. Review of the MIP images confirms the above findings. IMPRESSION: No  pulmonary embolus Mild atelectasis of bilateral lung bases. Otherwise no acute abnormality identified in the chest. Electronically Signed   By: Sherian Rein M.D.   On: 07/30/2019 20:03   Dg Chest Portable 1 View  Result Date: 07/30/2019 CLINICAL DATA:  Weakness, chest pain EXAM: PORTABLE CHEST 1 VIEW COMPARISON:  04/12/2019 chest radiograph. FINDINGS: Stable cardiomediastinal silhouette with normal heart size. No pneumothorax. No pleural effusion. Lungs appear clear, with no acute consolidative airspace disease and no pulmonary edema. IMPRESSION: No active disease. Electronically Signed   By: Delbert Phenix M.D.   On: 07/30/2019 19:40    ____________________________________________   PROCEDURES  Procedure(s) performed (including Critical Care):  Procedures   ____________________________________________   INITIAL IMPRESSION / ASSESSMENT AND PLAN / ED COURSE  BIANCA VESTER was evaluated in Emergency Department on 07/30/2019 for the symptoms described in the history of present illness. He was evaluated in the context of the global COVID-19 pandemic, which necessitated consideration that the patient might be at risk for infection with the SARS-CoV-2 virus that causes COVID-19. Institutional protocols and algorithms that pertain to the evaluation of patients at risk for COVID-19 are in a state of rapid change based on information released by regulatory bodies including the CDC and federal and state organizations. These policies and algorithms were followed during the patient's care in the ED.    Patient complains of chest pain some nausea a lot of fatigue with vertigo.  I have ordered an MRI of the head and some fluids.  Also drug screen is pending.  I do not anticipate this being back anytime soon.  I will sign the patient out to Dr. Lenard Lance     Clinical Course as of Jul 30 2107  Tue Jul 30, 2019  2034 CO2: 27 [PM]    Clinical Course User Index [PM] Arnaldo Natal, MD      ____________________________________________   FINAL CLINICAL IMPRESSION(S) / ED DIAGNOSES  Final diagnoses:  Weakness     ED Discharge Orders    None       Note:  This document was prepared using Dragon voice recognition software and may include unintentional dictation errors.    Arnaldo Natal, MD 07/30/19 2108

## 2019-07-30 NOTE — ED Provider Notes (Signed)
-----------------------------------------   10:50 PM on 07/30/2019 -----------------------------------------  Patient care assumed from Dr. Cinda Quest.  The remainder the patient's work-up has resulted largely within normal limits.  Patient's urine drug screen positive for cannabinoids only.  Blood work largely within normal limits including a negative troponin and a negative COVID test.  MRI of the brain is normal.  CT scan of the chest is normal.  Given the overall normal work-up I believe the patient will be safe for discharge home with PCP follow-up.   Harvest Dark, MD 07/30/19 2250

## 2019-07-30 NOTE — ED Triage Notes (Signed)
Pt to ED via [facility] from home for complaint of centralized chest paint that started at 1500 today and lethargy/dizziness that started 2 days ago. Saw PCP yesterday d/t lethargy and dizziness, stopped amlodipine, continued regimen of lisinopril for HTN.  EMS reports hypotensive, and desat in low 90's, 324 mg aspirin given before arrival.  Pt appears in NAD now, o2 sat 97% on 3L [place], lethargic.

## 2019-07-30 NOTE — ED Notes (Signed)
Pt denies any c/o CP at this time, but does report a 5/10 pain headache.

## 2019-08-02 ENCOUNTER — Encounter: Payer: Self-pay | Admitting: Primary Care

## 2019-08-02 ENCOUNTER — Other Ambulatory Visit: Payer: Self-pay

## 2019-08-02 ENCOUNTER — Ambulatory Visit (INDEPENDENT_AMBULATORY_CARE_PROVIDER_SITE_OTHER): Payer: 59 | Admitting: Primary Care

## 2019-08-02 VITALS — BP 136/80 | HR 62 | Temp 97.8°F | Ht 66.5 in | Wt 180.0 lb

## 2019-08-02 DIAGNOSIS — R42 Dizziness and giddiness: Secondary | ICD-10-CM | POA: Diagnosis not present

## 2019-08-02 NOTE — Patient Instructions (Addendum)
You will be contacted regarding your referral to ENT for dizziness.  Please let us know if you have not been contacted within one week.   Try Meclizine medication over the counter for vertigo symptoms. This may cause drowsiness so start with 1/2 tablet.   Start monitoring your blood pressure daily, around the same time of day, for the next 2-3 weeks.  Ensure that you have rested for 30 minutes prior to checking your blood pressure. Record your readings and send them to me via my chart in a few weeks.  Continue off of Amlodipine for blood pressure for now.  It was a pleasure to see you today!

## 2019-08-02 NOTE — Progress Notes (Signed)
Subjective:    Patient ID: Zachary Khan, male    DOB: 1976-03-28, 43 y.o.   MRN: [id number]  HPI  Zachary Khan is a 43 year old male with a history of GERD, Asthma, hypertension, migraines, chronic neck and back pain, dizziness who presents today for ED follow up.  He presented to our office on 07/29/19 with complaints of dizziness/dizzy spells that occurred consistently but worse with positional changes. Head and room spinning sensation. During that visit we held his Amlodipine 10 mg, continued lisinopril-HCTZ. Negative orthostatic vitals. He was told to follow up if symptoms persisted.   He presented to [facility] ED on 07/30/19 with a chief complaint of chest pain and fatigue. Also with reports of dizziness with lightheadedness that began two days prior.  During his stay in the ED he tested negative for Covid-19. He underwent MRI of the head which was unremarkable. UDS positive for cannabinoids only. Labs "largely within normal limits" including troponin. He also underwent CT chest which was normal. Given normal work up he was discharged home.  BP Readings from Last 3 Encounters:  08/02/19 136/80  07/30/19 122/74  07/29/19 126/86   Since discharge home he continues to feel dizziness mostly all day, worse with positional changes. Sometimes the room is spinning. He has not tried anything OTC for symptoms. He has not been to work in 2 weeks due to dizziness that he feels during the day. His work is "giving me a hard time for being out".  He works in a Engineer, materials during the day and doesn't feel safe working in that environment. He cannot bend over or make quick movements as he will feel unsteady.   Review of Systems  Constitutional: Negative for unexpected weight change.  Eyes: Negative for visual disturbance.  Respiratory: Negative for shortness of breath.   Cardiovascular: Negative for chest pain.  Neurological: Positive for dizziness.       Past Medical History:  Diagnosis Date  .  Asthma    AS A CHILD-NO INHALERS  . Cervical spine degeneration   . Chicken pox   . Cholelithiasis and acute cholecystitis without obstruction 12/08/2017  . Chronic back pain   . Frequent headaches   . GERD (gastroesophageal reflux disease)   . Migraine      Social History   Socioeconomic History  . Marital status: Single    Spouse name: Not on file  . Number of children: Not on file  . Years of education: Not on file  . Highest education level: Not on file  Occupational History  . Not on file  Social Needs  . Financial resource strain: Not on file  . Food insecurity    Worry: Not on file    Inability: Not on file  . Transportation needs    Medical: Not on file    Non-medical: Not on file  Tobacco Use  . Smoking status: Current Every Day Smoker    Packs/day: 0.25    Years: 20.00    Pack years: 5.00    Types: Cigarettes  . Smokeless tobacco: Never Used  Substance and Sexual Activity  . Alcohol use: Yes    Comment: 2 BEERS ON WEEKEND  . Drug use: Yes    Types: Marijuana    Comment: OCC-NOT EVERYDAY  . Sexual activity: Not on file  Lifestyle  . Physical activity    Days per week: Not on file    Minutes per session: Not on file  . Stress:  Not on file  Relationships  . Social Musician on phone: Not on file    Gets together: Not on file    Attends religious service: Not on file    Active member of club or organization: Not on file    Attends meetings of clubs or organizations: Not on file    Relationship status: Not on file  . Intimate partner violence    Fear of current or ex partner: Not on file    Emotionally abused: Not on file    Physically abused: Not on file    Forced sexual activity: Not on file  Other Topics Concern  . Not on file  Social History Narrative   Single.   4 children.   Works as a Retail banker.   Highest level of education: GED.    Past Surgical History:  Procedure Laterality Date  . CHOLECYSTECTOMY N/A 11/28/2017    Procedure: LAPAROSCOPIC CHOLECYSTECTOMY;  Surgeon: Ancil Linsey, MD;  Location: [facility] ORS;  Service: General;  Laterality: N/A;  . NO PAST SURGERIES      Family History  Problem Relation Age of Onset  . Arthritis Mother   . Heart disease Mother   . Hypertension Mother   . Alcohol abuse Father   . ALS Father   . Rheum arthritis Maternal Grandmother     Allergies  Allergen Reactions  . Hydrocodone-Acetaminophen Hives  . Sulfa Antibiotics Hives  . Sulfasalazine Hives  . Vicodin [Hydrocodone-Acetaminophen] Hives    Current Outpatient Medications on File Prior to Visit  Medication Sig Dispense Refill  . diclofenac (VOLTAREN) 75 MG EC tablet TAKE 1 TABLET(75 MG) BY MOUTH TWICE DAILY AS NEEDED FOR PAIN AND INFLAMMATION 180 tablet 0  . famotidine (PEPCID) 20 MG tablet Take 1 tablet (20 mg total) by mouth 2 (two) times daily. 60 tablet 0  . fluticasone (FLOVENT HFA) 110 MCG/ACT inhaler Inhale 1 puff into the lungs 2 (two) times a day. 3 Inhaler 0  . lisinopril-hydrochlorothiazide (PRINZIDE,ZESTORETIC) 20-25 MG tablet Take 1 tablet by mouth daily. For blood pressure. 90 tablet 3  . SUMAtriptan (IMITREX) 100 MG tablet Take 1 tablet at migraine onset. May repeat in 2 hours if no resolve. Do not exceed 2 tablets in 24 hours. 10 tablet 0  . traZODone (DESYREL) 100 MG tablet TAKE 1 TABLET(100 MG) BY MOUTH AT BEDTIME FOR SLEEP 90 tablet 1  . VENTOLIN HFA 108 (90 Base) MCG/ACT inhaler INHALE 2 PUFFS INTO THE LUNGS EVERY 6 HOURS AS NEEDED FOR WHEEZING OR SHORTNESS OF BREATH 18 g 0   No current facility-administered medications on file prior to visit.     BP 136/80   Pulse 62   Temp 97.8 F (36.6 C) (Temporal)   Ht 5' 6.5" (1.689 m)   Wt 180 lb (81.6 kg)   SpO2 97%   BMI 28.62 kg/m    Objective:   Physical Exam  Constitutional: He is oriented to person, place, and time. He appears well-nourished.  Eyes: EOM are normal.  Neck: Neck supple.  Cardiovascular: Normal rate and regular  rhythm.  Respiratory: Effort normal and breath sounds normal.  Neurological: He is alert and oriented to person, place, and time. No cranial nerve deficit.  Skin: Skin is warm and dry.  Psychiatric: He has a normal mood and affect.           Assessment & Plan:

## 2019-08-02 NOTE — Assessment & Plan Note (Signed)
Continued despite removal of Amlodipine 10 mg.   Lower suspicion that BP meds to have been causing his dizziness as he is now borderline hypertensive off of Amlodipine and had negative orthostatic vitals during our visit.  Work up in the ED on 07/30/19 including MRI of the brain was grossly unremarkable. Notes, labs, imaging reviewed from hospital visit.  Continue off of Amlodipine for now. Will have patient start monitoring home BP as he has not. Referral placed to ENT for further evaluation, MRI of brain negative. Trial of Meclizine PRN.  Work note provided. He will look into intermittent FMLA through his occupation as we will grant this temporarily.

## 2019-08-05 ENCOUNTER — Other Ambulatory Visit: Payer: Self-pay

## 2019-08-05 ENCOUNTER — Inpatient Hospital Stay
Admission: EM | Admit: 2019-08-05 | Discharge: 2019-08-07 | DRG: 580 | Disposition: A | Discharge status: Home Health | Payer: 59 | Attending: Internal Medicine | Admitting: Internal Medicine

## 2019-08-05 ENCOUNTER — Encounter: Payer: Self-pay | Admitting: Emergency Medicine

## 2019-08-05 ENCOUNTER — Emergency Department: Payer: 59

## 2019-08-05 DIAGNOSIS — Z79899 Other long term (current) drug therapy: Secondary | ICD-10-CM | POA: Diagnosis not present

## 2019-08-05 DIAGNOSIS — K219 Gastro-esophageal reflux disease without esophagitis: Secondary | ICD-10-CM | POA: Diagnosis present

## 2019-08-05 DIAGNOSIS — J45909 Unspecified asthma, uncomplicated: Secondary | ICD-10-CM | POA: Diagnosis present

## 2019-08-05 DIAGNOSIS — L03116 Cellulitis of left lower limb: Secondary | ICD-10-CM

## 2019-08-05 DIAGNOSIS — I10 Essential (primary) hypertension: Secondary | ICD-10-CM | POA: Diagnosis present

## 2019-08-05 DIAGNOSIS — Z1639 Resistance to other specified antimicrobial drug: Secondary | ICD-10-CM | POA: Diagnosis present

## 2019-08-05 DIAGNOSIS — F17210 Nicotine dependence, cigarettes, uncomplicated: Secondary | ICD-10-CM | POA: Diagnosis present

## 2019-08-05 DIAGNOSIS — Z8261 Family history of arthritis: Secondary | ICD-10-CM

## 2019-08-05 DIAGNOSIS — G43909 Migraine, unspecified, not intractable, without status migrainosus: Secondary | ICD-10-CM | POA: Diagnosis present

## 2019-08-05 DIAGNOSIS — W57XXXA Bitten or stung by nonvenomous insect and other nonvenomous arthropods, initial encounter: Secondary | ICD-10-CM | POA: Diagnosis present

## 2019-08-05 DIAGNOSIS — Z811 Family history of alcohol abuse and dependence: Secondary | ICD-10-CM | POA: Diagnosis not present

## 2019-08-05 DIAGNOSIS — Z20828 Contact with and (suspected) exposure to other viral communicable diseases: Secondary | ICD-10-CM | POA: Diagnosis present

## 2019-08-05 DIAGNOSIS — B9562 Methicillin resistant Staphylococcus aureus infection as the cause of diseases classified elsewhere: Secondary | ICD-10-CM | POA: Diagnosis present

## 2019-08-05 DIAGNOSIS — Z882 Allergy status to sulfonamides status: Secondary | ICD-10-CM | POA: Diagnosis not present

## 2019-08-05 DIAGNOSIS — G8929 Other chronic pain: Secondary | ICD-10-CM | POA: Diagnosis present

## 2019-08-05 DIAGNOSIS — M549 Dorsalgia, unspecified: Secondary | ICD-10-CM | POA: Diagnosis present

## 2019-08-05 DIAGNOSIS — L02416 Cutaneous abscess of left lower limb: Secondary | ICD-10-CM | POA: Diagnosis present

## 2019-08-05 DIAGNOSIS — Z8619 Personal history of other infectious and parasitic diseases: Secondary | ICD-10-CM

## 2019-08-05 DIAGNOSIS — Z8249 Family history of ischemic heart disease and other diseases of the circulatory system: Secondary | ICD-10-CM | POA: Diagnosis not present

## 2019-08-05 DIAGNOSIS — Z885 Allergy status to narcotic agent status: Secondary | ICD-10-CM | POA: Diagnosis not present

## 2019-08-05 DIAGNOSIS — D72828 Other elevated white blood cell count: Secondary | ICD-10-CM

## 2019-08-05 LAB — CBC WITH DIFFERENTIAL/PLATELET
Abs Immature Granulocytes: 0.07 10*3/uL (ref 0.00–0.07)
Basophils Absolute: 0.1 10*3/uL (ref 0.0–0.1)
Basophils Relative: 1 %
Eosinophils Absolute: 1.1 10*3/uL — ABNORMAL HIGH (ref 0.0–0.5)
Eosinophils Relative: 6 %
HCT: 42.4 % (ref 39.0–52.0)
Hemoglobin: 14.8 g/dL (ref 13.0–17.0)
Immature Granulocytes: 0 %
Lymphocytes Relative: 21 %
Lymphs Abs: 3.8 10*3/uL (ref 0.7–4.0)
MCH: 31.5 pg (ref 26.0–34.0)
MCHC: 34.9 g/dL (ref 30.0–36.0)
MCV: 90.2 fL (ref 80.0–100.0)
Monocytes Absolute: 1.7 10*3/uL — ABNORMAL HIGH (ref 0.1–1.0)
Monocytes Relative: 10 %
Neutro Abs: 11.3 10*3/uL — ABNORMAL HIGH (ref 1.7–7.7)
Neutrophils Relative %: 62 %
Platelets: 310 10*3/uL (ref 150–400)
RBC: 4.7 MIL/uL (ref 4.22–5.81)
RDW: 11.9 % (ref 11.5–15.5)
WBC: 18.1 10*3/uL — ABNORMAL HIGH (ref 4.0–10.5)
nRBC: 0 % (ref 0.0–0.2)

## 2019-08-05 LAB — BASIC METABOLIC PANEL
Anion gap: 9 (ref 5–15)
BUN: 12 mg/dL (ref 6–20)
CO2: 27 mmol/L (ref 22–32)
Calcium: 9.1 mg/dL (ref 8.9–10.3)
Chloride: 103 mmol/L (ref 98–111)
Creatinine, Ser: 0.89 mg/dL (ref 0.61–1.24)
GFR calc Af Amer: >60 mL/min (ref >60)
GFR calc non Af Amer: >60 mL/min (ref >60)
Glucose, Bld: 91 mg/dL (ref 70–99)
Potassium: 3.1 mmol/L — ABNORMAL LOW (ref 3.5–5.1)
Sodium: 139 mmol/L (ref 135–145)

## 2019-08-05 LAB — SARS CORONAVIRUS 2 (TAT 6-24 HRS): SARS Coronavirus 2: NEGATIVE

## 2019-08-05 LAB — LACTIC ACID, PLASMA
Lactic Acid, Venous: 1.4 mmol/L (ref 0.5–1.9)
Lactic Acid, Venous: 1.1 mmol/L (ref 0.5–1.9)

## 2019-08-05 LAB — MAGNESIUM: Magnesium: 2 mg/dL (ref 1.7–2.4)

## 2019-08-05 MED ORDER — HYDROMORPHONE HCL 1 MG/ML IJ SOLN: 1.0000 mg | INTRAMUSCULAR | Status: DC | PRN

## 2019-08-05 MED ORDER — SODIUM CHLORIDE 0.9 % IV SOLN
INTRAVENOUS | Status: DC
  Administered 2019-08-05 – 2019-08-06 (×4): via INTRAVENOUS

## 2019-08-05 MED ORDER — HYDROMORPHONE HCL 1 MG/ML IJ SOLN
1.0000 mg | Freq: Once | INTRAMUSCULAR | Status: AC
  Administered 2019-08-05: 1 mg via INTRAVENOUS
  Filled 2019-08-05: qty 1

## 2019-08-05 MED ORDER — VITAMIN C 500 MG PO TABS
250.0000 mg | ORAL_TABLET | Freq: Every day | ORAL | Status: DC
  Administered 2019-08-05 – 2019-08-07 (×3): 250 mg via ORAL
  Filled 2019-08-05 (×3): qty 1
  Filled 2019-08-05: qty 0.5

## 2019-08-05 MED ORDER — LISINOPRIL-HYDROCHLOROTHIAZIDE 20-25 MG PO TABS: 1.0000 | ORAL_TABLET | Freq: Every day | ORAL | Status: DC

## 2019-08-05 MED ORDER — FAMOTIDINE 20 MG PO TABS
20.0000 mg | ORAL_TABLET | Freq: Two times a day (BID) | ORAL | Status: DC
  Administered 2019-08-05 – 2019-08-07 (×5): 20 mg via ORAL
  Filled 2019-08-05 (×5): qty 1

## 2019-08-05 MED ORDER — TRAZODONE HCL 50 MG PO TABS
100.0000 mg | ORAL_TABLET | Freq: Every evening | ORAL | Status: DC | PRN
  Administered 2019-08-07: 100 mg via ORAL
  Filled 2019-08-05: qty 2

## 2019-08-05 MED ORDER — VANCOMYCIN HCL 10 G IV SOLR
1250.0000 mg | Freq: Two times a day (BID) | INTRAVENOUS | Status: DC
  Administered 2019-08-05: 21:00:00 1250 mg via INTRAVENOUS
  Filled 2019-08-05 (×3): qty 1250

## 2019-08-05 MED ORDER — ALBUTEROL SULFATE (2.5 MG/3ML) 0.083% IN NEBU: 3.0000 mL | INHALATION_SOLUTION | RESPIRATORY_TRACT | Status: DC | PRN

## 2019-08-05 MED ORDER — SUMATRIPTAN SUCCINATE 50 MG PO TABS
100.0000 mg | ORAL_TABLET | Freq: Every day | ORAL | Status: DC | PRN
  Administered 2019-08-05: 21:00:00 100 mg via ORAL
  Filled 2019-08-05 (×3): qty 2

## 2019-08-05 MED ORDER — LISINOPRIL 20 MG PO TABS
20.0000 mg | ORAL_TABLET | Freq: Every day | ORAL | Status: DC
  Administered 2019-08-05 – 2019-08-07 (×3): 20 mg via ORAL
  Filled 2019-08-05 (×3): qty 1

## 2019-08-05 MED ORDER — KETOROLAC TROMETHAMINE 30 MG/ML IJ SOLN
15.0000 mg | Freq: Once | INTRAMUSCULAR | Status: AC
  Administered 2019-08-05: 15 mg via INTRAVENOUS
  Filled 2019-08-05: qty 1

## 2019-08-05 MED ORDER — HYDROCHLOROTHIAZIDE 25 MG PO TABS
25.0000 mg | ORAL_TABLET | Freq: Every day | ORAL | Status: DC
  Administered 2019-08-05 – 2019-08-07 (×3): 25 mg via ORAL
  Filled 2019-08-05 (×3): qty 1

## 2019-08-05 MED ORDER — ENOXAPARIN SODIUM 40 MG/0.4ML ~~LOC~~ SOLN
40.0000 mg | SUBCUTANEOUS | Status: DC
  Administered 2019-08-05 – 2019-08-07 (×3): 40 mg via SUBCUTANEOUS
  Filled 2019-08-05 (×3): qty 0.4

## 2019-08-05 MED ORDER — PIPERACILLIN-TAZOBACTAM 3.375 G IVPB
3.3750 g | Freq: Three times a day (TID) | INTRAVENOUS | Status: DC
  Administered 2019-08-05 – 2019-08-07 (×6): 3.375 g via INTRAVENOUS
  Filled 2019-08-05 (×6): qty 50

## 2019-08-05 MED ORDER — LIDOCAINE-EPINEPHRINE (PF) 2 %-1:200000 IJ SOLN
20.0000 mL | Freq: Once | INTRAMUSCULAR | Status: AC
  Administered 2019-08-05: 20 mL via INTRADERMAL
  Filled 2019-08-05: qty 20

## 2019-08-05 MED ORDER — VANCOMYCIN HCL 10 G IV SOLR
2000.0000 mg | Freq: Once | INTRAVENOUS | Status: AC
  Administered 2019-08-05: 2000 mg via INTRAVENOUS
  Filled 2019-08-05: qty 2000

## 2019-08-05 MED ORDER — SODIUM CHLORIDE 0.9 % IV SOLN
2.0000 g | Freq: Once | INTRAVENOUS | Status: AC
  Administered 2019-08-05: 2 g via INTRAVENOUS
  Filled 2019-08-05: qty 20

## 2019-08-05 MED ORDER — OXYCODONE-ACETAMINOPHEN 5-325 MG PO TABS
1.0000 | ORAL_TABLET | ORAL | Status: DC | PRN
  Administered 2019-08-05 – 2019-08-06 (×6): 1 via ORAL
  Filled 2019-08-05 (×6): qty 1

## 2019-08-05 MED ORDER — POTASSIUM CHLORIDE CRYS ER 20 MEQ PO TBCR
40.0000 meq | EXTENDED_RELEASE_TABLET | Freq: Once | ORAL | Status: AC
  Administered 2019-08-05: 40 meq via ORAL
  Filled 2019-08-05: qty 2

## 2019-08-05 MED ORDER — ADULT MULTIVITAMIN W/MINERALS CH
1.0000 | ORAL_TABLET | Freq: Every day | ORAL | Status: DC
  Administered 2019-08-05 – 2019-08-07 (×3): 1 via ORAL
  Filled 2019-08-05 (×3): qty 1

## 2019-08-05 MED ORDER — SODIUM CHLORIDE 0.9 % IV BOLUS
1000.0000 mL | Freq: Once | INTRAVENOUS | Status: AC
  Administered 2019-08-05: 09:00:00 1000 mL via INTRAVENOUS

## 2019-08-05 NOTE — Consult Note (Signed)
PHARMACY -  BRIEF ANTIBIOTIC NOTE   Pharmacy has received consult(s) for Vancomycin  from an ED provider.  The patient's profile has been reviewed for ht/wt/allergies/indication/available labs.    One time order(s) placed for Vancomycin 2g x1 dose was ordered.   Further antibiotics/pharmacy consults should be ordered by admitting physician if indicated.                       Thank you, Rowland Lathe 08/05/2019  7:30 AM

## 2019-08-05 NOTE — ED Notes (Signed)
ED TO INPATIENT HANDOFF REPORT  ED Nurse Name and Phone #: Corrie Dandy [phone]   S Name/Age/Gender Zachary Khan 43 y.o. male Room/Bed: ED12A/ED12A  Code Status   Code Status: Full Code  Home/SNF/Other Home Patient oriented to: self, place, time and situation Is this baseline? Yes   Triage Complete: Triage complete  Chief Complaint insect bite l calf  Triage Note Pt presents tonight with possible insect bite to left calf; noticed area starting to turn red on Friday night; now with large around of redness to entire calf area; calf swollen and arm to touch; very tender; pt denies fever at home   Allergies Allergies  Allergen Reactions  . Hydrocodone-Acetaminophen Hives  . Sulfa Antibiotics Hives  . Sulfasalazine Hives  . Vicodin [Hydrocodone-Acetaminophen] Hives    Level of Care/Admitting Diagnosis ED Disposition    ED Disposition Condition Comment   Admit  Hospital Area: [facility] [[id number]]  Level of Care: Med-Surg [16]  Covid Evaluation: Asymptomatic Screening Protocol (No Symptoms)  Diagnosis: Cellulitis and abscess of left leg [[id number]]  Admitting Physician: Jama Flavors [[id number]]  Attending Physician: Jama Flavors [[id number]]  Estimated length of stay: 3 - 4 days  Certification:: I certify this patient will need inpatient services for at least 2 midnights  PT Class (Do Not Modify): Inpatient [101]  PT Acc Code (Do Not Modify): Private [1]       B Medical/Surgery History Past Medical History:  Diagnosis Date  . Asthma    AS A CHILD-NO INHALERS  . Cervical spine degeneration   . Chicken pox   . Cholelithiasis and acute cholecystitis without obstruction 12/08/2017  . Chronic back pain   . Frequent headaches   . GERD (gastroesophageal reflux disease)   . Migraine    Past Surgical History:  Procedure Laterality Date  . CHOLECYSTECTOMY N/A 11/28/2017   Procedure: LAPAROSCOPIC CHOLECYSTECTOMY;  Surgeon: Ancil Linsey, MD;  Location: [facility] ORS;   Service: General;  Laterality: N/A;  . NO PAST SURGERIES       A IV Location/Drains/Wounds Patient Lines/Drains/Airways Status   Active Line/Drains/Airways    Name:   Placement date:   Placement time:   Site:   Days:   Peripheral IV 08/05/19 Left Forearm   08/05/19    0648    Forearm   less than 1   Peripheral IV 08/05/19 Right Forearm   08/05/19    0854    Forearm   less than 1   Incision (Closed) 11/28/17 Abdomen Other (Comment)   11/28/17    1110     615   Incision - 4 Ports Abdomen Umbilicus Upper;Mid Right;Medial Right;Lateral   11/28/17    1135     615          Intake/Output Last 24 hours  Intake/Output Summary (Last 24 hours) at 08/05/2019 1113 Last data filed at 08/05/2019 1015 Gross per 24 hour  Intake 1144.85 ml  Output -  Net 1144.85 ml    Labs/Imaging Results for orders placed or performed during the hospital encounter of 08/05/19 (from the past 48 hour(s))  Basic metabolic panel     Status: Abnormal   Collection Time: 08/05/19  6:05 AM  Result Value Ref Range   Sodium 139 135 - 145 mmol/L   Potassium 3.1 (L) 3.5 - 5.1 mmol/L   Chloride 103 98 - 111 mmol/L   CO2 27 22 - 32 mmol/L   Glucose, Bld 91 70 - 99 mg/dL   BUN 12  6 - 20 mg/dL   Creatinine, Ser 0.89 0.61 - 1.24 mg/dL   Calcium 9.1 8.9 - 10.3 mg/dL   GFR calc non Af Amer >60 >60 mL/min   GFR calc Af Amer >60 >60 mL/min   Anion gap 9 5 - 15    Comment: Performed at [facility], [address] [place], [place] [address]  CBC with Differential     Status: Abnormal   Collection Time: 08/05/19  6:05 AM  Result Value Ref Range   WBC 18.1 (H) 4.0 - 10.5 K/uL   RBC 4.70 4.22 - 5.81 MIL/uL   Hemoglobin 14.8 13.0 - 17.0 g/dL   HCT 42.4 39.0 - 52.0 %   MCV 90.2 80.0 - 100.0 fL   MCH 31.5 26.0 - 34.0 pg   MCHC 34.9 30.0 - 36.0 g/dL   RDW 11.9 11.5 - 15.5 %   Platelets 310 150 - 400 K/uL   nRBC 0.0 0.0 - 0.2 %   Neutrophils Relative % 62 %   Neutro Abs 11.3 (H) 1.7 - 7.7 K/uL   Lymphocytes  Relative 21 %   Lymphs Abs 3.8 0.7 - 4.0 K/uL   Monocytes Relative 10 %   Monocytes Absolute 1.7 (H) 0.1 - 1.0 K/uL   Eosinophils Relative 6 %   Eosinophils Absolute 1.1 (H) 0.0 - 0.5 K/uL   Basophils Relative 1 %   Basophils Absolute 0.1 0.0 - 0.1 K/uL   Immature Granulocytes 0 %   Abs Immature Granulocytes 0.07 0.00 - 0.07 K/uL    Comment: Performed at [facility], [address] [place], [place] [address]  Lactic acid, plasma     Status: None   Collection Time: 08/05/19  6:49 AM  Result Value Ref Range   Lactic Acid, Venous 1.4 0.5 - 1.9 mmol/L    Comment: Performed at [facility], [address] [place], [place] [address]  Lactic acid, plasma     Status: None   Collection Time: 08/05/19 10:15 AM  Result Value Ref Range   Lactic Acid, Venous 1.1 0.5 - 1.9 mmol/L    Comment: Performed at [facility], [address] [place], [place] [address]   Dg Tibia/fibula Left  Result Date: 08/05/2019 CLINICAL DATA:  Left calf pain after insect bite. EXAM: LEFT TIBIA AND FIBULA - 2 VIEW COMPARISON:  None. FINDINGS: There is no evidence of fracture or other focal bone lesions. Soft tissue swelling is seen laterally in the proximal calf concerning for infection. IMPRESSION: No fracture or dislocation is noted. No lytic destruction is seen to suggest osteomyelitis. Soft tissue swelling is seen laterally over the proximal left calf suggesting infection. No radiopaque foreign body is noted. Electronically Signed   By: Marijo Conception M.D.   On: 08/05/2019 08:27    Pending Labs Unresulted Labs (From admission, onward)    Start     Ordered   08/06/19 3875  Basic metabolic panel  Tomorrow morning,   STAT     08/05/19 1024   08/06/19 0500  CBC  Tomorrow morning,   STAT     08/05/19 1024   08/06/19 0500  Magnesium  Tomorrow morning,   STAT     08/05/19 1024   08/05/19 1024  Magnesium  Add-on,   AD     08/05/19 1024   08/05/19 1022  Aerobic Culture (superficial  specimen)  Once,   STAT     08/05/19 1024   08/05/19 1020  HIV Antibody (  routine testing w rflx)  (HIV Antibody (Routine testing w reflex) panel)  Once,   STAT     08/05/19 1021   08/05/19 1020  HIV4GL Save Tube  (HIV Antibody (Routine testing w reflex) panel)  Once,   STAT     08/05/19 1021   08/05/19 0850  SARS CORONAVIRUS 2 (TAT 6-24 HRS) Nasopharyngeal Nasopharyngeal Swab  (Asymptomatic/Tier 2 Patients Labs)  Once,   STAT    Question Answer Comment  Is this test for diagnosis or screening Screening   Symptomatic for COVID-19 as defined by CDC No   Hospitalized for COVID-19 No   Admitted to ICU for COVID-19 No   Previously tested for COVID-19 Yes   Resident in a congregate (group) care setting No   Employed in healthcare setting No      08/05/19 0849   08/05/19 0803  Blood culture (routine x 2)  BLOOD CULTURE X 2,   STAT     08/05/19 0802          Vitals/Pain Today's Vitals   08/05/19 0459 08/05/19 0500 08/05/19 0640 08/05/19 0840  BP: 131/81  128/89   Pulse: 79  74   Resp: 17  18   Temp: 98.9 F (37.2 C)     TempSrc: Oral     SpO2: 96%  99%   Weight:  81.6 kg    Height:  5\' 7"  (1.702 m)    PainSc: 10-Worst pain ever   4     Isolation Precautions No active isolations  Medications Medications  SUMAtriptan (IMITREX) tablet 100 mg (has no administration in time range)  traZODone (DESYREL) tablet 100 mg (has no administration in time range)  famotidine (PEPCID) tablet 20 mg (has no administration in time range)  multivitamin with minerals tablet 1 tablet (has no administration in time range)  vitamin C (ASCORBIC ACID) tablet 250 mg (has no administration in time range)  albuterol (PROVENTIL) (2.5 MG/3ML) 0.083% nebulizer solution 3 mL (has no administration in time range)  enoxaparin (LOVENOX) injection 40 mg (has no administration in time range)  0.9 %  sodium chloride infusion (has no administration in time range)  HYDROmorphone (DILAUDID) injection 1 mg (has no  administration in time range)  lisinopril (ZESTRIL) tablet 20 mg (has no administration in time range)    And  hydrochlorothiazide (HYDRODIURIL) tablet 25 mg (has no administration in time range)  piperacillin-tazobactam (ZOSYN) IVPB 3.375 g (has no administration in time range)  HYDROmorphone (DILAUDID) injection 1 mg (1 mg Intravenous Given 08/05/19 0729)  ketorolac (TORADOL) 30 MG/ML injection 15 mg (15 mg Intravenous Given 08/05/19 0732)  lidocaine-EPINEPHrine (XYLOCAINE W/EPI) 2 %-1:200000 (PF) injection 20 mL (20 mLs Intradermal Given by Other 08/05/19 0821)  vancomycin (VANCOCIN) 2,000 mg in sodium chloride 0.9 % 500 mL IVPB (2,000 mg Intravenous New Bag/Given 08/05/19 0847)  sodium chloride 0.9 % bolus 1,000 mL (0 mLs Intravenous Stopped 08/05/19 1015)  cefTRIAXone (ROCEPHIN) 2 g in sodium chloride 0.9 % 100 mL IVPB (0 g Intravenous Stopped 08/05/19 0928)  potassium chloride SA (KLOR-CON) CR tablet 40 mEq (40 mEq Oral Given 08/05/19 1025)  HYDROmorphone (DILAUDID) injection 1 mg (1 mg Intravenous Given 08/05/19 1023)    Mobility walks Low fall risk   Focused Assessments skin assessment   R Recommendations: See Admitting Provider Note  Report given to:   Additional Notes: Pt alert & oriented, independent

## 2019-08-05 NOTE — Progress Notes (Signed)
Pt received to room 111 from ED. Pt alert and oriented. Left lower leg red, swollen, warm, painful to touch. No drainage noted at this time. Area has been marked with black marker in ED.  Pt oriented to room.

## 2019-08-05 NOTE — Progress Notes (Signed)
Pt requests Percocet for pain and headache. States that he has taken Percocet in the past for pain.

## 2019-08-05 NOTE — ED Provider Notes (Signed)
[facility] Emergency Department Provider Note  ____________________________________________   First MD Initiated Contact with Patient 08/05/19 [phone]     (approximate)  I have reviewed the triage vital signs and the nursing notes.   HISTORY Chief Complaint Cellulitis and Insect Bite    HPI Zachary Khan is a 43 y.o. male with past medical history as below here with wound and redness to the left leg.  The patient states he first noticed what he thought was an insect bite 4 to 5 days ago.  He noticed along the lateral aspect of the left calf.  He states that a small area became increasingly painful and swollen since then.  It is now spread rapidly over the last 24 hours with increasing redness extending up to his proximal thigh.  He said associated chills.  The pain is aching, throbbing, severe, worse with any movement.  It is worse whenever he tries to flex his calf as well.  Denies any known injury to the area.  He has a history of previous boils but denies any known history of MRSA.  Denies any IV drug use.  No other complaints.        Past Medical History:  Diagnosis Date   Asthma    AS A CHILD-NO INHALERS   Cervical spine degeneration    Chicken pox    Cholelithiasis and acute cholecystitis without obstruction 12/08/2017   Chronic back pain    Frequent headaches    GERD (gastroesophageal reflux disease)    Migraine     Patient Active Problem List   Diagnosis Date Noted   Dizziness 07/29/2019   Suspected COVID-19 virus infection 04/11/2019   GERD (gastroesophageal reflux disease) 02/22/2019   Asthma 02/22/2019   Chronic wrist pain 12/11/2018   Insomnia 08/14/2018   Preventative health care 08/14/2018   Essential hypertension 11/17/2017   History of cholecystectomy 11/13/2017   Migraines 06/01/2016   Chronic back pain 06/01/2016   Chronic neck pain 06/01/2016    Past Surgical History:  Procedure Laterality Date    CHOLECYSTECTOMY N/A 11/28/2017   Procedure: LAPAROSCOPIC CHOLECYSTECTOMY;  Surgeon: Ancil Linsey, MD;  Location: [facility] ORS;  Service: General;  Laterality: N/A;   NO PAST SURGERIES      Prior to Admission medications   Medication Sig Start Date End Date Taking? Authorizing Provider  diclofenac (VOLTAREN) 75 MG EC tablet TAKE 1 TABLET(75 MG) BY MOUTH TWICE DAILY AS NEEDED FOR PAIN AND INFLAMMATION 05/13/19   Lorre Munroe, NP  famotidine (PEPCID) 20 MG tablet Take 1 tablet (20 mg total) by mouth 2 (two) times daily. 08/29/18   Sharman Cheek, MD  fluticasone (FLOVENT HFA) 110 MCG/ACT inhaler Inhale 1 puff into the lungs 2 (two) times a day. 03/15/19   Doreene Nest, NP  lisinopril-hydrochlorothiazide (PRINZIDE,ZESTORETIC) 20-25 MG tablet Take 1 tablet by mouth daily. For blood pressure. 09/25/18   Doreene Nest, NP  SUMAtriptan (IMITREX) 100 MG tablet Take 1 tablet at migraine onset. May repeat in 2 hours if no resolve. Do not exceed 2 tablets in 24 hours. 09/25/18   Doreene Nest, NP  traZODone (DESYREL) 100 MG tablet TAKE 1 TABLET(100 MG) BY MOUTH AT BEDTIME FOR SLEEP 03/22/19   Doreene Nest, NP  VENTOLIN HFA 108 (90 Base) MCG/ACT inhaler INHALE 2 PUFFS INTO THE LUNGS EVERY 6 HOURS AS NEEDED FOR WHEEZING OR SHORTNESS OF BREATH 04/16/19   Doreene Nest, NP    Allergies Hydrocodone-acetaminophen, Sulfa antibiotics,  Sulfasalazine, and Vicodin [hydrocodone-acetaminophen]  Family History  Problem Relation Age of Onset   Arthritis Mother    Heart disease Mother    Hypertension Mother    Alcohol abuse Father    ALS Father    Rheum arthritis Maternal Grandmother     Social History Social History   Tobacco Use   Smoking status: Current Every Day Smoker    Packs/day: 0.25    Years: 20.00    Pack years: 5.00    Types: Cigarettes   Smokeless tobacco: Never Used  Substance Use Topics   Alcohol use: Yes    Comment: 2 BEERS ON WEEKEND   Drug use: Yes      Types: Marijuana    Comment: last smoked saturday    Review of Systems  Review of Systems  Constitutional: Positive for chills, fatigue and fever.  HENT: Negative for sore throat.   Respiratory: Negative for shortness of breath.   Cardiovascular: Negative for chest pain.  Gastrointestinal: Negative for abdominal pain.  Genitourinary: Negative for flank pain.  Musculoskeletal: Positive for arthralgias. Negative for neck pain.  Skin: Positive for rash and wound.  Allergic/Immunologic: Negative for immunocompromised state.  Neurological: Negative for numbness.  Hematological: Does not bruise/bleed easily.  All other systems reviewed and are negative.    ____________________________________________  PHYSICAL EXAM:      VITAL SIGNS: ED Triage Vitals  Enc Vitals Group     BP 08/05/19 0459 131/81     Pulse Rate 08/05/19 0459 79     Resp 08/05/19 0459 17     Temp 08/05/19 0459 98.9 F (37.2 C)     Temp Source 08/05/19 0459 Oral     SpO2 08/05/19 0459 96 %     Weight 08/05/19 0500 180 lb (81.6 kg)     Height 08/05/19 0500 5\' 7"  (1.702 m)     Head Circumference --      Peak Flow --      Pain Score 08/05/19 0459 10     Pain Loc --      Pain Edu? --      Excl. in [place]? --      Physical Exam Vitals signs and nursing note reviewed.  Constitutional:      General: He is not in acute distress.    Appearance: He is well-developed.  HENT:     Head: Normocephalic and atraumatic.  Eyes:     Conjunctiva/sclera: Conjunctivae normal.  Neck:     Musculoskeletal: Neck supple.  Cardiovascular:     Rate and Rhythm: Normal rate and regular rhythm.     Heart sounds: Normal heart sounds. No murmur. No friction rub.  Pulmonary:     Effort: Pulmonary effort is normal. No respiratory distress.     Breath sounds: Normal breath sounds. No wheezing or rales.  Abdominal:     General: There is no distension.     Palpations: Abdomen is soft.     Tenderness: There is no abdominal  tenderness.  Skin:    General: Skin is warm.     Capillary Refill: Capillary refill takes less than 2 seconds.  Neurological:     Mental Status: He is alert and oriented to person, place, and time.     Motor: No abnormal muscle tone.      LOWER EXTREMITY EXAM: LEFT  INSPECTION & PALPATION: Large area of induration, warmth, and tenderness to distal lateral leg extending from near popliteal area to just proximal to ankle. Central area of  marked induration without overt fluctuance.  SENSORY: sensation is intact to light touch in:  Superficial peroneal nerve distribution (over dorsum of foot) Deep peroneal nerve distribution (over first dorsal web space) Sural nerve distribution (over lateral aspect 5th metatarsal) Saphenous nerve distribution (over medial instep)  MOTOR:  + Motor EHL (great toe dorsiflexion) + FHL (great toe plantar flexion)  + TA (ankle dorsiflexion)  + GSC (ankle plantar flexion)  VASCULAR: 2+ dorsalis pedis and posterior tibialis pulses Capillary refill < 2 sec, toes warm and well-perfused  COMPARTMENTS: Soft, warm, well-perfused No pain with passive extension No parethesias   ____________________________________________   LABS (all labs ordered are listed, but only abnormal results are displayed)  Labs Reviewed  BASIC METABOLIC PANEL - Abnormal; Notable for the following components:      Result Value   Potassium 3.1 (*)    All other components within normal limits  CBC WITH DIFFERENTIAL/PLATELET - Abnormal; Notable for the following components:   WBC 18.1 (*)    Neutro Abs 11.3 (*)    Monocytes Absolute 1.7 (*)    Eosinophils Absolute 1.1 (*)    All other components within normal limits  CULTURE, BLOOD (ROUTINE X 2)  CULTURE, BLOOD (ROUTINE X 2)  SARS CORONAVIRUS 2 (TAT 6-24 HRS)  LACTIC ACID, PLASMA  LACTIC ACID, PLASMA    ____________________________________________  EKG: None ________________________________________  RADIOLOGY All  imaging, including plain films, CT scans, and ultrasounds, independently reviewed by me, and interpretations confirmed via formal radiology reads.  ED MD interpretation:   XR Tib/Fib Left: neg for fx or osteo, + soft tissue swelling  Official radiology report(s): Dg Tibia/fibula Left  Result Date: 08/05/2019 CLINICAL DATA:  Left calf pain after insect bite. EXAM: LEFT TIBIA AND FIBULA - 2 VIEW COMPARISON:  None. FINDINGS: There is no evidence of fracture or other focal bone lesions. Soft tissue swelling is seen laterally in the proximal calf concerning for infection. IMPRESSION: No fracture or dislocation is noted. No lytic destruction is seen to suggest osteomyelitis. Soft tissue swelling is seen laterally over the proximal left calf suggesting infection. No radiopaque foreign body is noted. Electronically Signed   By: Lupita RaiderJames  Green Jr M.D.   On: 08/05/2019 08:27    ____________________________________________  PROCEDURES   Procedure(s) performed (including Critical Care):  Marland Kitchen.Marland Kitchen.Incision and Drainage  Date/Time: 08/05/2019 8:28 AM Performed by: Shaune PollackIsaacs, Okechukwu Regnier, MD Authorized by: Shaune PollackIsaacs, Shirline Kendle, MD   Consent:    Consent obtained:  Verbal   Consent given by:  Patient   Risks discussed:  Bleeding, damage to other organs, incomplete drainage, infection and pain   Alternatives discussed:  Alternative treatment and delayed treatment Location:    Type:  Abscess   Location:  Lower extremity   Lower extremity location:  Leg   Leg location:  L lower leg Pre-procedure details:    Skin preparation:  Betadine Anesthesia (see MAR for exact dosages):    Anesthesia method:  Local infiltration   Local anesthetic:  Lidocaine 1% WITH epi Procedure type:    Complexity:  Simple Procedure details:    Incision types:  Stab incision   Incision depth:  Dermal   Scalpel blade:  11   Wound management:  Probed and deloculated and irrigated with saline   Drainage:  Purulent   Drainage amount:   Scant   Wound treatment:  Wound left open Post-procedure details:    Patient tolerance of procedure:  Tolerated well, no immediate complications  Ultrasound ED Soft Tissue  Date/Time: 08/05/2019  8:51 AM Performed by: Shaune Pollack, MD Authorized by: Shaune Pollack, MD   Procedure details:    Indications: localization of abscess and evaluate for cellulitis     Transverse view:  Visualized   Longitudinal view:  Visualized   Images: archived     Limitations:  Positioning Location:    Location: lower extremity     Side:  Left Findings:     cellulitis present Comments:     Severe cellulitis and cobblestoning noted extending from ankle to popliteal area. No overt fluid collection identified.    ____________________________________________  INITIAL IMPRESSION / MDM / ASSESSMENT AND PLAN / ED COURSE  As part of my medical decision making, I reviewed the following data within the electronic MEDICAL RECORD NUMBER Notes from prior ED visits and [place] Controlled Substance Database      *JAROLD MACOMBER was evaluated in Emergency Department on 08/05/2019 for the symptoms described in the history of present illness. He was evaluated in the context of the global COVID-19 pandemic, which necessitated consideration that the patient might be at risk for infection with the SARS-CoV-2 virus that causes COVID-19. Institutional protocols and algorithms that pertain to the evaluation of patients at risk for COVID-19 are in a state of rapid change based on information released by regulatory bodies including the CDC and federal and state organizations. These policies and algorithms were followed during the patient's care in the ED.  Some ED evaluations and interventions may be delayed as a result of limited staffing during the pandemic.*   Clinical Course as of Aug 05 903  Mon Aug 05, 2019  3355 43 year old male here with significant cellulitis to the left lower extremity.  There is marked induration, no  overt fluctuance, and bedside incision and drainage returns a small amount of purulence with no obvious abscess cavity.  Bedside ultrasound shows diffuse, significant cellulitis with skin thickening but no evidence of fluid collection.  Of note, since arriving in the ED, his erythema and cellulitis appears to have already extended beyond the borders of tracing.  Suspect he may need inpatient antibiotics.  Will follow up lactic acid.   [CI]  0904 Lactic acid normal but cellulitis continues to spread.  Admit for antibiotics and observation.   [CI]    Clinical Course User Index [CI] Shaune Pollack, MD    Medical Decision Making:  As above.  ____________________________________________  FINAL CLINICAL IMPRESSION(S) / ED DIAGNOSES  Final diagnoses:  Left leg cellulitis  Other elevated white blood cell (WBC) count     MEDICATIONS GIVEN DURING THIS VISIT:  Medications  vancomycin (VANCOCIN) 2,000 mg in sodium chloride 0.9 % 500 mL IVPB (2,000 mg Intravenous New Bag/Given 08/05/19 0847)  cefTRIAXone (ROCEPHIN) 2 g in sodium chloride 0.9 % 100 mL IVPB (2 g Intravenous New Bag/Given 08/05/19 0858)  potassium chloride SA (KLOR-CON) CR tablet 40 mEq (has no administration in time range)  HYDROmorphone (DILAUDID) injection 1 mg (has no administration in time range)  HYDROmorphone (DILAUDID) injection 1 mg (1 mg Intravenous Given 08/05/19 0729)  ketorolac (TORADOL) 30 MG/ML injection 15 mg (15 mg Intravenous Given 08/05/19 0732)  lidocaine-EPINEPHrine (XYLOCAINE W/EPI) 2 %-1:200000 (PF) injection 20 mL (20 mLs Intradermal Given by Other 08/05/19 0821)  sodium chloride 0.9 % bolus 1,000 mL (1,000 mLs Intravenous New Bag/Given 08/05/19 6160)     ED Discharge Orders    None       Note:  This document was prepared using Dragon voice recognition software and may include unintentional  dictation errors.   Shaune Pollack, MD 08/05/19 [phone]

## 2019-08-05 NOTE — Consult Note (Signed)
Pharmacy Antibiotic Note  Zachary Khan is a 43 y.o. male admitted on 08/05/2019 with cellulitis.  Pharmacy has been consulted for Vancomycin/Zosyn dosing.  Plan: Zosyn 3.375g IV q8h (4 hour infusion).  Pt received Vancomycin 2000mg  x 1 in ED  Will follow with:  Vancomycin 1250 mg IV Q 12 hrs. Goal AUC 400-550. Expected AUC: 481 SCr used: 0.89   Height: 5\' 7"  (170.2 cm) Weight: 180 lb (81.6 kg) IBW/kg (Calculated) : 66.1  Temp (24hrs), Avg:98.6 F (37 C), Min:98.3 F (36.8 C), Max:98.9 F (37.2 C)  Recent Labs  Lab 07/29/19 1458 07/30/19 1856 08/05/19 0605 08/05/19 0649 08/05/19 1015  WBC 7.8 8.1 18.1*  --   --   CREATININE 0.96 0.86 0.89  --   --   LATICACIDVEN  --   --   --  1.4 1.1    Estimated Creatinine Clearance: 110.6 mL/min (by C-G formula based on SCr of 0.89 mg/dL).    Allergies  Allergen Reactions  . Hydrocodone-Acetaminophen Hives  . Sulfa Antibiotics Hives  . Sulfasalazine Hives  . Vicodin [Hydrocodone-Acetaminophen] Hives    Antimicrobials this admission: 10/12 Ceftriaxone 2g x 1  Vancomycin 10/12 >>  Zosyn 10/12 >>  Dose adjustments this admission: None  Microbiology results: 10/12 BCx: pending 10/12 WndCx: pending   Thank you for allowing pharmacy to be a part of this patient's care.  Lu Duffel, PharmD, BCPS Clinical Pharmacist 08/05/2019 11:46 AM

## 2019-08-05 NOTE — ED Notes (Signed)
Area of redness marked at this time with black marker

## 2019-08-05 NOTE — Plan of Care (Signed)

## 2019-08-05 NOTE — ED Triage Notes (Signed)
Pt presents tonight with possible insect bite to left calf; noticed area starting to turn red on Friday night; now with large around of redness to entire calf area; calf swollen and arm to touch; very tender; pt denies fever at home

## 2019-08-05 NOTE — H&P (Signed)
[facility] [place] [facility]   PATIENT NAME: Zachary Khan    MR#:  [id number]  DATE OF BIRTH:  October 16, 1976  DATE OF ADMISSION:  08/05/2019  PRIMARY CARE PHYSICIAN: Doreene Nest, NP   REQUESTING/REFERRING PHYSICIAN: Shaune Pollack  CHIEF COMPLAINT:   Chief Complaint  Patient presents with  . Cellulitis  . Insect Bite    HISTORY OF PRESENT ILLNESS:  Zachary Khan  is a 43 y.o. male with a known history of hypertension, tobacco abuse on migraine headaches who presented to the emergency room with worsening swelling, redness and pain involving left leg.  Patient states that he he was likely bitten by an insect about 4 to 5 days ago on the lateral aspect of his left cough.  Area became increasingly more painful and swollen and red over the last 24 hours.  Patient was evaluated in the emergency room.  Had bedside I&D already done by emergency room provider.  Started on broad-spectrum IV antibiotics.  Noted to have leukocytosis with white count of 18,000.  Does not appear to be septic clinically however.  Still having pains under left leg.  X-rays done with no acute findings.  Medical service called to admit patient for further evaluation and management.  PAST MEDICAL HISTORY:   Past Medical History:  Diagnosis Date  . Asthma    AS A CHILD-NO INHALERS  . Cervical spine degeneration   . Chicken pox   . Cholelithiasis and acute cholecystitis without obstruction 12/08/2017  . Chronic back pain   . Frequent headaches   . GERD (gastroesophageal reflux disease)   . Migraine     PAST SURGICAL HISTORY:   Past Surgical History:  Procedure Laterality Date  . CHOLECYSTECTOMY N/A 11/28/2017   Procedure: LAPAROSCOPIC CHOLECYSTECTOMY;  Surgeon: Ancil Linsey, MD;  Location: [facility] ORS;  Service: General;  Laterality: N/A;  . NO PAST SURGERIES      SOCIAL HISTORY:   Social History   Tobacco Use  . Smoking status: Current Every Day Smoker    Packs/day: 0.25     Years: 20.00    Pack years: 5.00    Types: Cigarettes  . Smokeless tobacco: Never Used  Substance Use Topics  . Alcohol use: Yes    Comment: 2 BEERS ON WEEKEND    FAMILY HISTORY:   Family History  Problem Relation Age of Onset  . Arthritis Mother   . Heart disease Mother   . Hypertension Mother   . Alcohol abuse Father   . ALS Father   . Rheum arthritis Maternal Grandmother     DRUG ALLERGIES:   Allergies  Allergen Reactions  . Hydrocodone-Acetaminophen Hives  . Sulfa Antibiotics Hives  . Sulfasalazine Hives  . Vicodin [Hydrocodone-Acetaminophen] Hives    REVIEW OF SYSTEMS:   Review of Systems  Constitutional: Negative for chills and fever.  HENT: Negative for hearing loss and tinnitus.   Eyes: Negative for blurred vision and double vision.  Respiratory: Negative for cough and hemoptysis.   Cardiovascular: Negative for chest pain and palpitations.  Gastrointestinal: Negative for heartburn and nausea.  Genitourinary: Negative for dysuria and urgency.  Musculoskeletal: Negative for myalgias and neck pain.         Cellulitis with abscess of left leg status post I&D in the emergency room  Skin: Negative for itching and rash.       Area of redness and swelling involving the left leg.  Neurological: Negative for dizziness and headaches.  Psychiatric/Behavioral: Negative  for depression and hallucinations.    MEDICATIONS AT HOME:   Prior to Admission medications   Medication Sig Start Date End Date Taking? Authorizing Provider  diclofenac (VOLTAREN) 75 MG EC tablet TAKE 1 TABLET(75 MG) BY MOUTH TWICE DAILY AS NEEDED FOR PAIN AND INFLAMMATION 05/13/19  Yes Lorre MunroeBaity, Regina W, NP  famotidine (PEPCID) 20 MG tablet Take 1 tablet (20 mg total) by mouth 2 (two) times daily. Patient taking differently: Take 40 mg by mouth daily.  08/29/18  Yes Sharman CheekStafford, Phillip, MD  lisinopril-hydrochlorothiazide (PRINZIDE,ZESTORETIC) 20-25 MG tablet Take 1 tablet by mouth daily. For blood  pressure. 09/25/18  Yes Doreene Nestlark, Katherine K, NP  Multiple Vitamin (MULTIVITAMIN WITH MINERALS) TABS tablet Take 1 tablet by mouth daily.   Yes [provider]  SUMAtriptan (IMITREX) 100 MG tablet Take 1 tablet at migraine onset. May repeat in 2 hours if no resolve. Do not exceed 2 tablets in 24 hours. 09/25/18  Yes Doreene Nestlark, Katherine K, NP  traZODone (DESYREL) 100 MG tablet TAKE 1 TABLET(100 MG) BY MOUTH AT BEDTIME FOR SLEEP Patient taking differently: Take 100 mg by mouth at bedtime as needed.  03/22/19  Yes Doreene Nestlark, Katherine K, NP  VENTOLIN HFA 108 (90 Base) MCG/ACT inhaler INHALE 2 PUFFS INTO THE LUNGS EVERY 6 HOURS AS NEEDED FOR WHEEZING OR SHORTNESS OF BREATH 04/16/19  Yes Doreene Nestlark, Katherine K, NP  vitamin C (ASCORBIC ACID) 250 MG tablet Take 250 mg by mouth daily.   Yes [provider]      VITAL SIGNS:  Blood pressure 128/89, pulse 74, temperature 98.9 F (37.2 C), temperature source Oral, resp. rate 18, height 5\' 7"  (1.702 m), weight 81.6 kg, SpO2 99 %.  PHYSICAL EXAMINATION:  Physical Exam  GENERAL:  43 y.o.-year-old patient lying in the bed with no acute distress.  EYES: Pupils equal, round, reactive to light and accommodation. No scleral icterus. Extraocular muscles intact.  HEENT: Head atraumatic, normocephalic. Oropharynx and nasopharynx clear.  NECK:  Supple, no jugular venous distention. No thyroid enlargement, no tenderness.  LUNGS: Normal breath sounds bilaterally, no wheezing, rales,rhonchi or crepitation. No use of accessory muscles of respiration.  CARDIOVASCULAR: S1, S2 normal. No murmurs, rubs, or gallops.  ABDOMEN: Soft, nontender, nondistended. Bowel sounds present. No organomegaly or mass.  EXTREMITIES: Area of swelling, redness and tenderness concerning for underlying abscess on patient status post I&D done in the emergency room today NEUROLOGIC: Cranial nerves II through XII are intact. Muscle strength 5/5 in all extremities. Sensation intact. Gait not  checked.  PSYCHIATRIC: The patient is alert and oriented x 3.  SKIN: Area of redness, swelling tenderness on the left lower extremity suggestive of underlying abscess with surrounding cellulitis.  See picture below.      LABORATORY PANEL:   CBC Recent Labs  Lab 08/05/19 0605  WBC 18.1*  HGB 14.8  HCT 42.4  PLT 310   ------------------------------------------------------------------------------------------------------------------  Chemistries  Recent Labs  Lab 07/30/19 1856 08/05/19 0605  NA 139 139  K 3.0* 3.1*  CL 102 103  CO2 27 27  GLUCOSE 114* 91  BUN 12 12  CREATININE 0.86 0.89  CALCIUM 9.1 9.1  AST 18  --   ALT 31  --   ALKPHOS 45  --   BILITOT 1.1  --    ------------------------------------------------------------------------------------------------------------------  Cardiac Enzymes No results for input(s): TROPONINI in the last 168 hours. ------------------------------------------------------------------------------------------------------------------  RADIOLOGY:  Dg Tibia/fibula Left  Result Date: 08/05/2019 CLINICAL DATA:  Left calf pain after insect bite. EXAM: LEFT  TIBIA AND FIBULA - 2 VIEW COMPARISON:  None. FINDINGS: There is no evidence of fracture or other focal bone lesions. Soft tissue swelling is seen laterally in the proximal calf concerning for infection. IMPRESSION: No fracture or dislocation is noted. No lytic destruction is seen to suggest osteomyelitis. Soft tissue swelling is seen laterally over the proximal left calf suggesting infection. No radiopaque foreign body is noted. Electronically Signed   By: Marijo Conception M.D.   On: 08/05/2019 08:27      IMPRESSION AND PLAN:  Patient is a 43 year old male with history of hypertension, tobacco abuse and migraine headaches being admitted for left lower extremity abscess with cellulitis  1.  Left lower extremity abscess with surrounding area of cellulitis Patient status post I&D done by  emergency room provider. Felt to be due to insect bite 5 days prior to admission. Requested for wound cultures. Placed on broad-spectrum IV antibiotics with vancomycin and Zosyn pending clinical course and culture results.  Patient with significant leukocytosis with white count of 18,000.  Does not appear to be septic clinically IV fluid hydration. As needed Dilaudid for pain control which patient tolerated in the emergency room.  2.  Hypertension Blood pressure controlled.  Resume home blood pressure meds.  3.  Tobacco abuse Smokes about half a pack of cigarettes per day. Smoking cessation counseling done.  Motivated to quit.  Declined nicotine patch.  4.  History of migraine headaches Under control  DVT prophylaxis; Lovenox  All the records are reviewed and case discussed with ED provider. Management plans discussed with the patient, and he is in agreement.  CODE STATUS: Full code  TOTAL TIME TAKING CARE OF THIS PATIENT: 59 minutes.    Zurich Carreno M.D on 08/05/2019 at 10:29 AM  Between 7am to 6pm - Pager - [phone]  After 6pm go to [email or web address] - Proofreader  [facility]  Office  [phone]  CC: Primary care physician; Pleas Koch, NP   Note: This dictation was prepared with Dragon dictation along with smaller phrase technology. Any transcriptional errors that result from this process are unintentional.

## 2019-08-06 ENCOUNTER — Inpatient Hospital Stay: Payer: 59 | Admitting: Anesthesiology

## 2019-08-06 ENCOUNTER — Encounter
Admission: EM | Disposition: A | Discharge status: Home Health | Payer: Self-pay | Source: Home / Self Care | Attending: Internal Medicine

## 2019-08-06 ENCOUNTER — Encounter: Payer: Self-pay | Admitting: *Deleted

## 2019-08-06 DIAGNOSIS — L03116 Cellulitis of left lower limb: Principal | ICD-10-CM

## 2019-08-06 DIAGNOSIS — L02416 Cutaneous abscess of left lower limb: Secondary | ICD-10-CM

## 2019-08-06 HISTORY — PX: IRRIGATION AND DEBRIDEMENT ABSCESS: SHX5252

## 2019-08-06 LAB — CBC
HCT: 38.8 % — ABNORMAL LOW (ref 39.0–52.0)
Hemoglobin: 13.5 g/dL (ref 13.0–17.0)
MCH: 31.5 pg (ref 26.0–34.0)
MCHC: 34.8 g/dL (ref 30.0–36.0)
MCV: 90.7 fL (ref 80.0–100.0)
Platelets: 260 10*3/uL (ref 150–400)
RBC: 4.28 MIL/uL (ref 4.22–5.81)
RDW: 11.9 % (ref 11.5–15.5)
WBC: 17.2 10*3/uL — ABNORMAL HIGH (ref 4.0–10.5)
nRBC: 0 % (ref 0.0–0.2)

## 2019-08-06 LAB — BASIC METABOLIC PANEL
Anion gap: 9 (ref 5–15)
BUN: 11 mg/dL (ref 6–20)
CO2: 26 mmol/L (ref 22–32)
Calcium: 8.6 mg/dL — ABNORMAL LOW (ref 8.9–10.3)
Chloride: 103 mmol/L (ref 98–111)
Creatinine, Ser: 0.94 mg/dL (ref 0.61–1.24)
GFR calc Af Amer: >60 mL/min (ref >60)
GFR calc non Af Amer: >60 mL/min (ref >60)
Glucose, Bld: 99 mg/dL (ref 70–99)
Potassium: 3.1 mmol/L — ABNORMAL LOW (ref 3.5–5.1)
Sodium: 138 mmol/L (ref 135–145)

## 2019-08-06 LAB — MAGNESIUM: Magnesium: 2.1 mg/dL (ref 1.7–2.4)

## 2019-08-06 LAB — HIV ANTIBODY (ROUTINE TESTING W REFLEX): HIV Screen 4th Generation wRfx: NONREACTIVE

## 2019-08-06 SURGERY — IRRIGATION AND DEBRIDEMENT ABSCESS
Anesthesia: General | Site: Leg Lower | Laterality: Left

## 2019-08-06 MED ORDER — FENTANYL CITRATE (PF) 100 MCG/2ML IJ SOLN
INTRAMUSCULAR | Status: AC
  Filled 2019-08-06: qty 2

## 2019-08-06 MED ORDER — FENTANYL CITRATE (PF) 100 MCG/2ML IJ SOLN
25.0000 ug | INTRAMUSCULAR | Status: DC | PRN
  Administered 2019-08-06 (×3): 25 ug via INTRAVENOUS

## 2019-08-06 MED ORDER — MIDAZOLAM HCL 2 MG/2ML IJ SOLN
INTRAMUSCULAR | Status: AC
  Filled 2019-08-06: qty 2

## 2019-08-06 MED ORDER — LIDOCAINE HCL (CARDIAC) PF 100 MG/5ML IV SOSY
PREFILLED_SYRINGE | INTRAVENOUS | Status: DC | PRN
  Administered 2019-08-06: 60 mg via INTRAVENOUS

## 2019-08-06 MED ORDER — EPINEPHRINE PF 1 MG/ML IJ SOLN
INTRAMUSCULAR | Status: AC
  Filled 2019-08-06: qty 1

## 2019-08-06 MED ORDER — IBUPROFEN 400 MG PO TABS
400.0000 mg | ORAL_TABLET | Freq: Three times a day (TID) | ORAL | Status: DC | PRN
  Administered 2019-08-06: 12:00:00 400 mg via ORAL
  Filled 2019-08-06: qty 1

## 2019-08-06 MED ORDER — VANCOMYCIN HCL 1.25 G IV SOLR
1250.0000 mg | Freq: Two times a day (BID) | INTRAVENOUS | Status: DC
  Administered 2019-08-06 (×2): 1250 mg via INTRAVENOUS
  Filled 2019-08-06 (×5): qty 1250

## 2019-08-06 MED ORDER — PROPOFOL 10 MG/ML IV BOLUS
INTRAVENOUS | Status: DC | PRN
  Administered 2019-08-06: 50 mg via INTRAVENOUS
  Administered 2019-08-06: 30 mg via INTRAVENOUS

## 2019-08-06 MED ORDER — PROPOFOL 500 MG/50ML IV EMUL
INTRAVENOUS | Status: DC | PRN
  Administered 2019-08-06: 75 ug/kg/min via INTRAVENOUS

## 2019-08-06 MED ORDER — PROPOFOL 10 MG/ML IV BOLUS
INTRAVENOUS | Status: AC
  Filled 2019-08-06: qty 20

## 2019-08-06 MED ORDER — ACETAMINOPHEN 325 MG PO TABS: 650.0000 mg | ORAL_TABLET | Freq: Four times a day (QID) | ORAL | Status: DC | PRN

## 2019-08-06 MED ORDER — MIDAZOLAM HCL 2 MG/2ML IJ SOLN
INTRAMUSCULAR | Status: DC | PRN
  Administered 2019-08-06 (×2): 1 mg via INTRAVENOUS
  Administered 2019-08-06: 2 mg via INTRAVENOUS

## 2019-08-06 MED ORDER — BUPIVACAINE HCL (PF) 0.25 % IJ SOLN
INTRAMUSCULAR | Status: AC
  Filled 2019-08-06: qty 30

## 2019-08-06 MED ORDER — POTASSIUM CHLORIDE CRYS ER 20 MEQ PO TBCR
40.0000 meq | EXTENDED_RELEASE_TABLET | Freq: Once | ORAL | Status: AC
  Administered 2019-08-06: 09:00:00 40 meq via ORAL
  Filled 2019-08-06: qty 2

## 2019-08-06 MED ORDER — FENTANYL CITRATE (PF) 100 MCG/2ML IJ SOLN
INTRAMUSCULAR | Status: AC
  Administered 2019-08-06: 25 ug via INTRAVENOUS
  Filled 2019-08-06: qty 2

## 2019-08-06 MED ORDER — DOCUSATE SODIUM 100 MG PO CAPS
100.0000 mg | ORAL_CAPSULE | Freq: Two times a day (BID) | ORAL | Status: DC
  Administered 2019-08-06 – 2019-08-07 (×3): 100 mg via ORAL
  Filled 2019-08-06 (×3): qty 1

## 2019-08-06 MED ORDER — OXYCODONE-ACETAMINOPHEN 5-325 MG PO TABS
1.0000 | ORAL_TABLET | ORAL | Status: DC | PRN
  Administered 2019-08-06 – 2019-08-07 (×4): 1 via ORAL
  Filled 2019-08-06 (×5): qty 1

## 2019-08-06 MED ORDER — FENTANYL CITRATE (PF) 100 MCG/2ML IJ SOLN
INTRAMUSCULAR | Status: DC | PRN
  Administered 2019-08-06: 50 ug via INTRAVENOUS
  Administered 2019-08-06 (×2): 25 ug via INTRAVENOUS

## 2019-08-06 SURGICAL SUPPLY — 23 items
BLADE CLIPPER SURG (BLADE) ×2 IMPLANT
BLADE SURG 15 STRL LF DISP TIS (BLADE) ×1 IMPLANT
BLADE SURG 15 STRL SS (BLADE) ×2
BNDG GAUZE 1X2.1 STRL (MISCELLANEOUS) ×1 IMPLANT
BNDG GAUZE 4.5X4.1 6PLY STRL (MISCELLANEOUS) ×1 IMPLANT
CANISTER SUCT 1200ML W/VALVE (MISCELLANEOUS) ×2 IMPLANT
COVER WAND RF STERILE (DRAPES) ×2 IMPLANT
DRAPE LAPAROTOMY TRNSV 106X77 (MISCELLANEOUS) ×2 IMPLANT
ELECT CAUTERY BLADE 6.4 (BLADE) ×2 IMPLANT
ELECT REM PT RETURN 9FT ADLT (ELECTROSURGICAL) ×2
ELECTRODE REM PT RTRN 9FT ADLT (ELECTROSURGICAL) ×1 IMPLANT
GLOVE BIO SURGEON STRL SZ7 (GLOVE) ×2 IMPLANT
GOWN STRL REUS W/ TWL LRG LVL3 (GOWN DISPOSABLE) ×2 IMPLANT
GOWN STRL REUS W/TWL LRG LVL3 (GOWN DISPOSABLE) ×4
HANDLE YANKAUER SUCT BULB TIP (MISCELLANEOUS) ×1 IMPLANT
NEEDLE HYPO 22GX1.5 SAFETY (NEEDLE) ×2 IMPLANT
NS IRRIG 1000ML POUR BTL (IV SOLUTION) ×2 IMPLANT
PACK BASIN MINOR ARMC (MISCELLANEOUS) ×2 IMPLANT
PAD ABD DERMACEA PRESS 5X9 (GAUZE/BANDAGES/DRESSINGS) ×1 IMPLANT
SOL PREP PVP 2OZ (MISCELLANEOUS) ×2
SOLUTION PREP PVP 2OZ (MISCELLANEOUS) ×1 IMPLANT
SPONGE LAP 18X18 RF (DISPOSABLE) ×2 IMPLANT
SYR 20ML LL LF (SYRINGE) ×2 IMPLANT

## 2019-08-06 NOTE — Anesthesia Postprocedure Evaluation (Signed)
Anesthesia Post Note  Patient: ALFARD COCHRANE  Procedure(s) Performed: IRRIGATION AND DEBRIDEMENT ABSCESS Left Lower Leg (Left Leg Lower)  Patient location during evaluation: PACU Anesthesia Type: General Level of consciousness: awake and alert Pain management: pain level controlled Vital Signs Assessment: post-procedure vital signs reviewed and stable Respiratory status: spontaneous breathing, nonlabored ventilation, respiratory function stable and patient connected to nasal cannula oxygen Cardiovascular status: blood pressure returned to baseline and stable Postop Assessment: no apparent nausea or vomiting Anesthetic complications: no     Last Vitals:  Vitals:   08/06/19 1822 08/06/19 2014  BP: 107/66 121/61  Pulse: (!) 55 75  Resp: 16 20  Temp: 36.4 C 36.6 C  SpO2: 99% 95%    Last Pain:  Vitals:   08/06/19 2014  TempSrc: Oral  PainSc:                  Martha Clan

## 2019-08-06 NOTE — Anesthesia Preprocedure Evaluation (Signed)
Anesthesia Evaluation  Patient identified by MRN, date of birth, ID band Patient awake    Reviewed: Allergy & Precautions, NPO status , Patient's Chart, lab work & pertinent test results  History of Anesthesia Complications Negative for: history of anesthetic complications  Airway Mallampati: II  TM Distance: >3 FB     Dental  (+) Chipped   Pulmonary neg shortness of breath, asthma , neg sleep apnea, neg COPD, neg recent URI, Current Smoker and Patient abstained from smoking.,    Pulmonary exam normal        Cardiovascular hypertension, Pt. on medications + Peripheral Vascular Disease  Normal cardiovascular exam     Neuro/Psych  Headaches, negative psych ROS   GI/Hepatic Neg liver ROS, GERD  ,  Endo/Other  negative endocrine ROS  Renal/GU negative Renal ROS  negative genitourinary   Musculoskeletal  (+) Arthritis , Osteoarthritis,    Abdominal Normal abdominal exam  (+)   Peds negative pediatric ROS (+)  Hematology negative hematology ROS (+)   Anesthesia Other Findings Past Medical History: No date: Asthma     Comment:  AS A CHILD-NO INHALERS No date: Cervical spine degeneration No date: Chicken pox 12/08/2017: Cholelithiasis and acute cholecystitis without obstruction No date: Chronic back pain No date: Frequent headaches No date: GERD (gastroesophageal reflux disease) No date: Migraine   Reproductive/Obstetrics negative OB ROS                             Anesthesia Physical  Anesthesia Plan  ASA: II  Anesthesia Plan: General   Post-op Pain Management:    Induction: Intravenous  PONV Risk Score and Plan: Propofol infusion and TIVA  Airway Management Planned: Natural Airway and Simple Face Mask  Additional Equipment:   Intra-op Plan:   Post-operative Plan:   Informed Consent: I have reviewed the patients History and Physical, chart, labs and discussed the  procedure including the risks, benefits and alternatives for the proposed anesthesia with the patient or authorized representative who has indicated his/her understanding and acceptance.     Dental advisory given  Plan Discussed with: CRNA and Surgeon  Anesthesia Plan Comments:         Anesthesia Quick Evaluation

## 2019-08-06 NOTE — Op Note (Addendum)
  08/05/2019 - 08/06/2019  5:05 PM  PATIENT:  Zachary Khan  43 y.o. male  PRE-OPERATIVE DIAGNOSIS: Left leg  abscess  POST-OPERATIVE DIAGNOSIS:  Same  PROCEDURE:   1. Incision and drainage of complex abscess Left leg 2. Excisional  debridement of skin subcutaneous tissue and muscle measuring 9 square centimeters    SURGEON:  Surgeon(s) and Role:    * Pabon, Diego F, MD - Primary  EBL: 2cc   FINDINGS: Deep Left leg  Abscess, no evidence of necrotizing infection  DICTATION:  Patient was explained about the  Procedure in detail, risks, benefits and possible complications and a consent was obtained. The patient taken to the operating room and placed in the supine position.Elliptical incisiona was created and pus was drained and cultured. There was complex luculations that we we were able to lyse with a combination of finger fracture and suction device. All the loculations were broken down. Using a sharp curette we debrided the sub q tissue down to the muscle to include fascia. Hemostasis was obtained with electrocautery. Irrigation with normal saline and the wound was packed with half-inch packing and Kerlix wrap. Needle and laparotomy counts were correct and there were no immediate complications  Jules Husbands, MD

## 2019-08-06 NOTE — Anesthesia Post-op Follow-up Note (Signed)
Anesthesia QCDR form completed.        

## 2019-08-06 NOTE — Consult Note (Signed)
[facility] SUR[facility]ICAL CONSULTATION NOTE (initial) - cpt: 16109    HISTORY OF PRESENT ILLNESS (HPI):  43 y.o. male presented to [facility] ED yesterday (08/05/2019) for evaluation of left lower leg pain. Patient reported that had believes he had an insect bite to the lateral portion of his left lower leg about 5 days ago. The area was small for the first few days but over the 24 hours prior to presentation he reports that the area became increasingly more swollen and painful. He reports erythema of the leg tracking up his lateral thigh as well. Pain is worse with movement. Described as a aching and throbbing pain. Associated chills,. No fever, cough, congestion, CP, SOB, abdominal pain, n/v/d. No known MRSA history. No history of IVDA. No trauma to the area prior. Work up in the ED was concerning for leukocytosis to 18K and area of left lower leg cellulitis with small pocket of purulence on bedside US which was I&D'd in the ED. XR did not reveal any subcutaneous emphysema. He was admitted to medicine for IV Abx.   Today, he reports that he continues to notice pain and swelling to the left lower leg on the posterior aspect. He believes the erythema has improved but the swelling seems to have localized to one area. No fevers. Leukocytosis only mildly improved to 17K. Of note, he did eat breakfast at 030 but has been NPO since. 2  Surgery is consulted by hospitalist physician Dr. Enid Baas, MD in this context for evaluation and management of left lower leg cellulitis/abscess.   PAST MEDICAL HISTORY (PMH):  Past Medical History:  Diagnosis Date  . Asthma    AS A CHILD-NO INHALERS  . Cervical spine degeneration   . Chicken pox   . Cholelithiasis and acute cholecystitis without obstruction 12/08/2017  . Chronic back pain   . Frequent headaches   . GERD (gastroesophageal reflux disease)   . Migraine      PAST SURGICAL HISTORY (PSH):  Past Surgical History:  Procedure Laterality Date  .  CHOLECYSTECTOMY N/A 11/28/2017   Procedure: LAPAROSCOPIC CHOLECYSTECTOMY;  Surgeon: Ancil Linsey, MD;  Location: [facility] ORS;  Service: General;  Laterality: N/A;  . NO PAST SURGERIES       MEDICATIONS:  Prior to Admission medications   Medication Sig Start Date End Date Taking? Authorizing Provider  diclofenac (VOLTAREN) 75 MG EC tablet TAKE 1 TABLET(75 MG) BY MOUTH TWICE DAILY AS NEEDED FOR PAIN AND INFLAMMATION 05/13/19  Yes Lorre Munroe, NP  famotidine (PEPCID) 20 MG tablet Take 1 tablet (20 mg total) by mouth 2 (two) times daily. Patient taking differently: Take 40 mg by mouth daily.  08/29/18  Yes Sharman Cheek, MD  lisinopril-hydrochlorothiazide (PRINZIDE,ZESTORETIC) 20-25 MG tablet Take 1 tablet by mouth daily. For blood pressure. 09/25/18  Yes Doreene Nest, NP  Multiple Vitamin (MULTIVITAMIN WITH MINERALS) TABS tablet Take 1 tablet by mouth daily.   Yes [provider]  SUMAtriptan (IMITREX) 100 MG tablet Take 1 tablet at migraine onset. May repeat in 2 hours if no resolve. Do not exceed 2 tablets in 24 hours. 09/25/18  Yes Doreene Nest, NP  traZODone (DESYREL) 100 MG tablet TAKE 1 TABLET(100 MG) BY MOUTH AT BEDTIME FOR SLEEP Patient taking differently: Take 100 mg by mouth at bedtime as needed.  03/22/19  Yes Doreene Nest, NP  VENTOLIN HFA 108 (90 Base) MCG/ACT inhaler INHALE 2 PUFFS INTO THE LUNGS EVERY 6 HOURS AS NEEDED FOR WHEEZING OR SHORTNESS OF  BREATH 04/16/19  Yes Pleas Koch, NP  vitamin C (ASCORBIC ACID) 250 MG tablet Take 250 mg by mouth daily.   Yes [provider]     ALLERGIES:  Allergies  Allergen Reactions  . Hydrocodone-Acetaminophen Hives  . Sulfa Antibiotics Hives  . Sulfasalazine Hives  . Vicodin [Hydrocodone-Acetaminophen] Hives     SOCIAL HISTORY:  Social History   Socioeconomic History  . Marital status: Single    Spouse name: Not on file  . Number of children: Not on file  . Years of education: Not on  file  . Highest education level: Not on file  Occupational History  . Not on file  Social Needs  . Financial resource strain: Not on file  . Food insecurity    Worry: Not on file    Inability: Not on file  . Transportation needs    Medical: Not on file    Non-medical: Not on file  Tobacco Use  . Smoking status: Current Every Day Smoker    Packs/day: 0.25    Years: 20.00    Pack years: 5.00    Types: Cigarettes  . Smokeless tobacco: Never Used  Substance and Sexual Activity  . Alcohol use: Yes    Comment: 2 BEERS ON WEEKEND  . Drug use: Yes    Types: Marijuana    Comment: last smoked saturday  . Sexual activity: Not on file  Lifestyle  . Physical activity    Days per week: Not on file    Minutes per session: Not on file  . Stress: Not on file  Relationships  . Social Herbalist on phone: Not on file    Gets together: Not on file    Attends religious service: Not on file    Active member of club or organization: Not on file    Attends meetings of clubs or organizations: Not on file    Relationship status: Not on file  . Intimate partner violence    Fear of current or ex partner: Not on file    Emotionally abused: Not on file    Physically abused: Not on file    Forced sexual activity: Not on file  Other Topics Concern  . Not on file  Social History Narrative   Single.   4 children.   Works as a Armed forces technical officer.   Highest level of education: GED.     FAMILY HISTORY:  Family History  Problem Relation Age of Onset  . Arthritis Mother   . Heart disease Mother   . Hypertension Mother   . Alcohol abuse Father   . ALS Father   . Rheum arthritis Maternal Grandmother       REVIEW OF SYSTEMS:  Review of Systems  Constitutional: Positive for chills. Negative for fever.  HENT: Negative for congestion and sore throat.   Respiratory: Negative for cough and shortness of breath.   Cardiovascular: Negative for chest pain and palpitations.   Gastrointestinal: Negative for abdominal pain, diarrhea, nausea and vomiting.  Genitourinary: Negative for dysuria and urgency.  Musculoskeletal: Negative for falls.       + LLE Pain  Skin: Negative for itching.       + erythema, Swelling LLE  Neurological: Negative for headaches.  All other systems reviewed and are negative.   VITAL SIGNS:  Temp:  [98.3 F (36.8 C)-98.8 F (37.1 C)] 98.8 F (37.1 C) (10/13 0446) Pulse Rate:  [64-81] 81 (10/13 0900) Resp:  [15-20] 16 (10/13  0446) BP: (121-135)/(76-85) 135/76 (10/13 0900) SpO2:  [96 %-98 %] 97 % (10/13 0446)     Height: 5\' 7"  (170.2 cm) Weight: 81.6 kg BMI (Calculated): 28.19   INTAKE/OUTPUT:  10/12 0701 - 10/13 0700 In: 2416.1 [P.O.:480; I.V.:378.7; IV Piggyback:1557.4] Out: -   PHYSICAL EXAM:  Physical Exam Vitals signs and nursing note reviewed.  Constitutional:      General: He is not in acute distress.    Appearance: Normal appearance. He is not ill-appearing.  HENT:     Head: Normocephalic and atraumatic.  Eyes:     General: No scleral icterus.    Conjunctiva/sclera: Conjunctivae normal.  Cardiovascular:     Rate and Rhythm: Normal rate and regular rhythm.     Pulses: Normal pulses.     Heart sounds: No murmur. No friction rub. No gallop.   Pulmonary:     Effort: Pulmonary effort is normal. No respiratory distress.     Breath sounds: No wheezing or rhonchi.  Genitourinary:    Comments: Deferred Musculoskeletal:     Right lower leg: No edema.     Left lower leg: No edema.  Skin:    General: Skin is warm and dry.     Coloration: Skin is not pale.     Findings: Abscess and erythema present.       Neurological:     General: No focal deficit present.     Mental Status: He is alert and oriented to person, place, and time.  Psychiatric:        Mood and Affect: Mood normal.        Behavior: Behavior normal.      Labs:  CBC Latest Ref Rng & Units 08/06/2019 08/05/2019 07/30/2019  WBC 4.0 - 10.5 K/uL  17.2(H) 18.1(H) 8.1  Hemoglobin 13.0 - 17.0 g/dL 16.113.5 09.614.8 04.513.8  Hematocrit 39.0 - 52.0 % 38.8(L) 42.4 38.9(L)  Platelets 150 - 400 K/uL 260 310 283   CMP Latest Ref Rng & Units 08/06/2019 08/05/2019 07/30/2019  Glucose 70 - 99 mg/dL 99 91 409(W114(H)  BUN 6 - 20 mg/dL 11 12 12   Creatinine 0.61 - 1.24 mg/dL 1.190.94 1.470.89 8.290.86  Sodium 135 - 145 mmol/L 138 139 139  Potassium 3.5 - 5.1 mmol/L 3.1(L) 3.1(L) 3.0(L)  Chloride 98 - 111 mmol/L 103 103 102  CO2 22 - 32 mmol/L 26 27 27   Calcium 8.9 - 10.3 mg/dL 5.6(O8.6(L) 9.1 9.1  Total Protein 6.5 - 8.1 g/dL - - 6.1(L)  Total Bilirubin 0.3 - 1.2 mg/dL - - 1.1  Alkaline Phos 38 - 126 U/L - - 45  AST 15 - 41 U/L - - 18  ALT 0 - 44 U/L - - 31     Imaging studies:   XR Left Tibia/Fibula (08/06/2019) personally reviewed showing soft tissue swelling without any subcutaneous air or evidence of osteomyelitis, and radiologist report reviewed: IMPRESSION: No fracture or dislocation is noted. No lytic destruction is seen to suggest osteomyelitis. Soft tissue swelling is seen laterally over the proximal left calf suggesting infection. No radiopaque foreign body is noted.   Assessment/Plan: (ICD-10's: L03.119, L02.416) 43 y.o. male with persistent leukocytosis attributable likely localizing abscess to left lower leg with overlying cellulitis.   - NPO + IVF   - Continue IV Abx (Vancomycin + Zosyn)  - Follow up BCx; no growth x1 day  - Will plan on I&D in OR this afternoon with Dr Everlene FarrierPabon pending OR/Anesthesia availability; will culture wound    - All risks,  benefits, and alternatives to above procedure(s) were discussed with the patient, all of his questions were answered to his expressed satisfaction, patient expresses he wishes to proceed, and informed consent was obtained.  - further management per primary service  All of the above findings and recommendations were discussed with the patient, and all of patient's questions were answered to his expressed  satisfaction.  Thank you for the opportunity to participate in this patient's care.   -- Lynden Oxford, PA-C [facility] 08/06/2019, 11:43 AM [phone] M-F: 7am - 4pm

## 2019-08-06 NOTE — Telephone Encounter (Signed)
Zachary Khan with [employer] short term disability left v/m requesting cb with info; was pt removed from work on 07/30/19 for dizziness,blurred vision and high BP. Zachary Khan request diagnosis also. What are pts restrictions and how long will restrictions be in place.Claim # [id number].

## 2019-08-06 NOTE — Telephone Encounter (Signed)
Shirlean Mylar, will you take a look? See my office notes from 08/02/19. Restrictions would include rapid movements, frequent bending/pushing/pulling/lifting.  I referred him to ENT so I would assume restrictions for the next 2-3 weeks?

## 2019-08-06 NOTE — Telephone Encounter (Signed)
Paperwork in kate's in box  °For review and signature °

## 2019-08-06 NOTE — Progress Notes (Signed)
[facility] - [place] at [facility]   PATIENT NAME: Zachary Khan    MR#:  [id number]  DATE OF BIRTH:  07/20/1976  SUBJECTIVE:  CHIEF COMPLAINT:   Chief Complaint  Patient presents with  . Cellulitis  . Insect Bite    REVIEW OF SYSTEMS:  ROS Constitutional: Negative for chills and fever.  HENT: Negative for hearing loss and tinnitus.   Eyes: Negative for blurred vision and double vision.  Respiratory: Negative for cough and hemoptysis.   Cardiovascular: Negative for chest pain and palpitations.  Gastrointestinal: Negative for heartburn and nausea.  Genitourinary: Negative for dysuria and urgency.  Musculoskeletal: Negative for myalgias and neck pain.    Cellulitis with abscess of left leg  Skin: Negative for itching and rash.       Area of redness and swelling involving the left leg.  Neurological: Negative for dizziness and headaches.  Psychiatric/Behavioral: Negative for depression and hallucinations  DRUG ALLERGIES:   Allergies  Allergen Reactions  . Hydrocodone-Acetaminophen Hives  . Sulfa Antibiotics Hives  . Sulfasalazine Hives  . Vicodin [Hydrocodone-Acetaminophen] Hives   VITALS:  Blood pressure 135/76, pulse 81, temperature 98.8 F (37.1 C), temperature source Oral, resp. rate 16, height 5\' 7"  (1.702 m), weight 81.6 kg, SpO2 97 %. PHYSICAL EXAMINATION:  Physical Exam  GENERAL:  43 y.o.-year-old patient lying in the bed with no acute distress.  EYES: Pupils equal, round, reactive to light and accommodation. No scleral icterus. Extraocular muscles intact.  HEENT: Head atraumatic, normocephalic. Oropharynx and nasopharynx clear.  NECK:  Supple, no jugular venous distention. No thyroid enlargement, no tenderness.  LUNGS: Normal breath sounds bilaterally, no wheezing, rales,rhonchi or crepitation. No use of accessory muscles of respiration.  CARDIOVASCULAR: S1, S2 normal. No murmurs, rubs, or gallops.  ABDOMEN: Soft, nontender, nondistended.  Bowel sounds present. No organomegaly or mass.  EXTREMITIES: Area of swelling, redness and tenderness concerning for underlying abscess NEUROLOGIC: Cranial nerves II through XII are intact. Muscle strength 5/5 in all extremities. Sensation intact. Gait not checked.  PSYCHIATRIC: The patient is alert and oriented x 3.  SKIN: Area of redness, swelling tenderness on the left lower extremity suggestive of underlying abscess with surrounding cellulitis.  See picture below.  LABORATORY PANEL:  Male CBC Recent Labs  Lab 08/06/19 0607  WBC 17.2*  HGB 13.5  HCT 38.8*  PLT 260   ------------------------------------------------------------------------------------------------------------------ Chemistries  Recent Labs  Lab 07/30/19 1856  08/06/19 0607  NA 139   < > 138  K 3.0*   < > 3.1*  CL 102   < > 103  CO2 27   < > 26  GLUCOSE 114*   < > 99  BUN 12   < > 11  CREATININE 0.86   < > 0.94  CALCIUM 9.1   < > 8.6*  MG  --    < > 2.1  AST 18  --   --   ALT 31  --   --   ALKPHOS 45  --   --   BILITOT 1.1  --   --    < > = values in this interval not displayed.   RADIOLOGY:  No results found. ASSESSMENT AND PLAN:   Patient is a 43 year old male with history of hypertension, tobacco abuse and migraine headaches being admitted for left lower extremity abscess with cellulitis  1.  Left lower extremity abscess with surrounding area of cellulitis Patient status post I&D done by emergency room provider with minimal drainage.  Felt to be due to insect bite 5 days prior to admission. This morning area appears to be more swollen. I requested for surgery consult.  Seen by general surgery with plans for surgical debridement with deep wound cultures today.  Patient kept n.p.o. Continue broad-spectrum IV antibiotics with vancomycin and Zosyn for now.  Noted slight improvement in leukocytosis. Does not appear to be septic clinically IV fluid hydration. Pain control  2.  Hypertension Blood  pressure controlled.  Resume home blood pressure meds.  3.  Tobacco abuse Smokes about half a pack of cigarettes per day. Smoking cessation counseling done.  Motivated to quit.  Declined nicotine patch.  4.  History of migraine headaches Under control  DVT prophylaxis; Lovenox    All the records are reviewed and case discussed with Care Management/Social Worker. Management plans discussed with the patient, family and they are in agreement.  CODE STATUS: Full Code  TOTAL TIME TAKING CARE OF THIS PATIENT: 33 minutes.   More than 50% of the time was spent in counseling/coordination of care: YES  POSSIBLE D/C IN 3 DAYS, DEPENDING ON CLINICAL CONDITION.   Floella Ensz M.D on 08/06/2019 at 1:55 PM  Between 7am to 6pm - Pager - [phone]  After 6pm go to [email or web address] - Proofreader  [facility] [place] [facility]  Office  [phone]  CC: Primary care physician; Pleas Koch, NP  Note: This dictation was prepared with Dragon dictation along with smaller phrase technology. Any transcriptional errors that result from this process are unintentional.

## 2019-08-06 NOTE — Telephone Encounter (Signed)
Completed forms and placed on Robin's desk.

## 2019-08-06 NOTE — Transfer of Care (Signed)
Immediate Anesthesia Transfer of Care Note  Patient: Zachary Khan  Procedure(s) Performed: IRRIGATION AND DEBRIDEMENT ABSCESS Left Lower Leg (Left Leg Lower)  Patient Location: PACU  Anesthesia Type:General  Level of Consciousness: sedated  Airway & Oxygen Therapy: Patient Spontanous Breathing and Patient connected to face mask oxygen  Post-op Assessment: Report given to RN and Post -op Vital signs reviewed and stable  Post vital signs: Reviewed and stable  Last Vitals:  Vitals Value Taken Time  BP 98/65 08/06/19 1711  Temp 97.73F   Pulse 71 08/06/19 1713  Resp 14 08/06/19 1713  SpO2 99 % 08/06/19 1713  Vitals shown include unvalidated device data.  Last Pain:  Vitals:   08/06/19 1553  TempSrc:   PainSc: 7       Patients Stated Pain Goal: 0 (38/75/64 3329)  Complications: No apparent anesthesia complications

## 2019-08-07 ENCOUNTER — Encounter: Payer: Self-pay | Admitting: Surgery

## 2019-08-07 LAB — BASIC METABOLIC PANEL
Anion gap: 9 (ref 5–15)
BUN: 8 mg/dL (ref 6–20)
CO2: 27 mmol/L (ref 22–32)
Calcium: 8.6 mg/dL — ABNORMAL LOW (ref 8.9–10.3)
Chloride: 105 mmol/L (ref 98–111)
Creatinine, Ser: 1.13 mg/dL (ref 0.61–1.24)
GFR calc Af Amer: >60 mL/min (ref >60)
GFR calc non Af Amer: >60 mL/min (ref >60)
Glucose, Bld: 121 mg/dL — ABNORMAL HIGH (ref 70–99)
Potassium: 3.3 mmol/L — ABNORMAL LOW (ref 3.5–5.1)
Sodium: 141 mmol/L (ref 135–145)

## 2019-08-07 LAB — CBC
HCT: 36.6 % — ABNORMAL LOW (ref 39.0–52.0)
Hemoglobin: 12.7 g/dL — ABNORMAL LOW (ref 13.0–17.0)
MCH: 31.6 pg (ref 26.0–34.0)
MCHC: 34.7 g/dL (ref 30.0–36.0)
MCV: 91 fL (ref 80.0–100.0)
Platelets: 261 10*3/uL (ref 150–400)
RBC: 4.02 MIL/uL — ABNORMAL LOW (ref 4.22–5.81)
RDW: 11.9 % (ref 11.5–15.5)
WBC: 14.5 10*3/uL — ABNORMAL HIGH (ref 4.0–10.5)
nRBC: 0 % (ref 0.0–0.2)

## 2019-08-07 LAB — MAGNESIUM: Magnesium: 2 mg/dL (ref 1.7–2.4)

## 2019-08-07 MED ORDER — CLINDAMYCIN HCL 300 MG PO CAPS: 300.0000 mg | ORAL_CAPSULE | Freq: Three times a day (TID) | ORAL | 0 refills | Status: AC

## 2019-08-07 MED ORDER — OXYCODONE HCL 5 MG PO CAPS: 5.0000 mg | ORAL_CAPSULE | ORAL | 0 refills | Status: DC | PRN

## 2019-08-07 MED ORDER — HYDROMORPHONE HCL 1 MG/ML IJ SOLN
0.5000 mg | INTRAMUSCULAR | Status: DC | PRN
  Administered 2019-08-07 (×3): 0.5 mg via INTRAVENOUS
  Filled 2019-08-07 (×3): qty 1

## 2019-08-07 MED ORDER — RISAQUAD PO CAPS: 1.0000 | ORAL_CAPSULE | Freq: Every day | ORAL | 0 refills | Status: AC

## 2019-08-07 MED ORDER — VANCOMYCIN HCL IN DEXTROSE 1-5 GM/200ML-% IV SOLN
1000.0000 mg | Freq: Two times a day (BID) | INTRAVENOUS | Status: DC
  Administered 2019-08-07: 1000 mg via INTRAVENOUS
  Filled 2019-08-07 (×2): qty 200

## 2019-08-07 NOTE — TOC Initial Note (Signed)
Transition of Care [facility]) - Initial/Assessment Note    Patient Details  Name: Zachary Khan MRN: [id number] Date of Birth: 1976-07-02  Transition of Care [facility]) CM/SW Contact:    Shelbie Hutching, RN Phone Number: 08/07/2019, 2:10 PM  Clinical Narrative:                 Patient admitted with cellulitis after bug bite to left calf.  Patient had I & D yesterday.  Patient will need daily dressing changes at home.  Patient agrees to home health, referral given to [facility] with [place].  Patient reports that his finance can help with dressing changes at home.    Expected Discharge Plan: [place] Barriers to Discharge: Barriers Resolved   Patient Goals and CMS Choice   CMS Medicare.gov Compare Post Acute Care list provided to:: Patient Choice offered to / list presented to : Patient  Expected Discharge Plan and Services Expected Discharge Plan: [place]   Discharge Planning Services: CM Consult Post Acute Care Choice: [place] arrangements for the past 2 months: Single Family Home Expected Discharge Date: 08/07/19                         HH Arranged: RN [place] Agency: [place] ([place]) Date [place]: 08/07/19 Time [place]: [place] Representative spoke with at [place]: Isac Caddy  Prior Living Arrangements/Services Living arrangements for the past 2 months: [place] with:: Significant Other Patient language and need for interpreter reviewed:: No Do you feel safe going back to the place where you live?: Yes      Need for Family Participation in Patient Care: Yes (Comment)(help with wound care) Care giver support system in place?: Yes (comment)(finance)   Criminal Activity/Legal Involvement Pertinent to Current Situation/Hospitalization: No - Comment as needed  Activities of Daily Living Home Assistive Devices/Equipment: None ADL Screening (condition at  time of admission) Patient's cognitive ability adequate to safely complete daily activities?: Yes Is the patient deaf or have difficulty hearing?: No Does the patient have difficulty seeing, even when wearing glasses/contacts?: No Does the patient have difficulty concentrating, remembering, or making decisions?: No Patient able to express need for assistance with ADLs?: Yes Does the patient have difficulty dressing or bathing?: No Independently performs ADLs?: Yes (appropriate for developmental age) Does the patient have difficulty walking or climbing stairs?: No Weakness of Legs: None Weakness of Arms/Hands: None  Permission Sought/Granted Permission sought to share information with : Case Manager, Other (comment) Permission granted to share information with : Yes, Verbal Permission Granted     Permission granted to share info w AGENCY: [facility]        Emotional Assessment Appearance:: Appears stated age Attitude/Demeanor/Rapport: Engaged Affect (typically observed): Accepting Orientation: : Oriented to Self, Oriented to Place, Oriented to  Time, Oriented to Situation Alcohol / Substance Use: Tobacco Use Psych Involvement: No (comment)  Admission diagnosis:  Left leg cellulitis [W09.811] Other elevated white blood cell (WBC) count [D72.828] Patient Active Problem List   Diagnosis Date Noted  . Cellulitis and abscess of left leg 08/05/2019  . Dizziness 07/29/2019  . Suspected COVID-19 virus infection 04/11/2019  . GERD (gastroesophageal reflux disease) 02/22/2019  . Asthma 02/22/2019  . 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 Insomnia 08/14/2018  . Preventative health care 08/14/2018  . Essential hypertension 11/17/2017  . History of cholecystectomy 11/13/2017  .  Migraines 06/01/2016  . Chronic back pain 06/01/2016  . Chronic neck pain 06/01/2016   PCP:  Doreene Nest, NP Pharmacy:   [facility] [phone] [place], [place] - [address] [place] [address] Phone: [phone] Fax: [phone]  [id number] [place], [place] - [address] AT [facility] [address] [place] [address] Phone: [phone] Fax: [phone]     Social Determinants of Health (SDOH) Interventions    Readmission Risk Interventions No flowsheet data found.

## 2019-08-07 NOTE — Discharge Summary (Signed)
[employer] - [place] at [facility]   PATIENT NAME: Zachary Khan    MR#:  [id number]  DATE OF BIRTH:  11/10/1975  DATE OF ADMISSION:  08/05/2019 ADMITTING PHYSICIAN: Jama Flavors, MD  DATE OF DISCHARGE: 08/07/2019  PRIMARY CARE PHYSICIAN: Doreene Nest, NP    ADMISSION DIAGNOSIS:  Left leg cellulitis [L03.116] Other elevated white blood cell (WBC) count [D72.828]  DISCHARGE DIAGNOSIS:  Active Problems:   Cellulitis and abscess of left leg   SECONDARY DIAGNOSIS:   Past Medical History:  Diagnosis Date  . Asthma    AS A CHILD-NO INHALERS  . Cervical spine degeneration   . Chicken pox   . Cholelithiasis and acute cholecystitis without obstruction 12/08/2017  . Chronic back pain   . Frequent headaches   . GERD (gastroesophageal reflux disease)   . Migraine     HOSPITAL COURSE:  43 year old male with tobacco dependence and essential hypertension who presented to the emergency room due to swelling and erythema of the left leg.  1.  Left lower extremity abscess with surrounding area of cellulitis: Patient is status post I&D by ED physician as well as surgical services. Patient was on broad-spectrum antibiotics and will be discharged on oral clindamycin. He will continue packing the wound.  He will have outpatient follow-up with surgery within 1 week.  White blood cell count has improved. Continue pain management as needed.  2. Tobacco dependence: Patient is encouraged to quit smoking and willing to attempt to quit was assessed. Patient highly motivated.Counseling was provided for 4 minutes.  3. Essential HTN: Continue Prinzide  DISCHARGE CONDITIONS AND DIET:  Stable  Regular diet  CONSULTS OBTAINED:    DRUG ALLERGIES:   Allergies  Allergen Reactions  . Hydrocodone-Acetaminophen Hives  . Sulfa Antibiotics Hives  . Sulfasalazine Hives  . Vicodin [Hydrocodone-Acetaminophen] Hives    DISCHARGE MEDICATIONS:   Allergies as of 08/07/2019       Reactions   Hydrocodone-acetaminophen Hives   Sulfa Antibiotics Hives   Sulfasalazine Hives   Vicodin [hydrocodone-acetaminophen] Hives      Medication List    TAKE these medications   acidophilus Caps capsule Take 1 capsule by mouth daily for 14 days.   clindamycin 300 MG capsule Commonly known as: CLEOCIN Take 1 capsule (300 mg total) by mouth 3 (three) times daily for 10 days.   diclofenac 75 MG EC tablet Commonly known as: VOLTAREN TAKE 1 TABLET(75 MG) BY MOUTH TWICE DAILY AS NEEDED FOR PAIN AND INFLAMMATION   famotidine 20 MG tablet Commonly known as: PEPCID Take 1 tablet (20 mg total) by mouth 2 (two) times daily. What changed:   how much to take  when to take this   lisinopril-hydrochlorothiazide 20-25 MG tablet Commonly known as: ZESTORETIC Take 1 tablet by mouth daily. For blood pressure.   multivitamin with minerals Tabs tablet Take 1 tablet by mouth daily.   oxycodone 5 MG capsule Commonly known as: OXY-IR Take 1 capsule (5 mg total) by mouth every 4 (four) hours as needed.   SUMAtriptan 100 MG tablet Commonly known as: Imitrex Take 1 tablet at migraine onset. May repeat in 2 hours if no resolve. Do not exceed 2 tablets in 24 hours.   traZODone 100 MG tablet Commonly known as: DESYREL TAKE 1 TABLET(100 MG) BY MOUTH AT BEDTIME FOR SLEEP What changed: See the new instructions.   Ventolin HFA 108 (90 Base) MCG/ACT inhaler Generic drug: albuterol INHALE 2 PUFFS INTO THE LUNGS EVERY 6 HOURS AS NEEDED  FOR WHEEZING OR SHORTNESS OF BREATH   vitamin C 250 MG tablet Commonly known as: ASCORBIC ACID Take 250 mg by mouth daily.         Today   CHIEF COMPLAINT:  Patient with some leg pain  But improvement in cellulitis   VITAL SIGNS:  Blood pressure (!) 142/81, pulse 76, temperature 99.1 F (37.3 C), temperature source Oral, resp. rate 15, height 5\' 7"  (1.702 m), weight 81.7 kg, SpO2 96 %.   REVIEW OF SYSTEMS:  Review of Systems   Constitutional: Negative.  Negative for chills, fever and malaise/fatigue.  HENT: Negative.  Negative for ear discharge, ear pain, hearing loss, nosebleeds and sore throat.   Eyes: Negative.  Negative for blurred vision and pain.  Respiratory: Negative.  Negative for cough, hemoptysis, shortness of breath and wheezing.   Cardiovascular: Negative.  Negative for chest pain, palpitations and leg swelling.  Gastrointestinal: Negative.  Negative for abdominal pain, blood in stool, diarrhea, nausea and vomiting.  Genitourinary: Negative.  Negative for dysuria.  Musculoskeletal: Positive for joint pain. Negative for back pain.  Skin: Negative.        Cellulitis Adela Glimpse/absess improved  Neurological: Negative for dizziness, tremors, speech change, focal weakness, seizures and headaches.  Endo/Heme/Allergies: Negative.  Does not bruise/bleed easily.  Psychiatric/Behavioral: Negative.  Negative for depression, hallucinations and suicidal ideas.     PHYSICAL EXAMINATION:  GENERAL:  43 y.o.-year-old patient lying in the bed with no acute distress.  NECK:  Supple, no jugular venous distention. No thyroid enlargement, no tenderness.  LUNGS: Normal breath sounds bilaterally, no wheezing, rales,rhonchi  No use of accessory muscles of respiration.  CARDIOVASCULAR: S1, S2 normal. No murmurs, rubs, or gallops.  ABDOMEN: Soft, non-tender, non-distended. Bowel sounds present. No organomegaly or mass.  EXTREMITIES: No pedal edema, cyanosis, or clubbing.  PSYCHIATRIC: The patient is alert and oriented x 3.  SKIN: wound packed improved erythema.   DATA REVIEW:   CBC Recent Labs  Lab 08/07/19 0556  WBC 14.5*  HGB 12.7*  HCT 36.6*  PLT 261    Chemistries  Recent Labs  Lab 08/07/19 0556  NA 141  K 3.3*  CL 105  CO2 27  GLUCOSE 121*  BUN 8  CREATININE 1.13  CALCIUM 8.6*  MG 2.0    Cardiac Enzymes No results for input(s): TROPONINI in the last 168 hours.  Microbiology Results   @MICRORSLT48 @  RADIOLOGY:  No results found.    Allergies as of 08/07/2019      Reactions   Hydrocodone-acetaminophen Hives   Sulfa Antibiotics Hives   Sulfasalazine Hives   Vicodin [hydrocodone-acetaminophen] Hives      Medication List    TAKE these medications   acidophilus Caps capsule Take 1 capsule by mouth daily for 14 days.   clindamycin 300 MG capsule Commonly known as: CLEOCIN Take 1 capsule (300 mg total) by mouth 3 (three) times daily for 10 days.   diclofenac 75 MG EC tablet Commonly known as: VOLTAREN TAKE 1 TABLET(75 MG) BY MOUTH TWICE DAILY AS NEEDED FOR PAIN AND INFLAMMATION   famotidine 20 MG tablet Commonly known as: PEPCID Take 1 tablet (20 mg total) by mouth 2 (two) times daily. What changed:   how much to take  when to take this   lisinopril-hydrochlorothiazide 20-25 MG tablet Commonly known as: ZESTORETIC Take 1 tablet by mouth daily. For blood pressure.   multivitamin with minerals Tabs tablet Take 1 tablet by mouth daily.   oxycodone 5 MG capsule Commonly known  as: OXY-IR Take 1 capsule (5 mg total) by mouth every 4 (four) hours as needed.   SUMAtriptan 100 MG tablet Commonly known as: Imitrex Take 1 tablet at migraine onset. May repeat in 2 hours if no resolve. Do not exceed 2 tablets in 24 hours.   traZODone 100 MG tablet Commonly known as: DESYREL TAKE 1 TABLET(100 MG) BY MOUTH AT BEDTIME FOR SLEEP What changed: See the new instructions.   Ventolin HFA 108 (90 Base) MCG/ACT inhaler Generic drug: albuterol INHALE 2 PUFFS INTO THE LUNGS EVERY 6 HOURS AS NEEDED FOR WHEEZING OR SHORTNESS OF BREATH   vitamin C 250 MG tablet Commonly known as: ASCORBIC ACID Take 250 mg by mouth daily.        }   Management plans discussed with the patient and she is in agreement. Stable for discharge   Patient should follow up with surgery  CODE STATUS:     Code Status Orders  (From admission, onward)         Start     Ordered    08/05/19 1021  Full code  Continuous     08/05/19 1021        Code Status History    This patient has a current code status but no historical code status.   Advance Care Planning Activity      TOTAL TIME TAKING CARE OF THIS PATIENT: 38 minutes.    Note: This dictation was prepared with Dragon dictation along with smaller phrase technology. Any transcriptional errors that result from this process are unintentional.  Bettey Costa M.D on 08/07/2019 at 10:47 AM  Between 7am to 6pm - Pager - [phone] After 6pm go to [email or web address] - Proofreader  [facility] [place] [facility]  Office  [phone]  CC: Primary care physician; Pleas Koch, NP

## 2019-08-07 NOTE — Progress Notes (Signed)
Pt d/c to home via girlfriend. IVs removed intact. Education completed. Pt and girlfriend taught how to pack and dress wound. Wound supplies given to pt. All belongings sent with pt.

## 2019-08-07 NOTE — Consult Note (Signed)
Pharmacy Antibiotic Note  Zachary Khan is a 43 y.o. male admitted on 08/05/2019 with cellulitis.  Pharmacy has been consulted for Vancomycin/Zosyn dosing. Pt received Vancomycin 2000mg  x 1 in ED, and has been receiving 1250mg  IV every 12 hours.   Plan: Zosyn 3.375g IV q8h (4 hour infusion).  Scr slowly increasing. Will adjust dose to Vancomycin 1000 mg IV Q 12 hrs. Goal AUC 400-550. Expected AUC: 482 SCr used: 1.13   Height: 5\' 7"  (170.2 cm) Weight: 180 lb 0.9 oz (81.7 kg) IBW/kg (Calculated) : 66.1  Temp (24hrs), Avg:97.9 F (36.6 C), Min:97.5 F (36.4 C), Max:99.1 F (37.3 C)  Recent Labs  Lab 08/05/19 0605 08/05/19 0649 08/05/19 1015 08/06/19 0607 08/07/19 0556  WBC 18.1*  --   --  17.2* 14.5*  CREATININE 0.89  --   --  0.94 1.13  LATICACIDVEN  --  1.4 1.1  --   --     Estimated Creatinine Clearance: 87.1 mL/min (by C-G formula based on SCr of 1.13 mg/dL).    Allergies  Allergen Reactions  . Hydrocodone-Acetaminophen Hives  . Sulfa Antibiotics Hives  . Sulfasalazine Hives  . Vicodin [Hydrocodone-Acetaminophen] Hives    Antimicrobials this admission: 10/12 Ceftriaxone 2g x 1  Vancomycin 10/12 >>  Zosyn 10/12 >>  Dose adjustments this admission: None  Microbiology results: 10/12 BCx: NG x 2 days  10/12 Wound Cx: GPC in pairs/clusters   Thank you for allowing pharmacy to be a part of this patient's care.  Pernell Dupre, PharmD, BCPS Clinical Pharmacist 08/07/2019 8:57 AM

## 2019-08-07 NOTE — Progress Notes (Signed)
S/p I/D  Having pain ( not surprisingly given his hx) Lower Ext soft, cellulitis improving no evidence of necrotizing infection  A/p Doing well He will need daily packing RN taught about it Needs outpt coverage for MRSA We will see him as outpt

## 2019-08-08 ENCOUNTER — Telehealth: Payer: Self-pay | Admitting: Primary Care

## 2019-08-08 NOTE — Telephone Encounter (Signed)
Transition Care Management Follow-up Telephone Call   Date discharged? 08/07/2019   How have you been since you were released from the hospital? "Im still having pain in my leg"  - taking Clindamycin and Percocet - seems to be having break through pain. "Pain is keeping me awake at night. I am taking the Percocet every 4 hours as directed and this does not seem like enough"   Do you understand why you were in the hospital? yes   Do you understand the discharge instructions? yes   Where were you discharged to? Home with [facility]   Items Reviewed:  Medications reviewed: yes  Allergies reviewed: yes  Dietary changes reviewed: yes  Referrals reviewed: yes   Functional Questionnaire:   Activities of Daily Living (ADLs):   He states they are independent in the following: ambulation, bathing and hygiene, feeding, continence, grooming, toileting, dressing and keeping leg elevated and trying to limit walking as much as possible. States they require assistance with the following: Wound checks and dressing changes - [place] comes out to help with this. Wound care training to be given to patient and Fiance so that they will in the future be able to dress this and care for it.    Any transportation issues/concerns?: no   Any patient concerns? yes, Just pain in leg, Rx'd Percocet 4 times a day -- not working   Confirmed importance and date/time of follow-up visits scheduled yes  Provider Appointment booked with Gentry Fitz at 11:40am  Confirmed with patient if condition begins to worsen call PCP or go to the ER.  Patient was given the office number and encouraged to call back with question or concerns.  : yes

## 2019-08-08 NOTE — Telephone Encounter (Signed)
Leg is draining and bleeding as to be expected since surgery.  Pain is excruciating at times.  Pt states that at times pain is 10/10 to where he feels faint.   Taking Percocet 4 times a day as directed - not seeming to help. Only has 10 day supply Still on abx Clindamycin (given 10 day supply) Pt received 2 abx while admitted via IV  Requesting something be called in stronger or dose increased on Percocet.  Pt aware of upcoming appt in office with Gentry Fitz on 08/14/2019 at 11:40 (arrive at 11:20)  [facility] and [employer].  Allergies  Allergen Reactions  . Hydrocodone-Acetaminophen Hives  . Sulfa Antibiotics Hives  . Sulfasalazine Hives  . Vicodin [Hydrocodone-Acetaminophen] Hives

## 2019-08-09 ENCOUNTER — Telehealth: Payer: Self-pay | Admitting: *Deleted

## 2019-08-09 NOTE — Telephone Encounter (Signed)
Since I just saw him he doesn't need follow up with me.  Based off of his discharge paperwork he will be seeing his surgeon on 08/22/19. Needs to call surgeon for something else for pain if no improvement with percocet.

## 2019-08-09 NOTE — Telephone Encounter (Signed)
Left message for Zachary Khan

## 2019-08-09 NOTE — Telephone Encounter (Signed)
Approved.  

## 2019-08-09 NOTE — Telephone Encounter (Signed)
Misty nurse with [facility] left a voicemail stating that the patient was discharged from the hospital. [facility] stated that they received an order for wound care. Misty stated that she would like approval for plan of care for wound care and monitoring for a leg wound. Misty requested a call back with okay.

## 2019-08-09 NOTE — Telephone Encounter (Signed)
Message left for patient to return my call.  

## 2019-08-10 LAB — CULTURE, BLOOD (ROUTINE X 2)
Culture: NO GROWTH
Culture: NO GROWTH
Special Requests: ADEQUATE

## 2019-08-12 ENCOUNTER — Telehealth: Payer: Self-pay | Admitting: *Deleted

## 2019-08-12 LAB — AEROBIC/ANAEROBIC CULTURE W GRAM STAIN (SURGICAL/DEEP WOUND)

## 2019-08-12 NOTE — Telephone Encounter (Signed)
Approved.  

## 2019-08-12 NOTE — Telephone Encounter (Signed)
Misty Nurse with [facility] is calling back to requested a new set of verbal orders for the patient    She is requesting Nursing orders for the patient   1 week 1 2 week 1 1 week 2   Misty's C/B # [phone]

## 2019-08-12 NOTE — Telephone Encounter (Signed)
Spoke with patient. He was instructed to take ibupropen and tylenol and became angry and hung up on me during the conversation.

## 2019-08-12 NOTE — Telephone Encounter (Signed)
Gave the apporval for the verbal orders.

## 2019-08-12 NOTE — Telephone Encounter (Signed)
Spoken and notified patient of Zachary Khan comments. Patient verbalized understanding. Already cancel the appointment for 08/14/2019

## 2019-08-12 NOTE — Telephone Encounter (Signed)
Patient had surgery on 08/06/19 by Dr.Pabon I&D left lower leg and is still in pain and wants to get another refill on oxycodone. Please call and advise

## 2019-08-13 ENCOUNTER — Other Ambulatory Visit: Payer: Self-pay | Admitting: Internal Medicine

## 2019-08-13 ENCOUNTER — Telehealth: Payer: Self-pay

## 2019-08-13 DIAGNOSIS — G8929 Other chronic pain: Secondary | ICD-10-CM

## 2019-08-13 DIAGNOSIS — M25539 Pain in unspecified wrist: Secondary | ICD-10-CM

## 2019-08-13 MED ORDER — GABAPENTIN 300 MG PO CAPS: 300.0000 mg | ORAL_CAPSULE | Freq: Three times a day (TID) | ORAL | 0 refills | Status: DC

## 2019-08-13 NOTE — Telephone Encounter (Signed)
Spoke with the patient's fiance, Earnest Bailey, and he will call back to schedule a post op visit with Thedore Mins. He would like to try the Gabapentin for pain relief. This prescription has been sent into his pharmacy.

## 2019-08-13 NOTE — Telephone Encounter (Signed)
-----   Message from Jules Husbands, MD sent at 08/12/2019  5:39 PM EDT ----- Regarding: RE: pain management I have reviewed his record. He had 30 pills of oxycodone that only lasted him 5 days. I am concerned about potential narcotic addiction misuse and I won;t be able to refill his narcotics over the phone. HE may come to the office for face to face visit w Thedore Mins this week. May call him gabapenting 300 mg po q 8hrs # 20 if he agrees to that . DP ----- Message ----- From: Lesly Rubenstein, LPN Sent: 10/26/7251   4:24 PM EDT To: Jules Husbands, MD Subject: pain management                                The patient's fiance, [facility], called and would like you to call her regarding pain management for this patient post operatively. He had called and spoken with Freda Munro earlier today(see telephone encounter) and was not happy with what she told him.

## 2019-08-13 NOTE — Telephone Encounter (Signed)
Last filled 05/13/2019 by Garnette Gunner, please advise if appropriate to refill

## 2019-08-14 ENCOUNTER — Ambulatory Visit: Payer: 59 | Admitting: Primary Care

## 2019-08-14 NOTE — Telephone Encounter (Signed)
Refill request denied.  Managed on gabapentin and oxycodone now.

## 2019-08-22 ENCOUNTER — Encounter: Payer: Self-pay | Admitting: Physician Assistant

## 2019-08-22 ENCOUNTER — Other Ambulatory Visit: Payer: Self-pay

## 2019-08-22 ENCOUNTER — Ambulatory Visit (INDEPENDENT_AMBULATORY_CARE_PROVIDER_SITE_OTHER): Payer: Self-pay | Admitting: Physician Assistant

## 2019-08-22 VITALS — BP 142/87 | HR 80 | Temp 97.5°F | Resp 16 | Ht 67.0 in | Wt 185.0 lb

## 2019-08-22 DIAGNOSIS — Z09 Encounter for follow-up examination after completed treatment for conditions other than malignant neoplasm: Secondary | ICD-10-CM

## 2019-08-22 DIAGNOSIS — L02416 Cutaneous abscess of left lower limb: Secondary | ICD-10-CM

## 2019-08-22 NOTE — Progress Notes (Signed)
[facility] POST-OP OFFICE VISIT  08/22/2019  HPI: Zachary Khan is a 43 y.o. male 16 days s/p incision and drainage for left lower extremity abscess with Dr Dahlia Byes.   Today he reports that he continues to improve. No fever or chills. [facility] RN following for wound care. No issues. Pain is better. Only notices sharp pains and soreness with over exertion. Gabapentin and Ibuprofen controlling pain.   Vital signs: BP (!) 142/87   Pulse 80   Temp (!) 97.5 F (36.4 C) (Temporal)   Resp 16   Ht 5\' 7"  (1.702 m)   Wt 185 lb (83.9 kg)   SpO2 97%   BMI 28.98 kg/m    Physical Exam: Constitutional: Well appearing male, NAD Skin: Incision to left lateral mid-calf is well healing, beefy red granulation tissue in the wound base, no erythema or drainage  Assessment/Plan: This is a 43 y.o. male 16 days s/p incision and drainage for left lower extremity abscess   - pain control prn  - continue dry dressings prn; may not need packing after this week  - return to work discussed; plan for Nov 9; discussed keeping wound covered  - return precautions given  - follow up prn   -- Edison Simon, PA-C [facility] 08/22/2019, 9:45 AM [phone] M-F: 7am - 4pm

## 2019-08-22 NOTE — Patient Instructions (Signed)
Please pack the wound for a few more days. You may shower as usual. Keep a dry dressing over the area until it heals  completely.

## 2019-08-27 ENCOUNTER — Encounter: Payer: Self-pay | Admitting: Primary Care

## 2019-08-27 ENCOUNTER — Ambulatory Visit (INDEPENDENT_AMBULATORY_CARE_PROVIDER_SITE_OTHER): Payer: 59 | Admitting: Primary Care

## 2019-08-27 ENCOUNTER — Other Ambulatory Visit: Payer: Self-pay

## 2019-08-27 DIAGNOSIS — I10 Essential (primary) hypertension: Secondary | ICD-10-CM | POA: Diagnosis not present

## 2019-08-27 DIAGNOSIS — R42 Dizziness and giddiness: Secondary | ICD-10-CM

## 2019-08-27 NOTE — Assessment & Plan Note (Signed)
Above goal in the office today, also on prior visits. Given dizziness symptoms off of Amlodipine it doesn't seem that this was contributing.   Resume Amlodipine daily. Continue lisinopril-HCTZ. He will monitor BP.

## 2019-08-27 NOTE — Progress Notes (Signed)
Subjective:    Patient ID: Zachary Khan, male    DOB: 01/06/1976, 43 y.o.   MRN: [id number]  HPI  [name] is a 43 year old male with a history of hypertension, migraines, asthma, chronic neck and back pain, insomnia, dizziness, cellulitis and abscess of lower extremity who presents today with a chief complaint of dizziness.  He was last evaluated in our office on 08/02/19 with persistent dizzy spells that were constant but worse with positional changes. Prior to that visit we held his Amlodipine 10 mg to ensure he wasn't experiencing postural hypotension. We continued his lisinopril-HCTZ. Prior to his last visit he was seen in the ED for chest pain and fatigue, also underwent MRI of the head which was unremarkable.  Despite holding his Amlodipine, during his last visit he reported continued dizziness and room spinning sensations so we sent him to ENT for further evaluation.   Today he endorses continued dizziness that is now intermittent and more mild. He was told by ENT that he doesn't have positional vertigo. He does have headaches that have been present for months. He's not been taking his Amlodipine but doesn't think that it was contributing to dizziness.   He was taking gabapentin for his cellulitis and endorses that he didn't have dizziness for the entire week of his seven day course. Since completing gabapentin he's noticed the dizziness has returned but is more mild and tolerable. He feels confident that he can return to work, he has been cleared by his Careers advisersurgeon to return on November 9th, 2020. He is needing a note today.  BP Readings from Last 3 Encounters:  08/27/19 (!) 144/98  08/22/19 (!) 142/87  08/07/19 (!) 142/81     Review of Systems  Constitutional: Negative for fever.  Eyes: Negative for visual disturbance.  Respiratory: Negative for shortness of breath.   Cardiovascular: Negative for chest pain.  Neurological: Positive for dizziness and light-headedness.        Past Medical History:  Diagnosis Date  . Asthma    AS A CHILD-NO INHALERS  . Cervical spine degeneration   . Chicken pox   . Cholelithiasis and acute cholecystitis without obstruction 12/08/2017  . Chronic back pain   . Frequent headaches   . GERD (gastroesophageal reflux disease)   . Migraine      Social History   Socioeconomic History  . Marital status: Single    Spouse name: Not on file  . Number of children: Not on file  . Years of education: Not on file  . Highest education level: Not on file  Occupational History  . Not on file  Social Needs  . Financial resource strain: Not on file  . Food insecurity    Worry: Not on file    Inability: Not on file  . Transportation needs    Medical: Not on file    Non-medical: Not on file  Tobacco Use  . Smoking status: Current Every Day Smoker    Packs/day: 0.25    Years: 20.00    Pack years: 5.00    Types: Cigarettes  . Smokeless tobacco: Never Used  Substance and Sexual Activity  . Alcohol use: Yes    Comment: 2 BEERS ON WEEKEND  . Drug use: Yes    Types: Marijuana  . Sexual activity: Not on file  Lifestyle  . Physical activity    Days per week: Not on file    Minutes per session: Not on file  . Stress: Not  on file  Relationships  . Social Herbalist on phone: Not on file    Gets together: Not on file    Attends religious service: Not on file    Active member of club or organization: Not on file    Attends meetings of clubs or organizations: Not on file    Relationship status: Not on file  . Intimate partner violence    Fear of current or ex partner: Not on file    Emotionally abused: Not on file    Physically abused: Not on file    Forced sexual activity: Not on file  Other Topics Concern  . Not on file  Social History Narrative   Single.   4 children.   Works as a Armed forces technical officer.   Highest level of education: GED.    Past Surgical History:  Procedure Laterality Date  . CHOLECYSTECTOMY  N/A 11/28/2017   Procedure: LAPAROSCOPIC CHOLECYSTECTOMY;  Surgeon: Vickie Epley, MD;  Location: [facility] ORS;  Service: General;  Laterality: N/A;  . IRRIGATION AND DEBRIDEMENT ABSCESS Left 08/06/2019   Procedure: IRRIGATION AND DEBRIDEMENT ABSCESS Left Lower Leg;  Surgeon: Jules Husbands, MD;  Location: [facility] ORS;  Service: General;  Laterality: Left;  . NO PAST SURGERIES      Family History  Problem Relation Age of Onset  . Arthritis Mother   . Heart disease Mother   . Hypertension Mother   . Alcohol abuse Father   . ALS Father   . Rheum arthritis Maternal Grandmother     Allergies  Allergen Reactions  . Hydrocodone-Acetaminophen Hives  . Sulfa Antibiotics Hives  . Sulfasalazine Hives  . Vicodin [Hydrocodone-Acetaminophen] Hives    Current Outpatient Medications on File Prior to Visit  Medication Sig Dispense Refill  . amLODipine (NORVASC) 10 MG tablet Take 10 mg by mouth daily.    . diclofenac (VOLTAREN) 75 MG EC tablet TAKE 1 TABLET(75 MG) BY MOUTH TWICE DAILY AS NEEDED FOR PAIN AND INFLAMMATION 180 tablet 0  . famotidine (PEPCID) 20 MG tablet Take 1 tablet (20 mg total) by mouth 2 (two) times daily. (Patient taking differently: Take 40 mg by mouth daily. ) 60 tablet 0  . lisinopril-hydrochlorothiazide (PRINZIDE,ZESTORETIC) 20-25 MG tablet Take 1 tablet by mouth daily. For blood pressure. 90 tablet 3  . Multiple Vitamin (MULTIVITAMIN WITH MINERALS) TABS tablet Take 1 tablet by mouth daily.    . SUMAtriptan (IMITREX) 100 MG tablet Take 1 tablet at migraine onset. May repeat in 2 hours if no resolve. Do not exceed 2 tablets in 24 hours. 10 tablet 0  . traZODone (DESYREL) 100 MG tablet TAKE 1 TABLET(100 MG) BY MOUTH AT BEDTIME FOR SLEEP (Patient taking differently: Take 100 mg by mouth at bedtime as needed. ) 90 tablet 1  . VENTOLIN HFA 108 (90 Base) MCG/ACT inhaler INHALE 2 PUFFS INTO THE LUNGS EVERY 6 HOURS AS NEEDED FOR WHEEZING OR SHORTNESS OF BREATH 18 g 0  . vitamin C  (ASCORBIC ACID) 250 MG tablet Take 250 mg by mouth daily.     No current facility-administered medications on file prior to visit.     BP (!) 144/98   Pulse 78   Temp 97.7 F (36.5 C) (Temporal)   Ht 5\' 7"  (1.702 m)   Wt 187 lb 8 oz (85 kg)   SpO2 97%   BMI 29.37 kg/m    Objective:   Physical Exam  Constitutional: He appears well-nourished.  Neck: Neck supple.  Cardiovascular:  Normal rate and regular rhythm.  Respiratory: Effort normal and breath sounds normal.  Neurological:  Moved from chair to exam table without difficulty or dizziness.   Skin: Skin is warm and dry.  Psychiatric: He has a normal mood and affect.           Assessment & Plan:

## 2019-08-27 NOTE — Assessment & Plan Note (Addendum)
Improved overall, ready to return to work. Evaluated by ENT who determined symptoms not to be from Vertigo. Offered Neurology evaluation for which he kindly declines. He will schedule an eye exam.  Work note provided to return on November 9th per surgeon's recommendations. He will update if symptoms progress. He will resume all antihypertensive medications.

## 2019-08-27 NOTE — Patient Instructions (Signed)
Resume Amlodipine 10 mg daily for blood pressure. Continue taking lisinopril-hydrochlorothiazide for blood pressure.  You can try Meclizine for dizziness, this may cause drowsiness.   Ensure you are consuming 64 ounces of water daily.  Schedule an eye exam as discussed.  It was a pleasure to see you today!

## 2019-09-14 ENCOUNTER — Other Ambulatory Visit: Payer: Self-pay | Admitting: Primary Care

## 2019-09-14 DIAGNOSIS — G4700 Insomnia, unspecified: Secondary | ICD-10-CM

## 2019-09-14 DIAGNOSIS — I10 Essential (primary) hypertension: Secondary | ICD-10-CM

## 2019-10-09 ENCOUNTER — Other Ambulatory Visit: Payer: 59

## 2019-11-18 ENCOUNTER — Other Ambulatory Visit: Payer: Self-pay | Admitting: Primary Care

## 2019-12-22 ENCOUNTER — Encounter: Payer: Self-pay | Admitting: Emergency Medicine

## 2019-12-22 ENCOUNTER — Ambulatory Visit (INDEPENDENT_AMBULATORY_CARE_PROVIDER_SITE_OTHER): Payer: BLUE CROSS/BLUE SHIELD

## 2019-12-22 ENCOUNTER — Ambulatory Visit
Admission: EM | Admit: 2019-12-22 | Discharge: 2019-12-22 | Disposition: A | Discharge status: Home / Self Care | Payer: BLUE CROSS/BLUE SHIELD | Attending: Emergency Medicine | Admitting: Emergency Medicine

## 2019-12-22 ENCOUNTER — Other Ambulatory Visit: Payer: Self-pay

## 2019-12-22 DIAGNOSIS — W208XXA Other cause of strike by thrown, projected or falling object, initial encounter: Secondary | ICD-10-CM

## 2019-12-22 DIAGNOSIS — M25511 Pain in right shoulder: Secondary | ICD-10-CM

## 2019-12-22 DIAGNOSIS — S40011A Contusion of right shoulder, initial encounter: Secondary | ICD-10-CM

## 2019-12-22 MED ORDER — MELOXICAM 15 MG PO TABS: 15.0000 mg | ORAL_TABLET | Freq: Every day | ORAL | 0 refills | Status: DC

## 2019-12-22 NOTE — ED Triage Notes (Signed)
Patient states that at work on Wed a plate fell from above him and landed on right shoulder.  Patient states that he saw his Occupational nurse and doctor on Wed and had a televisit for his work related injury on Friday.  Patient states that he has been out of work Thursday and Friday.  Patient states that he called out of work on Saturday.  Patient states that he tried to go back to work today but states that he was still having pain in his right shoulder.

## 2019-12-22 NOTE — ED Provider Notes (Signed)
[facility]    CSN: [id number] Arrival date & time: 12/22/19  1340      History   Chief Complaint Chief Complaint  Patient presents with  . Shoulder Pain    right    HPI Zachary Khan is a 45 y.o. male.   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  He was seen by an occupational nurse and doctor on Wednesday and underwent a televisit for his work-related injury on Friday.  Visit provider advised him that there did not appear to be any deformity and told him he can go back to work.  Patient had Thursday and Friday off and called out of work on Saturday because of continuing pain in the shoulder.  He is right-hand dominant.  He states he tried to go back to work today but that was not able to keep up with the work requirements and left.  His pain seems to be sharply located over the right [facility] joint area.  Difficulty raising his arm above the level of the shoulder on the right-hand side.  Does not complain of any neck pain or stiffness.  Has noticed numbness and tingling in his right hand but in a nondermatomal distribution of all 4 fingers.        Past Medical History:  Diagnosis Date  . Asthma    AS A CHILD-NO INHALERS  . Cervical spine degeneration   . Chicken pox   . Cholelithiasis and acute cholecystitis without obstruction 12/08/2017  . Chronic back pain   . Frequent headaches   . GERD (gastroesophageal reflux disease)   . Migraine     Patient Active Problem List   Diagnosis Date Noted  . Cellulitis and abscess of left leg 08/05/2019  . Dizziness 07/29/2019  . Suspected COVID-19 virus infection 04/11/2019  . GERD (gastroesophageal reflux disease) 02/22/2019  . Asthma 02/22/2019  . 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 Insomnia 08/14/2018  . Preventative health care 08/14/2018  .  Essential hypertension 11/17/2017  . History of cholecystectomy 11/13/2017  . Migraines 06/01/2016  . Chronic back pain 06/01/2016  . Chronic neck pain 06/01/2016    Past Surgical History:  Procedure Laterality Date  . CHOLECYSTECTOMY N/A 11/28/2017   Procedure: LAPAROSCOPIC CHOLECYSTECTOMY;  Surgeon: Ancil Linsey, MD;  Location: [facility] ORS;  Service: General;  Laterality: N/A;  . IRRIGATION AND DEBRIDEMENT ABSCESS Left 08/06/2019   Procedure: IRRIGATION AND DEBRIDEMENT ABSCESS Left Lower Leg;  Surgeon: Leafy Ro, MD;  Location: [facility] OR[facility];  Service: General;  Laterality: Left;  . NO PAST SURGERIES         Home Medications    Prior to Admission medications   Medication Sig Start Date End Date Taking? Authorizing Provider  amLODipine (NORVASC) 10 MG tablet TAKE 1 TABLET(10 MG) BY MOUTH DAILY FOR BLOOD PRESSURE 11/18/19  Yes Doreene Nest, NP  famotidine (PEPCID) 20 MG tablet Take 1 tablet (20 mg total) by mouth 2 (two) times daily. Patient taking differently: Take 40 mg by mouth daily.  08/29/18  Yes Sharman Cheek, MD  lisinopril-hydrochlorothiazide (ZESTORETIC) 20-25 MG tablet TAKE 1 TABLET BY MOUTH DAILY FOR BLOOD PRESSURE 09/16/19  Yes Doreene Nest, NP  Multiple Vitamin (MULTIVITAMIN WITH MINERALS) TABS tablet Take 1 tablet by mouth daily.  Yes [provider]  SUMAtriptan (IMITREX) 100 MG tablet Take 1 tablet at migraine onset. May repeat in 2 hours if no resolve. Do not exceed 2 tablets in 24 hours. 09/25/18  Yes Doreene Nest, NP  vitamin C (ASCORBIC ACID) 250 MG tablet Take 250 mg by mouth daily.   Yes [provider]  diclofenac (VOLTAREN) 75 MG EC tablet TAKE 1 TABLET(75 MG) BY MOUTH TWICE DAILY AS NEEDED FOR PAIN AND INFLAMMATION 05/13/19   Lorre Munroe, NP  meloxicam (MOBIC) 15 MG tablet Take 1 tablet (15 mg total) by mouth daily. 12/22/19   Lutricia Feil, PA-C  traZODone (DESYREL) 100 MG tablet TAKE 1 TABLET(100 MG) BY MOUTH AT  BEDTIME FOR SLEEP 09/16/19 12/22/19  Doreene Nest, NP  VENTOLIN HFA 108 (90 Base) MCG/ACT inhaler INHALE 2 PUFFS INTO THE LUNGS EVERY 6 HOURS AS NEEDED FOR WHEEZING OR SHORTNESS OF BREATH 04/16/19 12/22/19  Doreene Nest, NP    Family History Family History  Problem Relation Age of Onset  . Arthritis Mother   . Heart disease Mother   . Hypertension Mother   . Alcohol abuse Father   . ALS Father   . Rheum arthritis Maternal Grandmother     Social History Social History   Tobacco Use  . Smoking status: Current Every Day Smoker    Packs/day: 0.25    Years: 20.00    Pack years: 5.00    Types: Cigarettes  . Smokeless tobacco: Never Used  Substance Use Topics  . Alcohol use: Yes    Comment: 2 BEERS ON WEEKEND  . Drug use: Yes    Types: Marijuana     Allergies   Hydrocodone-acetaminophen, Sulfa antibiotics, Sulfasalazine, and Vicodin [hydrocodone-acetaminophen]   Review of Systems Review of Systems  Constitutional: Positive for activity change. Negative for appetite change, chills, diaphoresis, fatigue and fever.  Musculoskeletal: Positive for arthralgias and myalgias. Negative for neck pain and neck stiffness.  All other systems reviewed and are negative.    Physical Exam Triage Vital Signs ED Triage Vitals  Enc Vitals Group     BP 12/22/19 1413 122/90     Pulse Rate 12/22/19 1413 69     Resp 12/22/19 1413 16     Temp 12/22/19 1413 98.3 F (36.8 C)     Temp Source 12/22/19 1413 Oral     SpO2 12/22/19 1413 98 %     Weight 12/22/19 1410 195 lb (88.5 kg)     Height 12/22/19 1410 5\' 8"  (1.727 m)     Head Circumference --      Peak Flow --      Pain Score 12/22/19 1409 5     Pain Loc --      Pain Edu? --      Excl. in GC? --    No data found.  Updated Vital Signs BP 122/90 (BP Location: Right Arm)   Pulse 69   Temp 98.3 F (36.8 C) (Oral)   Resp 16   Ht 5\' 8"  (1.727 m)   Wt 195 lb (88.5 kg)   SpO2 98%   BMI 29.65 kg/m   Visual Acuity Right  Eye Distance:   Left Eye Distance:   Bilateral Distance:    Right Eye Near:   Left Eye Near:    Bilateral Near:     Physical Exam Vitals and nursing note reviewed.  Constitutional:      General: He is not in acute distress.  Appearance: Normal appearance. He is normal weight. He is not ill-appearing or toxic-appearing.  HENT:     Head: Normocephalic and atraumatic.  Eyes:     Conjunctiva/sclera: Conjunctivae normal.  Musculoskeletal:        General: Tenderness and signs of injury present. No swelling or deformity.     Cervical back: Normal range of motion and neck supple. No rigidity or tenderness.     Comments: Examination of the right shoulder shows no deformity crepitus or tenderness about the clavicle.  Patient's maximum tenderness over the [facility] joint.  Patient has limited range of motion on the right passively to 45 degrees abduction 30 degrees extension 20degrees flexion normal internal rotation to 90 degrees external rotation to 45 degrees.  Skin:    General: Skin is warm and dry.  Neurological:     General: No focal deficit present.     Mental Status: He is alert and oriented to person, place, and time.  Psychiatric:        Mood and Affect: Mood normal.        Behavior: Behavior normal.        Thought Content: Thought content normal.        Judgment: Judgment normal.      UC Treatments / Results  Labs (all labs ordered are listed, but only abnormal results are displayed) Labs Reviewed - No data to display  EKG   Radiology DG Shoulder Right  Result Date: 12/22/2019 CLINICAL DATA:  Pain EXAM: RIGHT SHOULDER - 2+ VIEW COMPARISON:  07/11/2016 FINDINGS: There is no acute displaced fracture or dislocation. 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 The osseous mineralization is within normal limits. IMPRESSION: Negative. Electronically Signed   By: Katherine Mantle M.D.   On: 12/22/2019 14:57    Procedures Procedures (including critical care  time)  Medications Ordered in UC Medications - No data to display  Initial Impression / Assessment and Plan / UC Course  I have reviewed the triage vital signs and the nursing notes.  Pertinent labs & imaging results that were available during my care of the patient were reviewed by me and considered in my medical decision making (see chart for details).   44 year old male sustained an injury to his right shoulder 4 days ago when a large block of ice fell onto his right shoulder superiorly over the [facility] joint.  Since that time he has had increasing pain and decreased range of motion.  He has undergone TeleMed visit but this has not been helpful.  Examination today showed decreased range of motion and tenderness maximal over the right AC joint.  An x-ray was obtained and independently reviewed by myself and no fracture dislocation.  He did have some mild glenohumeral joint degenerative arthritis present.  This is confirmed by radiology.  Instructed him in pendulum exercises to avoid frozen shoulder.  He should limit the use of his right upper extremity for the next week by not lifting pushing or pulling 15 pounds or greater.  Placed him on Mobic 15 mg daily with food.  If he continues to have pain after a week he will be seen by [facility] occupational health.  May return to work tomorrow with the above restrictions.  Final Clinical Impressions(s) / UC Diagnoses   Final diagnoses:  Contusion of right shoulder, initial encounter     Discharge Instructions     Apply ice 20 minutes out of every 2 hours 4-5 times daily for comfort.     ED  Prescriptions    Medication Sig Dispense Auth. Provider   meloxicam (MOBIC) 15 MG tablet Take 1 tablet (15 mg total) by mouth daily. 30 tablet Lorin Picket, PA-C     PDMP not reviewed this encounter.   Lorin Picket, PA-C 12/22/19 1523

## 2019-12-22 NOTE — Discharge Instructions (Addendum)
Apply ice 20 minutes out of every 2 hours 4-5 times daily for comfort.  °

## 2019-12-30 ENCOUNTER — Ambulatory Visit
Admission: EM | Admit: 2019-12-30 | Discharge: 2019-12-30 | Discharge status: Absent without leave | Payer: PRIVATE HEALTH INSURANCE

## 2019-12-30 ENCOUNTER — Other Ambulatory Visit: Payer: Self-pay

## 2020-01-03 ENCOUNTER — Ambulatory Visit: Payer: PRIVATE HEALTH INSURANCE | Attending: Internal Medicine

## 2020-01-03 DIAGNOSIS — Z20822 Contact with and (suspected) exposure to covid-19: Secondary | ICD-10-CM

## 2020-01-04 LAB — NOVEL CORONAVIRUS, NAA: SARS-CoV-2, NAA: NOT DETECTED

## 2020-04-21 MED ORDER — AMLODIPINE BESYLATE 10 MG PO TABS: ORAL_TABLET | ORAL | 1 refills | Status: DC

## 2020-06-17 HISTORY — PX: TOTAL SHOULDER ARTHROPLASTY: SHX126

## 2020-07-06 ENCOUNTER — Other Ambulatory Visit: Payer: Self-pay

## 2020-07-06 MED ORDER — AMLODIPINE BESYLATE 10 MG PO TABS: ORAL_TABLET | ORAL | 0 refills | Status: DC

## 2020-07-06 NOTE — Telephone Encounter (Signed)
Pt schedule virtual appointment 9/15 He is out of amlodipine And has been out since Thursday  [facility] [address] [place]   Best number  [phone]

## 2020-07-08 ENCOUNTER — Encounter: Payer: Self-pay | Admitting: Primary Care

## 2020-07-08 ENCOUNTER — Telehealth (INDEPENDENT_AMBULATORY_CARE_PROVIDER_SITE_OTHER): Payer: BLUE CROSS/BLUE SHIELD | Admitting: Primary Care

## 2020-07-08 ENCOUNTER — Other Ambulatory Visit: Payer: Self-pay

## 2020-07-08 VITALS — BP 120/75 | HR 84 | Ht 68.0 in | Wt 195.0 lb

## 2020-07-08 DIAGNOSIS — I10 Essential (primary) hypertension: Secondary | ICD-10-CM

## 2020-07-08 DIAGNOSIS — Z9889 Other specified postprocedural states: Secondary | ICD-10-CM | POA: Diagnosis not present

## 2020-07-08 DIAGNOSIS — G43701 Chronic migraine without aura, not intractable, with status migrainosus: Secondary | ICD-10-CM | POA: Diagnosis not present

## 2020-07-08 MED ORDER — LOSARTAN POTASSIUM 50 MG PO TABS: 50.0000 mg | ORAL_TABLET | Freq: Every day | ORAL | 0 refills | Status: DC

## 2020-07-08 NOTE — Assessment & Plan Note (Signed)
No migraine since BP under control. Continue to monitor.

## 2020-07-08 NOTE — Patient Instructions (Signed)
Stop taking lisinopril-HCTZ as discussed.  Start taking losartan 50 mg once daily for blood pressure. Continue taking amlodipine 10 mg once daily for blood pressure.   Schedule a lab appointment for one week as discussed.  Monitor your blood pressure as discussed, notify me if you see readings at or above 135/90.   It was a pleasure to see you today! Mayra Reel, NP-C

## 2020-07-08 NOTE — Progress Notes (Signed)
Subjective:    Patient ID: Zachary Khan, male    DOB: [birth year], 44 y.o.   MRN: [id number]  HPI  Virtual Visit via Video Note  I connected with Zachary Khan on 07/08/20 at  9:40 AM EDT by a video enabled telemedicine application and verified that I am speaking with the correct person using two identifiers.  Location: Patient: Home Provider: Office Participants: Patient and myself   I discussed the limitations of evaluation and management by telemedicine and the availability of in person appointments. The patient expressed understanding and agreed to proceed.  History of Present Illness:  Mr. Guglielmo is a 44 year old male patient with a history of hypertension, migraines, asthma, GERD, chronic back pain, chronic neck pain, cellulitis and abscess, cholecystectomy who presents today to discuss blood pressure and also for general follow up.  1) Essential Hypertension: He is currently managed on lisinopril-HCTZ 20-25 mg and amlodipine 10 mg for hypertension. He was recently told by his surgeon to have the HCTZ removed from his medication regimen due to lower potassium levels.   Potassium levels over the last year have ranged in the low 3.0's. He's been taking potassium supplements OTC. He stopped his lisinopril-HCTZ for one week to see if his potassium normalized, he was told to stop by his surgeon, his surgeon has not yet checked his potassium level. He actually resumed his lisinopril-HCTZ a few days ago. He is checking his BP at home which is running "normal".   2) Chronic Pain: Chronic neck, back, and shoulder pain. 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 Overall still has pain to the right shoulder, will be starting physical therapy in 2 weeks. Following with [facility].   3) Migraines: Currently managed on sumatriptan 100 mg for which he takes sparingly as needed. He's had no migraines since we treated his hypertension. No recent use of Imitrex.     Observations/Objective:  Alert and oriented. Appears well, not sickly. No distress. Speaking in complete sentences.   Assessment and Plan:  See problem based charting.   Follow Up Instructions:  Stop taking lisinopril-HCTZ as discussed.  Start taking losartan 50 mg once daily for blood pressure. Continue taking amlodipine 10 mg once daily for blood pressure.   Schedule a lab appointment for one week as discussed.  Monitor your blood pressure as discussed, notify me if you see readings at or above 135/90.   It was a pleasure to see you today! Mayra Reel, NP-C    I discussed the assessment and treatment plan with the patient. The patient was provided an opportunity to ask questions and all were answered. The patient agreed with the plan and demonstrated an understanding of the instructions.   The patient was advised to call back or seek an in-person evaluation if the symptoms worsen or if the condition fails to improve as anticipated.    Doreene Nest, NP    Review of Systems  Eyes: Negative for visual disturbance.  Respiratory: Negative for shortness of breath.   Cardiovascular: Negative for chest pain.  Musculoskeletal: Positive for arthralgias.  Neurological: Negative for dizziness and headaches.       Past Medical History:  Diagnosis Date  . Asthma    AS A CHILD-NO INHALERS  . Cervical spine degeneration   . Chicken pox   . Cholelithiasis and acute cholecystitis without obstruction 12/08/2017  . Chronic back pain   . Frequent headaches   . GERD (gastroesophageal reflux disease)   .  Migraine      Social History   Socioeconomic History  . Marital status: Single    Spouse name: Not on file  . Number of children: Not on file  . Years of education: Not on file  . Highest education level: Not on file  Occupational History  . Not on file  Tobacco Use  . Smoking status: Current Every Day Smoker    Packs/day: 0.25    Years: 20.00    Pack years:  5.00    Types: Cigarettes  . Smokeless tobacco: Never Used  Vaping Use  . Vaping Use: Never used  Substance and Sexual Activity  . Alcohol use: Yes    Comment: 2 BEERS ON WEEKEND  . Drug use: Yes    Types: Marijuana  . Sexual activity: Not on file  Other Topics Concern  . Not on file  Social History Narrative   Single.   4 children.   Works as a Retail banker.   Highest level of education: GED.   Social Determinants of Health   Financial Resource Strain:   . Difficulty of Paying Living Expenses: Not on file  Food Insecurity:   . Worried About Programme researcher, broadcasting/film/video in the Last Year: Not on file  . Ran Out of Food in the Last Year: Not on file  Transportation Needs:   . Lack of Transportation (Medical): Not on file  . Lack of Transportation (Non-Medical): Not on file  Physical Activity:   . Days of Exercise per Week: Not on file  . Minutes of Exercise per Session: Not on file  Stress:   . Feeling of Stress : Not on file  Social Connections:   . Frequency of Communication with Friends and Family: Not on file  . Frequency of Social Gatherings with Friends and Family: Not on file  . Attends Religious Services: Not on file  . Active Member of Clubs or Organizations: Not on file  . Attends Banker Meetings: Not on file  . Marital Status: Not on file  Intimate Partner Violence:   . Fear of Current or Ex-Partner: Not on file  . Emotionally Abused: Not on file  . Physically Abused: Not on file  . Sexually Abused: Not on file    Past Surgical History:  Procedure Laterality Date  . CHOLECYSTECTOMY N/A 11/28/2017   Procedure: LAPAROSCOPIC CHOLECYSTECTOMY;  Surgeon: Ancil Linsey, MD;  Location: [facility] ORS;  Service: General;  Laterality: N/A;  . IRRIGATION AND DEBRIDEMENT ABSCESS Left 08/06/2019   Procedure: IRRIGATION AND DEBRIDEMENT ABSCESS Left Lower Leg;  Surgeon: Leafy Ro, MD;  Location: [facility] ORS;  Service: General;  Laterality: Left;  . NO PAST  SURGERIES      Family History  Problem Relation Age of Onset  . Arthritis Mother   . Heart disease Mother   . Hypertension Mother   . Alcohol abuse Father   . ALS Father   . Rheum arthritis Maternal Grandmother     Allergies  Allergen Reactions  . Hydrocodone-Acetaminophen Hives  . Sulfa Antibiotics Hives  . Sulfasalazine Hives  . Vicodin [Hydrocodone-Acetaminophen] Hives    Current Outpatient Medications on File Prior to Visit  Medication Sig Dispense Refill  . amLODipine (NORVASC) 10 MG tablet TAKE 1 TABLET(10 MG) BY MOUTH DAILY FOR BLOOD PRESSURE 90 tablet 0  . diclofenac (VOLTAREN) 75 MG EC tablet TAKE 1 TABLET(75 MG) BY MOUTH TWICE DAILY AS NEEDED FOR PAIN AND INFLAMMATION 180 tablet 0  . famotidine (PEPCID)  20 MG tablet Take 1 tablet (20 mg total) by mouth 2 (two) times daily. (Patient taking differently: Take 40 mg by mouth daily. ) 60 tablet 0  . meloxicam (MOBIC) 15 MG tablet Take 1 tablet (15 mg total) by mouth daily. 30 tablet 0  . Multiple Vitamin (MULTIVITAMIN WITH MINERALS) TABS tablet Take 1 tablet by mouth daily.    . SUMAtriptan (IMITREX) 100 MG tablet Take 1 tablet at migraine onset. May repeat in 2 hours if no resolve. Do not exceed 2 tablets in 24 hours. 10 tablet 0  . vitamin C (ASCORBIC ACID) 250 MG tablet Take 250 mg by mouth daily.    . [DISCONTINUED] traZODone (DESYREL) 100 MG tablet TAKE 1 TABLET(100 MG) BY MOUTH AT BEDTIME FOR SLEEP 90 tablet 1  . [DISCONTINUED] VENTOLIN HFA 108 (90 Base) MCG/ACT inhaler INHALE 2 PUFFS INTO THE LUNGS EVERY 6 HOURS AS NEEDED FOR WHEEZING OR SHORTNESS OF BREATH 18 g 0   No current facility-administered medications on file prior to visit.    BP 120/75   Pulse 84   Ht 5\' 8"  (1.727 m)   Wt 195 lb (88.5 kg)   BMI 29.65 kg/m    Objective:   Physical Exam Pulmonary:     Effort: Pulmonary effort is normal.  Neurological:     Mental Status: He is alert and oriented to person, place, and time.  Psychiatric:         Mood and Affect: Mood normal.            Assessment & Plan:

## 2020-07-08 NOTE — Assessment & Plan Note (Signed)
Controlled on current regimen, but do agree that we need to eliminate HCTZ due to effects of hypokalemia.  Stop lisinopril-HCTZ. Continue amlodipine 10 mg. Start losartan 50 mg.  Repeat BMP in one week. He will monitor BP and update if running at or above 135/90.

## 2020-07-08 NOTE — Assessment & Plan Note (Signed)
S/p right total shoulder replacement in late August 2021, following with [facility].

## 2020-07-09 MED ORDER — LOSARTAN POTASSIUM 50 MG PO TABS: 50.0000 mg | ORAL_TABLET | Freq: Every day | ORAL | 0 refills | Status: DC

## 2020-07-15 ENCOUNTER — Other Ambulatory Visit: Payer: BLUE CROSS/BLUE SHIELD

## 2020-07-15 DIAGNOSIS — Z20822 Contact with and (suspected) exposure to covid-19: Secondary | ICD-10-CM

## 2020-07-16 LAB — SARS-COV-2, NAA 2 DAY TAT

## 2020-07-16 LAB — NOVEL CORONAVIRUS, NAA: SARS-CoV-2, NAA: NOT DETECTED

## 2020-09-07 ENCOUNTER — Other Ambulatory Visit: Payer: Self-pay | Admitting: Primary Care

## 2020-09-07 DIAGNOSIS — I10 Essential (primary) hypertension: Secondary | ICD-10-CM

## 2020-09-08 IMAGING — DX PORTABLE CHEST - 1 VIEW
1 series · 1 of 1 positions shown · non-contrast
Comparison: Chest x-ray 11/13/2017

CLINICAL DATA: Headache, weakness and low-grade fever.

EXAM:
PORTABLE CHEST 1 VIEW

[chest ap]
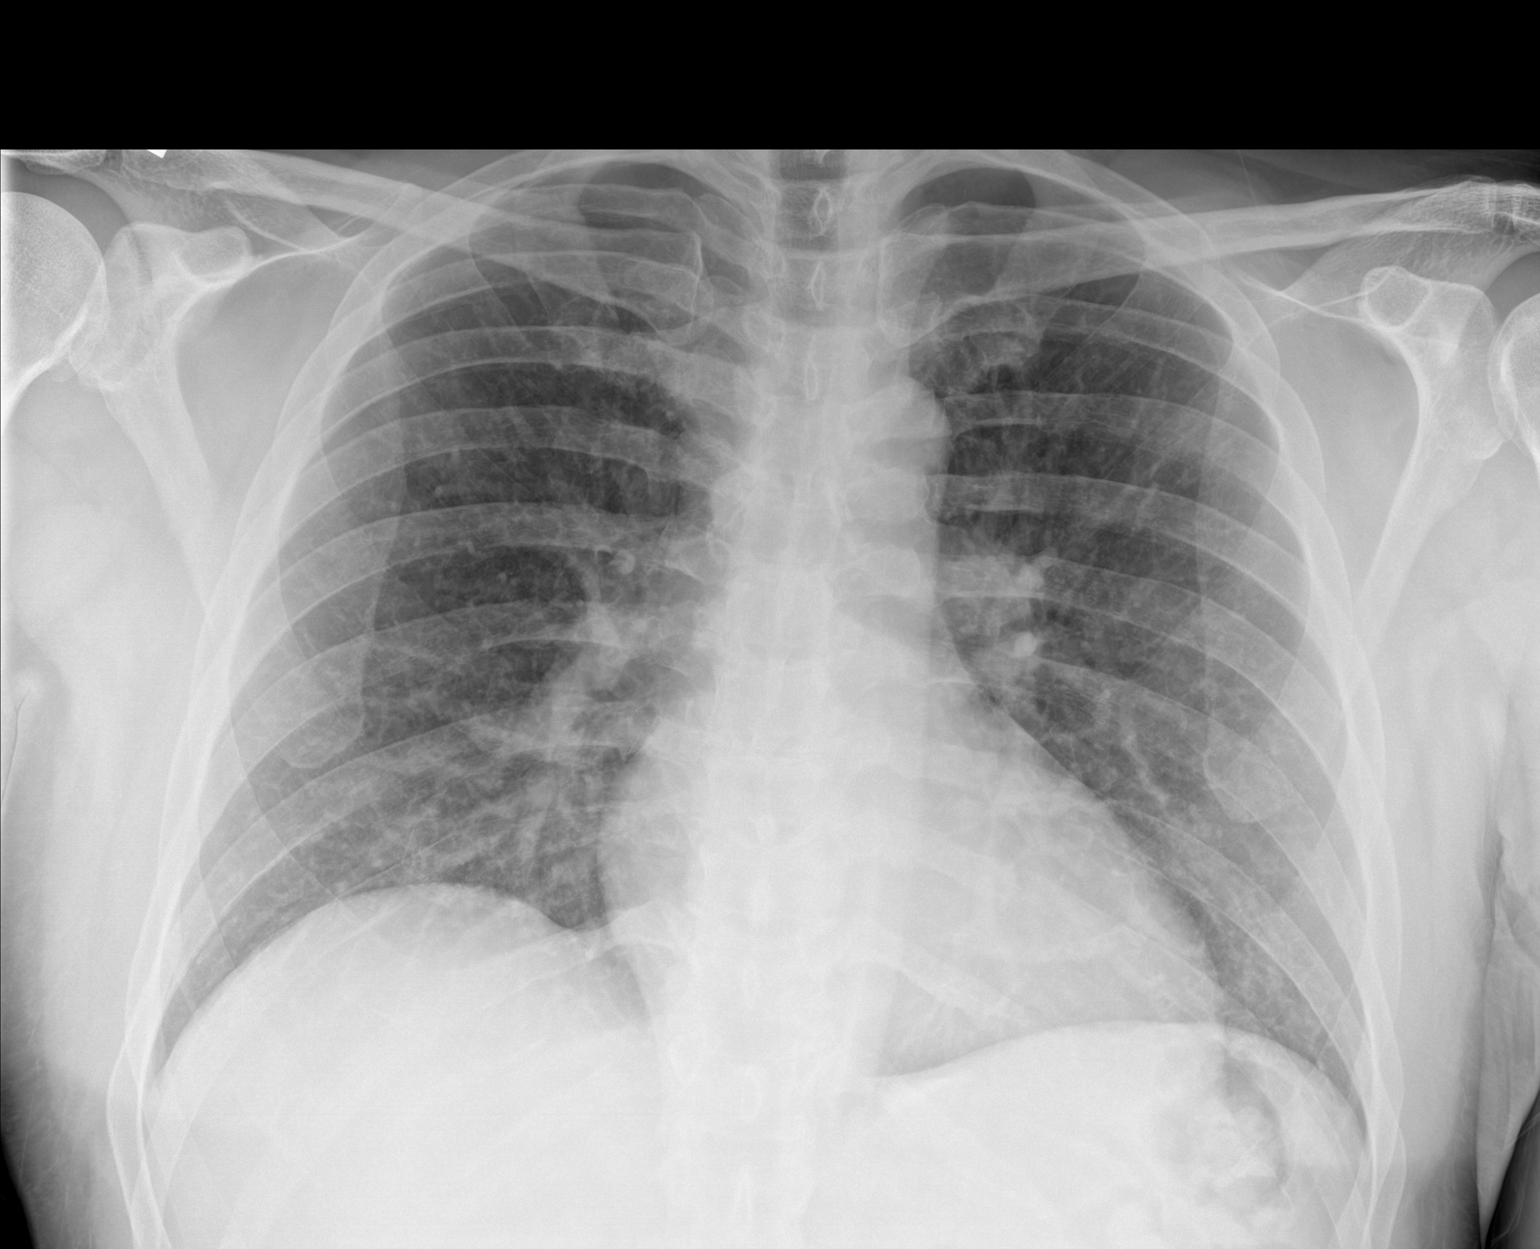

[1 of 1 positions shown; findings below may reference images not displayed]

FINDINGS: The cardiac silhouette, mediastinal and hilar contours are within
normal limits given the AP projection and portable technique. Low
lung volumes with vascular crowding and streaky atelectasis but no
infiltrates, edema or effusions. No worrisome pulmonary lesions. The
bony thorax is intact.
IMPRESSION: No acute cardiopulmonary findings.

## 2020-11-20 DIAGNOSIS — I10 Essential (primary) hypertension: Secondary | ICD-10-CM

## 2020-11-23 MED ORDER — LOSARTAN POTASSIUM 50 MG PO TABS: ORAL_TABLET | ORAL | 1 refills | Status: DC

## 2020-11-23 MED ORDER — AMLODIPINE BESYLATE 10 MG PO TABS: ORAL_TABLET | ORAL | 1 refills | Status: DC

## 2020-12-18 ENCOUNTER — Encounter: Payer: Self-pay | Admitting: Primary Care

## 2020-12-18 ENCOUNTER — Telehealth: Payer: Self-pay

## 2020-12-18 NOTE — Telephone Encounter (Signed)
Called patient past due for follow up. States he has been busy with other appointments. Let him know we will do everything we can to find something convenient for him. Agreed to appointment. Started working on it but call was disconnected. I have called back and l/m to call office to set up.

## 2020-12-24 NOTE — Telephone Encounter (Signed)
Called patient to schedule visit. Unable to LVM due to being full. Will attempt again later.

## 2020-12-25 NOTE — Telephone Encounter (Signed)
Called patient to schedule visit. Unable to LVM due to being full. Letter sent.

## 2021-02-01 ENCOUNTER — Telehealth: Payer: Self-pay | Admitting: *Deleted

## 2021-02-01 ENCOUNTER — Ambulatory Visit: Payer: BLUE CROSS/BLUE SHIELD | Admitting: Family Medicine

## 2021-02-01 NOTE — Telephone Encounter (Signed)
Have some slots we can work him in. Have called and l/m to call office.

## 2021-02-01 NOTE — Telephone Encounter (Signed)
Please work him in, provide a 30 day supply of losartan to get him in with [place]. Please stress that he must be seen for further refills.  Is he out of amlodipine too? If so then give 30 days for this as well.

## 2021-02-01 NOTE — Telephone Encounter (Signed)
Patient left a voicemail at 8:28 stating that he was not going to be able to make his appointment at 8:30 because he can't find his keys. Patient stated that he wants to reschedule his appointment as soon as possible. Called patient back and was advised that he has been out of his blood pressure medication for over two weeks and that is when he started feeling a little lightheaded at times. Patient stated that his blood pressure has been elevated and averaging 135/90 since being out of his blood pressure medication. Patient stated that he has felt off-balanced at times and gets real hot. Patient stated that this happens when he gets busy and hot. Patient stated when he sits down for a few minutes he feels better. Patient denies a headache, chest pain or difficulty breathing. Patient stated that he may be having a little anxiety. Patient was offered an appointment tomorrow which he declined stating that he has an appointment in [place] at the pain clinic. Patient stated that he needs a refill on his Losartan. Patient stated that he was told that he needs to see Mayra Reel NP for a refill on his medication. Patient stated that he tried to schedule an appointment with Mayra Reel NP and was told that she does not have anything available for a couple of weeks, Can he be worked in.

## 2021-02-01 NOTE — Telephone Encounter (Signed)
I will defer to PCP.  Thanks.  

## 2021-02-02 NOTE — Telephone Encounter (Signed)
Pt scheduled appt on 4/20. He states he has enough amlodipine for now until his appointment.

## 2021-02-10 ENCOUNTER — Ambulatory Visit: Payer: Medicaid Other | Admitting: Primary Care

## 2021-03-30 ENCOUNTER — Other Ambulatory Visit: Payer: Self-pay | Admitting: Primary Care

## 2021-03-30 DIAGNOSIS — I10 Essential (primary) hypertension: Secondary | ICD-10-CM

## 2021-03-30 NOTE — Telephone Encounter (Signed)
Patient has not been physically seen in the office since 2020, he will need to be seen in the office for further refills. I believe he was scheduled at [facility], but has canceled.  Needs to be seen in person for further refills moving forward.

## 2021-03-31 NOTE — Telephone Encounter (Signed)
Left message to return call to our office.  

## 2021-04-08 NOTE — Telephone Encounter (Signed)
Left message to return call to our office.  

## 2021-04-13 NOTE — Telephone Encounter (Signed)
Noted. Okay to send letter too.

## 2021-05-06 NOTE — Telephone Encounter (Signed)
Letter mailed

## 2021-05-09 ENCOUNTER — Encounter (INDEPENDENT_AMBULATORY_CARE_PROVIDER_SITE_OTHER): Payer: Self-pay

## 2021-05-10 NOTE — Telephone Encounter (Signed)
Called l/m to call office. Needs to be seen.

## 2021-05-12 NOTE — Telephone Encounter (Signed)
Left message to return call to our office.  

## 2021-05-21 NOTE — Telephone Encounter (Signed)
Letter was mailed to the patient by Joellen on 05/06/21 also

## 2021-06-22 ENCOUNTER — Other Ambulatory Visit: Payer: Self-pay | Admitting: Primary Care

## 2021-06-22 DIAGNOSIS — I10 Essential (primary) hypertension: Secondary | ICD-10-CM

## 2021-07-20 ENCOUNTER — Encounter: Payer: Self-pay | Admitting: Primary Care

## 2021-07-20 ENCOUNTER — Ambulatory Visit (INDEPENDENT_AMBULATORY_CARE_PROVIDER_SITE_OTHER): Payer: Self-pay | Admitting: Primary Care

## 2021-07-20 ENCOUNTER — Other Ambulatory Visit: Payer: Self-pay

## 2021-07-20 VITALS — BP 126/88 | HR 90 | Temp 98.1°F | Ht 68.0 in | Wt 175.0 lb

## 2021-07-20 DIAGNOSIS — Z9889 Other specified postprocedural states: Secondary | ICD-10-CM

## 2021-07-20 DIAGNOSIS — G43709 Chronic migraine without aura, not intractable, without status migrainosus: Secondary | ICD-10-CM

## 2021-07-20 DIAGNOSIS — Z23 Encounter for immunization: Secondary | ICD-10-CM

## 2021-07-20 DIAGNOSIS — I10 Essential (primary) hypertension: Secondary | ICD-10-CM

## 2021-07-20 DIAGNOSIS — K219 Gastro-esophageal reflux disease without esophagitis: Secondary | ICD-10-CM

## 2021-07-20 DIAGNOSIS — J4530 Mild persistent asthma, uncomplicated: Secondary | ICD-10-CM

## 2021-07-20 LAB — COMPREHENSIVE METABOLIC PANEL
ALT: 21 U/L (ref 0–53)
AST: 22 U/L (ref 0–37)
Albumin: 4.4 g/dL (ref 3.5–5.2)
Alkaline Phosphatase: 54 U/L (ref 39–117)
BUN: 7 mg/dL (ref 6–23)
CO2: 31 mEq/L (ref 19–32)
Calcium: 9.6 mg/dL (ref 8.4–10.5)
Chloride: 102 mEq/L (ref 96–112)
Creatinine, Ser: 0.98 mg/dL (ref 0.40–1.50)
GFR: 93.92 mL/min (ref >60.00)
Glucose, Bld: 105 mg/dL — ABNORMAL HIGH (ref 70–99)
Potassium: 3.7 mEq/L (ref 3.5–5.1)
Sodium: 140 mEq/L (ref 135–145)
Total Bilirubin: 1.1 mg/dL (ref 0.2–1.2)
Total Protein: 7.1 g/dL (ref 6.0–8.3)

## 2021-07-20 LAB — CBC
HCT: 43.9 % (ref 39.0–52.0)
Hemoglobin: 14.5 g/dL (ref 13.0–17.0)
MCHC: 33.1 g/dL (ref 30.0–36.0)
MCV: 91.1 fl (ref 78.0–100.0)
Platelets: 274 10*3/uL (ref 150.0–400.0)
RBC: 4.82 Mil/uL (ref 4.22–5.81)
RDW: 13.4 % (ref 11.5–15.5)
WBC: 7 10*3/uL (ref 4.0–10.5)

## 2021-07-20 LAB — LIPID PANEL
Cholesterol: 160 mg/dL (ref 0–200)
HDL: 62.3 mg/dL (ref >39.00)
LDL Cholesterol: 84 mg/dL (ref 0–99)
NonHDL: 98.08
Total CHOL/HDL Ratio: 3
Triglycerides: 71 mg/dL (ref 0.0–149.0)
VLDL: 14.2 mg/dL (ref 0.0–40.0)

## 2021-07-20 MED ORDER — LOSARTAN POTASSIUM 50 MG PO TABS: ORAL_TABLET | ORAL | 3 refills | Status: AC

## 2021-07-20 MED ORDER — AMLODIPINE BESYLATE 10 MG PO TABS: 10.0000 mg | ORAL_TABLET | Freq: Every day | ORAL | 3 refills | Status: AC

## 2021-07-20 NOTE — Assessment & Plan Note (Signed)
Following with pain management. Continue celebrex 100 mg BID and gabapentin 600 mg TID.

## 2021-07-20 NOTE — Patient Instructions (Signed)
Stop by the lab prior to leaving today. I will notify you of your results once received.   It was a pleasure to see you today!  

## 2021-07-20 NOTE — Assessment & Plan Note (Signed)
Controlled.  Continue taking amlodipine 10 mg and losartan 50 mg.  CMP pending.

## 2021-07-20 NOTE — Progress Notes (Signed)
Subjective:    Patient ID: JAQUAVIOUS Khan, male    DOB: 1976-03-22, 45 y.o.   MRN: [id number]  HPI  Zachary Khan is a very pleasant 45 y.o. male with a history of migraines, hypertension, chronic neck and back pain, insomnia who presents today for follow up of chronic conditions.  Managed on amlodipine 10 mg and losartan 50 mg for hypertension. He has been out of amlodipine 10 mg for four days. He checks his BP at home which runs 120's-130/80's.   He denies headaches, dizziness.   Following with pain management for chronic right shoulder pain. History of surgical repair of right shoulder in 2021. Managed on Celebrex 100 mg BID and Gabapentin 600 mg TID. Overall about the same. Remains out of work.   No migraines since hypertension has been controlled.   BP Readings from Last 3 Encounters:  07/20/21 126/88  07/08/20 120/75  12/22/19 122/90        Review of Systems  Respiratory:  Negative for shortness of breath.   Cardiovascular:  Negative for chest pain.  Neurological:  Negative for dizziness and headaches.        Past Medical History:  Diagnosis Date   Asthma    AS A CHILD-NO INHALERS   Cervical spine degeneration    Chicken pox    Cholelithiasis and acute cholecystitis without obstruction 12/08/2017   Chronic back pain    Frequent headaches    GERD (gastroesophageal reflux disease)    Migraine     Social History   Socioeconomic History   Marital status: Single    Spouse name: Not on file   Number of children: Not on file   Years of education: Not on file   Highest education level: Not on file  Occupational History   Not on file  Tobacco Use   Smoking status: Every Day    Packs/day: 0.25    Years: 20.00    Pack years: 5.00    Types: Cigarettes   Smokeless tobacco: Never  Vaping Use   Vaping Use: Never used  Substance and Sexual Activity   Alcohol use: Yes    Comment: 2 BEERS ON WEEKEND   Drug use: Yes    Types: Marijuana   Sexual activity: Not  on file  Other Topics Concern   Not on file  Social History Narrative   Single.   4 children.   Works as a Retail banker.   Highest level of education: GED.   Social Determinants of Health   Financial Resource Strain: Not on file  Food Insecurity: Not on file  Transportation Needs: Not on file  Physical Activity: Not on file  Stress: Not on file  Social Connections: Not on file  Intimate Partner Violence: Not on file    Past Surgical History:  Procedure Laterality Date   CHOLECYSTECTOMY N/A 11/28/2017   Procedure: LAPAROSCOPIC CHOLECYSTECTOMY;  Surgeon: Ancil Linsey, MD;  Location: [facility] ORS;  Service: General;  Laterality: N/A;   IRRIGATION AND DEBRIDEMENT ABSCESS Left 08/06/2019   Procedure: IRRIGATION AND DEBRIDEMENT ABSCESS Left Lower Leg;  Surgeon: Leafy Ro, MD;  Location: [facility] ORS;  Service: General;  Laterality: Left;   NO PAST SURGERIES     TOTAL SHOULDER ARTHROPLASTY Right 06/17/2020    Family History  Problem Relation Age of Onset   Arthritis Mother    Heart disease Mother    Hypertension Mother    Alcohol abuse Father    ALS Father  Rheum arthritis Maternal Grandmother     Allergies  Allergen Reactions   Hydrocodone    Hydrocodone-Acetaminophen Hives   Morphine    Sulfa Antibiotics Hives   Sulfasalazine Hives   Vicodin [Hydrocodone-Acetaminophen] Hives    Current Outpatient Medications on File Prior to Visit  Medication Sig Dispense Refill   famotidine (PEPCID) 20 MG tablet Take 1 tablet (20 mg total) by mouth 2 (two) times daily. (Patient taking differently: Take 40 mg by mouth daily.) 60 tablet 0   gabapentin (NEURONTIN) 600 MG tablet gabapentin 600 mg tablet  Take 1 tablet 3 times a day by oral route for 30 days.     losartan (COZAAR) 50 MG tablet TAKE 1 TABLET BY MOUTH ONCE DAILY FOR BLOOD PRESSURE. Office visit required for further refills, final refill. 30 tablet 0   Multiple Vitamin (MULTIVITAMIN WITH MINERALS) TABS tablet Take  1 tablet by mouth daily.     SUMAtriptan (IMITREX) 100 MG tablet Take 1 tablet at migraine onset. May repeat in 2 hours if no resolve. Do not exceed 2 tablets in 24 hours. 10 tablet 0   amLODipine (NORVASC) 10 MG tablet Take 1 tablet (10 mg total) by mouth daily. For blood pressure. (Patient not taking: Reported on 07/20/2021) 30 tablet 0   celecoxib (CELEBREX) 100 MG capsule Take 1 capsule by mouth in the morning and at bedtime.     [DISCONTINUED] traZODone (DESYREL) 100 MG tablet TAKE 1 TABLET(100 MG) BY MOUTH AT BEDTIME FOR SLEEP 90 tablet 1   [DISCONTINUED] VENTOLIN HFA 108 (90 Base) MCG/ACT inhaler INHALE 2 PUFFS INTO THE LUNGS EVERY 6 HOURS AS NEEDED FOR WHEEZING OR SHORTNESS OF BREATH 18 g 0   No current facility-administered medications on file prior to visit.    BP 126/88   Pulse 90   Temp 98.1 F (36.7 C) (Temporal)   Ht 5\' 8"  (1.727 m)   Wt 175 lb (79.4 kg)   SpO2 97%   BMI 26.61 kg/m  Objective:   Physical Exam Cardiovascular:     Rate and Rhythm: Normal rate and regular rhythm.  Pulmonary:     Effort: Pulmonary effort is normal.     Breath sounds: Normal breath sounds. No wheezing or rales.  Musculoskeletal:     Cervical back: Neck supple.  Skin:    General: Skin is warm and dry.  Neurological:     Mental Status: He is alert and oriented to person, place, and time.          Assessment & Plan:      This visit occurred during the SARS-CoV-2 public health emergency.  Safety protocols were in place, including screening questions prior to the visit, additional usage of staff PPE, and extensive cleaning of exam room while observing appropriate contact time as indicated for disinfecting solutions.

## 2021-07-20 NOTE — Assessment & Plan Note (Signed)
No recent migraines. Continue to monitor.  

## 2021-07-20 NOTE — Assessment & Plan Note (Signed)
Denies concerns.   No wheezing on exam.

## 2021-07-20 NOTE — Assessment & Plan Note (Signed)
Doing well on Pepcid 20 mg, continue same.  

## 2021-07-21 ENCOUNTER — Other Ambulatory Visit (INDEPENDENT_AMBULATORY_CARE_PROVIDER_SITE_OTHER): Payer: 59

## 2021-07-21 DIAGNOSIS — Z23 Encounter for immunization: Secondary | ICD-10-CM

## 2021-07-21 DIAGNOSIS — R7309 Other abnormal glucose: Secondary | ICD-10-CM

## 2021-07-21 LAB — HEMOGLOBIN A1C: Hgb A1c MFr Bld: 5.5 % (ref 4.6–6.5)

## 2021-10-25 DIAGNOSIS — Z20822 Contact with and (suspected) exposure to covid-19: Secondary | ICD-10-CM | POA: Diagnosis not present

## 2021-10-26 DIAGNOSIS — Z20822 Contact with and (suspected) exposure to covid-19: Secondary | ICD-10-CM | POA: Diagnosis not present

## 2021-10-27 DIAGNOSIS — Z20822 Contact with and (suspected) exposure to covid-19: Secondary | ICD-10-CM | POA: Diagnosis not present

## 2023-07-07 ENCOUNTER — Emergency Department
Admission: EM | Admit: 2023-07-07 | Discharge: 2023-07-07 | Disposition: A | Discharge status: Home / Self Care | Payer: Self-pay | Attending: Emergency Medicine | Admitting: Emergency Medicine

## 2023-07-07 ENCOUNTER — Other Ambulatory Visit: Payer: Self-pay

## 2023-07-07 ENCOUNTER — Emergency Department: Payer: Self-pay

## 2023-07-07 DIAGNOSIS — J45909 Unspecified asthma, uncomplicated: Secondary | ICD-10-CM | POA: Insufficient documentation

## 2023-07-07 DIAGNOSIS — X501XXA Overexertion from prolonged static or awkward postures, initial encounter: Secondary | ICD-10-CM | POA: Insufficient documentation

## 2023-07-07 DIAGNOSIS — S93401A Sprain of unspecified ligament of right ankle, initial encounter: Secondary | ICD-10-CM | POA: Insufficient documentation

## 2023-07-07 DIAGNOSIS — Y92007 Garden or yard of unspecified non-institutional (private) residence as the place of occurrence of the external cause: Secondary | ICD-10-CM | POA: Insufficient documentation

## 2023-07-07 DIAGNOSIS — L0390 Cellulitis, unspecified: Secondary | ICD-10-CM

## 2023-07-07 DIAGNOSIS — L03115 Cellulitis of right lower limb: Secondary | ICD-10-CM | POA: Insufficient documentation

## 2023-07-07 MED ORDER — MELOXICAM 15 MG PO TABS
15.0000 mg | ORAL_TABLET | Freq: Every day | ORAL | 0 refills | Status: AC
  Filled 2023-07-07: qty 30, 30d supply, fill #0

## 2023-07-07 MED ORDER — CEPHALEXIN 500 MG PO CAPS
500.0000 mg | ORAL_CAPSULE | Freq: Four times a day (QID) | ORAL | 0 refills | Status: AC
  Filled 2023-07-07: qty 40, 10d supply, fill #0

## 2023-07-07 MED ORDER — OXYCODONE-ACETAMINOPHEN 5-325 MG PO TABS
1.0000 | ORAL_TABLET | ORAL | 0 refills | Status: AC | PRN
  Filled 2023-07-07: qty 10, 2d supply, fill #0

## 2023-07-07 MED ORDER — FENTANYL CITRATE PF 50 MCG/ML IJ SOSY
50.0000 ug | PREFILLED_SYRINGE | Freq: Once | INTRAMUSCULAR | Status: AC
Start: 2023-07-07 — End: 2023-07-07
  Administered 2023-07-07: 50 ug via INTRAMUSCULAR
  Filled 2023-07-07: qty 1

## 2023-07-07 NOTE — ED Notes (Signed)
See triage note  Presents with pain and swelling to right ankle  States he stepped in a hole 2 weeks ago  Also noted a small blister to lateral heel area  first  Then the pain and swelling became worse

## 2023-07-07 NOTE — ED Triage Notes (Addendum)
Pt comes with c/o right ankle pain. Pt states he did step in a hole in his yard a few days ago. Pt states it didn't start hurting until after that a few days later. Pt states it is swollen and painful and does have blister.

## 2023-07-07 NOTE — ED Provider Notes (Signed)
[facility] Provider Note    Event Date/Time   First MD Initiated Contact with Patient 07/07/23 [phone]     (approximate)   History   Ankle Pain   HPI  Zachary Khan is a 47 y.o. male with history of migraines, chronic back pain and asthma presents emergency department with right ankle injury.  Patient states that about 2 days ago he stepped in a hole in his yard after coming down off a ladder.  States severe pain in the right ankle and now has a large blister along side.  States the swelling has gotten worse.  Unable to bear weight.  No numbness or tingling.  No other injuries reported.      Physical Exam   Triage Vital Signs: ED Triage Vitals  Encounter Vitals Group     BP 07/07/23 0756 (!) 169/107     Systolic BP Percentile --      Diastolic BP Percentile --      Pulse Rate 07/07/23 0756 93     Resp 07/07/23 0756 18     Temp 07/07/23 0756 98.4 F (36.9 C)     Temp src --      SpO2 07/07/23 0756 100 %     Weight 07/07/23 0808 175 lb 0.7 oz (79.4 kg)     Height 07/07/23 0808 5\' 8"  (1.727 m)     Head Circumference --      Peak Flow --      Pain Score --      Pain Loc --      Pain Education --      Exclude from Growth Chart --     Most recent vital signs: Vitals:   07/07/23 0756  BP: (!) 169/107  Pulse: 93  Resp: 18  Temp: 98.4 F (36.9 C)  SpO2: 100%     General: Awake, no distress.   CV:  Good peripheral perfusion. regular rate and  rhythm Resp:  Normal effort.  Abd:  No distention.   Other:  Right ankle is grossly swollen around the lateral malleolus, large blister noted along the lateral aspect of the heel, neurovascular is intact, decreased range of motion secondary to pain   ED Results / Procedures / Treatments   Labs (all labs ordered are listed, but only abnormal results are displayed) Labs Reviewed - No data to display   EKG     RADIOLOGY X-ray of the right  ankle    PROCEDURES:   Procedures   MEDICATIONS ORDERED IN ED: Medications  fentaNYL (SUBLIMAZE) injection 50 mcg (50 mcg Intramuscular Given 07/07/23 0816)     IMPRESSION / MDM / ASSESSMENT AND PLAN / ED COURSE  I reviewed the triage vital signs and the nursing notes.                              Differential diagnosis includes, but is not limited to, sprain, fracture, contusion  Patient's presentation is most consistent with acute illness / injury with system symptoms.   X-ray of the right ankle Fentanyl 50 mcg IM   X-ray of the right ankle independently reviewed interpreted by me as having soft tissue swelling but no fracture.  Confirmed by radiology  Due to the redness along the area with the blister did start him on antibiotic, he was wrapped in Ace wrap and put in a cam boot along with crutches.  He is to follow-up with  orthopedics.  Return emergency department worsening.  Patient is in agreement treatment plan.  Discharged stable condition.   FINAL CLINICAL IMPRESSION(S) / ED DIAGNOSES   Final diagnoses:  Sprain of right ankle, unspecified ligament, initial encounter  Cellulitis, unspecified cellulitis site     Rx / DC Orders   ED Discharge Orders          Ordered    cephALEXin (KEFLEX) 500 MG capsule  4 times daily        07/07/23 0904    meloxicam (MOBIC) 15 MG tablet  Daily        07/07/23 0904    oxyCODONE-acetaminophen (PERCOCET) 5-325 MG tablet  Every 4 hours PRN        07/07/23 1610             Note:  This document was prepared using Dragon voice recognition software and may include unintentional dictation errors.    Faythe Ghee, PA-C 07/07/23 1152    Corena Herter, MD 07/07/23 [phone]

## 2023-08-09 ENCOUNTER — Telehealth: Payer: Self-pay

## 2023-08-09 NOTE — Telephone Encounter (Signed)
Transition Care Management Unsuccessful Follow-up Telephone Call  Date of discharge and from where:  07/07/2023 [facility]  Attempts:  1st Attempt  Reason for unsuccessful TCM follow-up call:  Unable to leave message  Minda Faas [facility]  [facility], [facility] Guide Direct Dial: [phone]  Website: [email or web address]

## 2023-08-10 ENCOUNTER — Telehealth: Payer: Self-pay

## 2023-08-10 NOTE — Telephone Encounter (Signed)
Transition Care Management Unsuccessful Follow-up Telephone Call  Date of discharge and from where:  07/07/2023 [facility].  Attempts:  2nd Attempt  Reason for unsuccessful TCM follow-up call:  Missing or invalid number  Laine Giovanetti [facility]  [facility], [facility] Guide Direct Dial: [phone]  Website: [email or web address]

## 2024-04-22 ENCOUNTER — Other Ambulatory Visit (HOSPITAL_BASED_OUTPATIENT_CLINIC_OR_DEPARTMENT_OTHER): Payer: Self-pay
